# Patient Record
Sex: Female | Born: 1940 | Race: White | Hispanic: No | Marital: Married | Smoking: Never smoker
Health system: Southern US, Community
[De-identification: ages and names within clinical notes are randomized; demographics above are authoritative.]

## PROBLEM LIST (undated history)

## (undated) DIAGNOSIS — T4145XA Adverse effect of unspecified anesthetic, initial encounter: Secondary | ICD-10-CM

## (undated) DIAGNOSIS — Z923 Personal history of irradiation: Secondary | ICD-10-CM

## (undated) DIAGNOSIS — K219 Gastro-esophageal reflux disease without esophagitis: Secondary | ICD-10-CM

## (undated) DIAGNOSIS — M1990 Unspecified osteoarthritis, unspecified site: Secondary | ICD-10-CM

## (undated) DIAGNOSIS — M810 Age-related osteoporosis without current pathological fracture: Secondary | ICD-10-CM

## (undated) DIAGNOSIS — E8940 Asymptomatic postprocedural ovarian failure: Secondary | ICD-10-CM

## (undated) DIAGNOSIS — C50919 Malignant neoplasm of unspecified site of unspecified female breast: Secondary | ICD-10-CM

## (undated) DIAGNOSIS — I509 Heart failure, unspecified: Secondary | ICD-10-CM

## (undated) DIAGNOSIS — I499 Cardiac arrhythmia, unspecified: Secondary | ICD-10-CM

## (undated) DIAGNOSIS — M419 Scoliosis, unspecified: Secondary | ICD-10-CM

## (undated) DIAGNOSIS — C801 Malignant (primary) neoplasm, unspecified: Secondary | ICD-10-CM

## (undated) DIAGNOSIS — Z8719 Personal history of other diseases of the digestive system: Secondary | ICD-10-CM

## (undated) DIAGNOSIS — M8008XA Age-related osteoporosis with current pathological fracture, vertebra(e), initial encounter for fracture: Secondary | ICD-10-CM

## (undated) DIAGNOSIS — Z9889 Other specified postprocedural states: Secondary | ICD-10-CM

## (undated) DIAGNOSIS — I341 Nonrheumatic mitral (valve) prolapse: Secondary | ICD-10-CM

## (undated) DIAGNOSIS — I519 Heart disease, unspecified: Secondary | ICD-10-CM

## (undated) DIAGNOSIS — Z853 Personal history of malignant neoplasm of breast: Secondary | ICD-10-CM

## (undated) DIAGNOSIS — T8859XA Other complications of anesthesia, initial encounter: Secondary | ICD-10-CM

## (undated) DIAGNOSIS — F419 Anxiety disorder, unspecified: Secondary | ICD-10-CM

## (undated) DIAGNOSIS — R0600 Dyspnea, unspecified: Secondary | ICD-10-CM

## (undated) DIAGNOSIS — K635 Polyp of colon: Secondary | ICD-10-CM

## (undated) DIAGNOSIS — R112 Nausea with vomiting, unspecified: Secondary | ICD-10-CM

## (undated) HISTORY — DX: Asymptomatic postprocedural ovarian failure: E89.40

## (undated) HISTORY — DX: Cardiac arrhythmia, unspecified: I49.9

## (undated) HISTORY — PX: NASAL SEPTUM SURGERY: SHX37

## (undated) HISTORY — PX: OTHER SURGICAL HISTORY: SHX169

## (undated) HISTORY — DX: Age-related osteoporosis without current pathological fracture: M81.0

## (undated) HISTORY — DX: Heart disease, unspecified: I51.9

## (undated) HISTORY — PX: TOTAL ABDOMINAL HYSTERECTOMY W/ BILATERAL SALPINGOOPHORECTOMY: SHX83

## (undated) HISTORY — PX: CHOLECYSTECTOMY: SHX55

## (undated) HISTORY — DX: Polyp of colon: K63.5

## (undated) HISTORY — DX: Heart failure, unspecified: I50.9

## (undated) HISTORY — PX: EYE SURGERY: SHX253

## (undated) HISTORY — PX: TONSILLECTOMY: SUR1361

## (undated) HISTORY — PX: ROTATOR CUFF REPAIR: SHX139

## (undated) HISTORY — DX: Personal history of other diseases of the digestive system: Z87.19

## (undated) HISTORY — PX: CATARACT EXTRACTION: SUR2

## (undated) HISTORY — DX: Personal history of malignant neoplasm of breast: Z85.3

---
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
## 1999-12-15 HISTORY — PX: COLONOSCOPY: SHX174

## 2001-04-29 DIAGNOSIS — Z8711 Personal history of peptic ulcer disease: Secondary | ICD-10-CM

## 2001-04-29 HISTORY — DX: Personal history of peptic ulcer disease: Z87.11

## 2003-11-18 ENCOUNTER — Ambulatory Visit: Payer: Self-pay | Admitting: Internal Medicine

## 2004-01-06 ENCOUNTER — Ambulatory Visit: Payer: Self-pay | Admitting: Unknown Physician Specialty

## 2004-01-07 ENCOUNTER — Ambulatory Visit: Payer: Self-pay | Admitting: Unknown Physician Specialty

## 2004-01-10 ENCOUNTER — Ambulatory Visit: Payer: Self-pay | Admitting: Unknown Physician Specialty

## 2004-01-30 DIAGNOSIS — C50919 Malignant neoplasm of unspecified site of unspecified female breast: Secondary | ICD-10-CM

## 2004-01-30 DIAGNOSIS — C801 Malignant (primary) neoplasm, unspecified: Secondary | ICD-10-CM

## 2004-01-30 DIAGNOSIS — Z923 Personal history of irradiation: Secondary | ICD-10-CM

## 2004-01-30 HISTORY — DX: Malignant neoplasm of unspecified site of unspecified female breast: C50.919

## 2004-01-30 HISTORY — PX: BREAST LUMPECTOMY: SHX2

## 2004-01-30 HISTORY — PX: BREAST BIOPSY: SHX20

## 2004-01-30 HISTORY — DX: Personal history of irradiation: Z92.3

## 2004-01-30 HISTORY — DX: Malignant (primary) neoplasm, unspecified: C80.1

## 2004-05-26 ENCOUNTER — Ambulatory Visit: Payer: Self-pay | Admitting: Unknown Physician Specialty

## 2004-06-06 ENCOUNTER — Ambulatory Visit: Payer: Self-pay | Admitting: Unknown Physician Specialty

## 2004-06-15 ENCOUNTER — Ambulatory Visit: Payer: Self-pay | Admitting: Surgery

## 2004-06-15 ENCOUNTER — Other Ambulatory Visit: Payer: Self-pay

## 2004-06-21 ENCOUNTER — Ambulatory Visit: Payer: Self-pay | Admitting: Surgery

## 2004-10-23 ENCOUNTER — Ambulatory Visit: Payer: Self-pay | Admitting: Orthopaedic Surgery

## 2004-11-29 ENCOUNTER — Ambulatory Visit: Payer: Self-pay | Admitting: Unknown Physician Specialty

## 2004-11-29 HISTORY — PX: COLONOSCOPY: SHX174

## 2004-12-05 ENCOUNTER — Ambulatory Visit: Payer: Self-pay | Admitting: Orthopaedic Surgery

## 2004-12-25 ENCOUNTER — Ambulatory Visit: Payer: Self-pay | Admitting: Internal Medicine

## 2005-01-08 ENCOUNTER — Ambulatory Visit: Payer: Self-pay | Admitting: Internal Medicine

## 2005-02-22 ENCOUNTER — Ambulatory Visit: Payer: Self-pay | Admitting: Surgery

## 2005-03-16 ENCOUNTER — Ambulatory Visit: Payer: Self-pay | Admitting: Internal Medicine

## 2005-03-29 ENCOUNTER — Ambulatory Visit: Payer: Self-pay | Admitting: Internal Medicine

## 2005-04-29 ENCOUNTER — Ambulatory Visit: Payer: Self-pay | Admitting: Internal Medicine

## 2005-05-29 ENCOUNTER — Ambulatory Visit: Payer: Self-pay | Admitting: Internal Medicine

## 2005-07-11 ENCOUNTER — Ambulatory Visit: Payer: Self-pay | Admitting: Internal Medicine

## 2005-11-14 ENCOUNTER — Ambulatory Visit: Payer: Self-pay | Admitting: Radiation Oncology

## 2006-01-07 ENCOUNTER — Ambulatory Visit: Payer: Self-pay | Admitting: Internal Medicine

## 2006-01-09 ENCOUNTER — Ambulatory Visit: Payer: Self-pay | Admitting: Surgery

## 2006-01-29 ENCOUNTER — Ambulatory Visit: Payer: Self-pay | Admitting: Internal Medicine

## 2006-06-30 ENCOUNTER — Ambulatory Visit: Payer: Self-pay | Admitting: Internal Medicine

## 2006-07-05 ENCOUNTER — Ambulatory Visit: Payer: Self-pay | Admitting: Internal Medicine

## 2006-07-30 ENCOUNTER — Ambulatory Visit: Payer: Self-pay | Admitting: Internal Medicine

## 2006-11-26 ENCOUNTER — Ambulatory Visit: Payer: Self-pay | Admitting: Unknown Physician Specialty

## 2006-12-24 ENCOUNTER — Ambulatory Visit: Payer: Self-pay | Admitting: Unknown Physician Specialty

## 2006-12-24 HISTORY — PX: ESOPHAGOGASTRODUODENOSCOPY: SHX1529

## 2007-01-27 ENCOUNTER — Ambulatory Visit: Payer: Self-pay | Admitting: Surgery

## 2007-01-30 ENCOUNTER — Ambulatory Visit: Payer: Self-pay | Admitting: Internal Medicine

## 2007-02-14 ENCOUNTER — Ambulatory Visit: Payer: Self-pay | Admitting: Internal Medicine

## 2007-03-02 ENCOUNTER — Ambulatory Visit: Payer: Self-pay | Admitting: Internal Medicine

## 2007-07-30 ENCOUNTER — Ambulatory Visit: Payer: Self-pay | Admitting: Internal Medicine

## 2007-08-15 ENCOUNTER — Ambulatory Visit: Payer: Self-pay | Admitting: Internal Medicine

## 2007-08-30 ENCOUNTER — Ambulatory Visit: Payer: Self-pay | Admitting: Internal Medicine

## 2008-02-03 ENCOUNTER — Ambulatory Visit: Payer: Self-pay | Admitting: Surgery

## 2008-07-29 ENCOUNTER — Ambulatory Visit: Payer: Self-pay | Admitting: Internal Medicine

## 2008-08-13 ENCOUNTER — Ambulatory Visit: Payer: Self-pay | Admitting: Internal Medicine

## 2008-08-29 ENCOUNTER — Ambulatory Visit: Payer: Self-pay | Admitting: Internal Medicine

## 2008-12-21 ENCOUNTER — Ambulatory Visit: Payer: Self-pay | Admitting: Internal Medicine

## 2008-12-29 ENCOUNTER — Ambulatory Visit: Payer: Self-pay | Admitting: Internal Medicine

## 2009-02-07 ENCOUNTER — Ambulatory Visit: Payer: Self-pay | Admitting: Surgery

## 2009-07-29 ENCOUNTER — Ambulatory Visit: Payer: Self-pay | Admitting: Internal Medicine

## 2009-08-12 ENCOUNTER — Ambulatory Visit: Payer: Self-pay | Admitting: Internal Medicine

## 2009-08-29 ENCOUNTER — Ambulatory Visit: Payer: Self-pay | Admitting: Internal Medicine

## 2010-02-08 ENCOUNTER — Ambulatory Visit: Payer: Self-pay | Admitting: Surgery

## 2010-03-14 ENCOUNTER — Ambulatory Visit: Payer: Self-pay | Admitting: Unknown Physician Specialty

## 2010-03-14 HISTORY — PX: COLONOSCOPY: SHX174

## 2010-08-15 ENCOUNTER — Ambulatory Visit: Payer: Self-pay | Admitting: Internal Medicine

## 2010-08-30 ENCOUNTER — Ambulatory Visit: Payer: Self-pay | Admitting: Internal Medicine

## 2011-02-12 ENCOUNTER — Ambulatory Visit: Payer: Self-pay | Admitting: Surgery

## 2012-02-18 ENCOUNTER — Ambulatory Visit: Payer: Self-pay | Admitting: Internal Medicine

## 2012-02-20 ENCOUNTER — Ambulatory Visit: Payer: Self-pay | Admitting: Internal Medicine

## 2012-03-12 ENCOUNTER — Ambulatory Visit: Payer: Self-pay | Admitting: Ophthalmology

## 2012-05-06 ENCOUNTER — Ambulatory Visit: Payer: Self-pay | Admitting: Ophthalmology

## 2013-02-23 ENCOUNTER — Ambulatory Visit: Payer: Self-pay | Admitting: Internal Medicine

## 2013-02-24 ENCOUNTER — Ambulatory Visit: Payer: Self-pay | Admitting: Ophthalmology

## 2013-02-24 LAB — POTASSIUM: Potassium: 3.8 mmol/L (ref 3.5–5.1)

## 2013-03-10 ENCOUNTER — Ambulatory Visit: Payer: Self-pay | Admitting: Ophthalmology

## 2014-03-01 ENCOUNTER — Ambulatory Visit: Payer: Self-pay | Admitting: Unknown Physician Specialty

## 2014-03-11 ENCOUNTER — Ambulatory Visit: Payer: Self-pay | Admitting: Internal Medicine

## 2014-05-21 NOTE — Op Note (Signed)
PATIENT NAME:  Katie Harding, Katie Harding MR#:  [id number] DATE OF BIRTH:  11-07-1940  DATE OF PROCEDURE:  05/06/2012  PREOPERATIVE DIAGNOSIS: Visually significant cataract of the right eye.   POSTOPERATIVE DIAGNOSIS: Visually significant cataract of the right eye.   PROCEDURE PERFORMED: Cataract extraction by phacoemulsification with implant of intraocular lens to the right eye.   SURGEON: Birder Robson, MD   ANESTHESIA: Topical anesthesia.   COMPLICATIONS: None.   PROCEDURE IN DETAIL: The patient was examined, consented for this procedure in the preoperative holding area, and then brought back to the operating room, where the anesthesia team employed topical anesthesia.  The right eye was prepped and draped in the usual sterile ophthalmic fashion.   A side-port blade was used to create a paracentesis, and the anterior chamber was filled with DisCoVisc. A near-clear corneal incision was created, followed by a continuous curvilinear capsulorrhexis using the cystotome and capsulorrhexis forceps. Hydrodissection and hydrodelineation were carried out with BSS on a blunt cannula. The lens was removed in a  4-quadrant chop-and-stop technique. The remaining cortical material was removed with the irrigation-aspiration handpiece. The capsular bag was inflated with the viscoelastic, and the Tecnis ZCB00 21.0-diopter lens, serial #[id number] was placed in the right eye. The remaining viscoelastic was removed from the right eye. Then 0.1 mL of cefuroxime at a concentration if 10 mg/mL was placed in the anterior chamber. The wounds were hydrated and found to be watertight. The right eye was inflated to a physiologic pressure.   The patient was awakened from topical anesthesia without complication and instructed to begin her regimen of drops that afternoon, and follow up with me in one day.    ____________________________ Livingston Diones. Rachit Grim, MD wlp:dm D: 05/06/2012 12:36:00 ET T: 05/06/2012 13:14:55  ET JOB#: [id number]  cc: Lavaris Sexson L. Rekha Hobbins, MD, <Dictator> Livingston Diones Tyshawn Keel MD ELECTRONICALLY SIGNED 05/13/2012 12:51

## 2014-05-22 NOTE — Op Note (Signed)
PATIENT NAME:  Katie Harding, Katie Harding MR#:  [id number] DATE OF BIRTH:  03-30-1940  DATE OF PROCEDURE:  03/10/2013  PREOPERATIVE DIAGNOSIS: Visually significant cataract of the left eye.   POSTOPERATIVE DIAGNOSIS: Visually significant cataract of the left eye.   OPERATIVE PROCEDURE: Cataract extraction by phacoemulsification with implant of intraocular lens to the left eye.   SURGEON: Birder Robson, MD.   ANESTHESIA:  1. Managed anesthesia care.  2. Topical tetracaine drops followed by 2% Xylocaine jelly applied in the preoperative holding area.   COMPLICATIONS: None.   TECHNIQUE:  Stop and chop.  DESCRIPTION OF PROCEDURE: The patient was examined and consented in the preoperative holding area where the aforementioned topical anesthesia was applied to the left eye and then brought back to the Operating Room where the left eye was prepped and draped in the usual sterile ophthalmic fashion and a lid speculum was placed. A paracentesis was created with the side port blade and the anterior chamber was filled with viscoelastic. A near clear corneal incision was performed with the steel keratome. A continuous curvilinear capsulorrhexis was performed with a cystotome followed by the capsulorrhexis forceps. Hydrodissection and hydrodelineation were carried out with BSS on a blunt cannula. The lens was removed in a stop and chop technique and the remaining cortical material was removed with the irrigation-aspiration handpiece. The capsular bag was inflated with viscoelastic and the Tecnis ZCB00 22.0-diopter lens, serial number [id number] was placed in the capsular bag without complication. The remaining viscoelastic was removed from the eye with the irrigation-aspiration handpiece. The wounds were hydrated. The anterior chamber was flushed with Miostat and the eye was inflated to physiologic pressure. 0.1 mL of cefuroxime concentration 10 mg/mL was placed in the anterior chamber. The wounds were found to be  water tight. The eye was dressed with Vigamox. The patient was given protective glasses to wear throughout the day and a shield with which to sleep tonight. The patient was also given drops with which to begin a drop regimen today and will follow-up with me in one day.    ____________________________ Livingston Diones. Zyere Jiminez, MD wlp:dmm D: 03/10/2013 20:52:59 ET T: 03/10/2013 21:27:43 ET JOB#: [id number]  cc: Dagan Heinz L. Jaidon Ellery, MD, <Dictator> Livingston Diones Jaspreet Bodner MD ELECTRONICALLY SIGNED 03/19/2013 11:56

## 2014-07-14 ENCOUNTER — Other Ambulatory Visit: Payer: Self-pay | Admitting: Internal Medicine

## 2014-07-14 DIAGNOSIS — R921 Mammographic calcification found on diagnostic imaging of breast: Secondary | ICD-10-CM

## 2014-09-10 ENCOUNTER — Other Ambulatory Visit: Payer: Self-pay

## 2014-09-13 ENCOUNTER — Ambulatory Visit
Admission: RE | Admit: 2014-09-13 | Discharge: 2014-09-13 | Disposition: A | Discharge status: Home / Self Care | Payer: Medicare Other | Source: Ambulatory Visit | Attending: Internal Medicine | Admitting: Internal Medicine

## 2014-09-13 ENCOUNTER — Ambulatory Visit: Payer: Self-pay

## 2014-09-13 ENCOUNTER — Other Ambulatory Visit: Payer: Self-pay | Admitting: Internal Medicine

## 2014-09-13 DIAGNOSIS — R921 Mammographic calcification found on diagnostic imaging of breast: Secondary | ICD-10-CM | POA: Insufficient documentation

## 2014-09-13 HISTORY — DX: Malignant (primary) neoplasm, unspecified: C80.1

## 2015-02-10 ENCOUNTER — Other Ambulatory Visit: Payer: Self-pay | Admitting: Internal Medicine

## 2015-02-10 DIAGNOSIS — R920 Mammographic microcalcification found on diagnostic imaging of breast: Secondary | ICD-10-CM

## 2015-03-07 ENCOUNTER — Ambulatory Visit: Payer: Medicare Other

## 2015-03-07 ENCOUNTER — Other Ambulatory Visit: Payer: Medicare Other

## 2015-03-09 ENCOUNTER — Ambulatory Visit
Admission: RE | Admit: 2015-03-09 | Discharge: 2015-03-09 | Disposition: A | Discharge status: Home / Self Care | Payer: Medicare Other | Source: Ambulatory Visit | Attending: Internal Medicine | Admitting: Internal Medicine

## 2015-03-09 ENCOUNTER — Other Ambulatory Visit: Payer: Self-pay | Admitting: Internal Medicine

## 2015-03-09 DIAGNOSIS — R920 Mammographic microcalcification found on diagnostic imaging of breast: Secondary | ICD-10-CM

## 2015-03-09 DIAGNOSIS — R921 Mammographic calcification found on diagnostic imaging of breast: Secondary | ICD-10-CM | POA: Insufficient documentation

## 2015-03-09 HISTORY — DX: Malignant neoplasm of unspecified site of unspecified female breast: C50.919

## 2015-04-04 ENCOUNTER — Other Ambulatory Visit: Payer: Self-pay | Admitting: Nurse Practitioner

## 2015-04-04 DIAGNOSIS — K219 Gastro-esophageal reflux disease without esophagitis: Secondary | ICD-10-CM

## 2015-04-04 DIAGNOSIS — E876 Hypokalemia: Secondary | ICD-10-CM | POA: Diagnosis present

## 2015-04-04 DIAGNOSIS — I509 Heart failure, unspecified: Secondary | ICD-10-CM

## 2015-04-04 DIAGNOSIS — R1312 Dysphagia, oropharyngeal phase: Secondary | ICD-10-CM

## 2015-04-04 HISTORY — DX: Gastro-esophageal reflux disease without esophagitis: K21.9

## 2015-04-11 ENCOUNTER — Ambulatory Visit: Payer: Medicare Other

## 2015-04-18 ENCOUNTER — Ambulatory Visit
Admission: RE | Admit: 2015-04-18 | Discharge: 2015-04-18 | Disposition: A | Discharge status: Home / Self Care | Payer: Medicare Other | Source: Ambulatory Visit | Attending: Nurse Practitioner | Admitting: Nurse Practitioner

## 2015-04-18 DIAGNOSIS — R1312 Dysphagia, oropharyngeal phase: Secondary | ICD-10-CM

## 2015-04-18 DIAGNOSIS — K219 Gastro-esophageal reflux disease without esophagitis: Secondary | ICD-10-CM | POA: Diagnosis not present

## 2015-04-18 DIAGNOSIS — R1310 Dysphagia, unspecified: Secondary | ICD-10-CM | POA: Diagnosis not present

## 2015-05-04 ENCOUNTER — Encounter: Payer: Self-pay | Admitting: *Deleted

## 2015-05-05 ENCOUNTER — Encounter
Admission: RE | Disposition: A | Discharge status: Home / Self Care | Payer: Self-pay | Source: Ambulatory Visit | Attending: Unknown Physician Specialty

## 2015-05-05 ENCOUNTER — Ambulatory Visit
Admission: RE | Admit: 2015-05-05 | Discharge: 2015-05-05 | Disposition: A | Discharge status: Home / Self Care | Payer: Medicare Other | Source: Ambulatory Visit | Attending: Unknown Physician Specialty | Admitting: Unknown Physician Specialty

## 2015-05-05 ENCOUNTER — Ambulatory Visit: Payer: Medicare Other | Admitting: Anesthesiology

## 2015-05-05 ENCOUNTER — Encounter: Payer: Self-pay | Admitting: *Deleted

## 2015-05-05 DIAGNOSIS — Z86010 Personal history of colonic polyps: Secondary | ICD-10-CM | POA: Insufficient documentation

## 2015-05-05 DIAGNOSIS — Z9049 Acquired absence of other specified parts of digestive tract: Secondary | ICD-10-CM | POA: Insufficient documentation

## 2015-05-05 DIAGNOSIS — Z853 Personal history of malignant neoplasm of breast: Secondary | ICD-10-CM | POA: Diagnosis not present

## 2015-05-05 DIAGNOSIS — Z79899 Other long term (current) drug therapy: Secondary | ICD-10-CM | POA: Insufficient documentation

## 2015-05-05 DIAGNOSIS — Z823 Family history of stroke: Secondary | ICD-10-CM | POA: Insufficient documentation

## 2015-05-05 DIAGNOSIS — K219 Gastro-esophageal reflux disease without esophagitis: Secondary | ICD-10-CM | POA: Diagnosis present

## 2015-05-05 DIAGNOSIS — K640 First degree hemorrhoids: Secondary | ICD-10-CM | POA: Diagnosis not present

## 2015-05-05 DIAGNOSIS — Z8052 Family history of malignant neoplasm of bladder: Secondary | ICD-10-CM | POA: Insufficient documentation

## 2015-05-05 DIAGNOSIS — Z90710 Acquired absence of both cervix and uterus: Secondary | ICD-10-CM | POA: Insufficient documentation

## 2015-05-05 DIAGNOSIS — Z800 Family history of malignant neoplasm of digestive organs: Secondary | ICD-10-CM | POA: Diagnosis not present

## 2015-05-05 DIAGNOSIS — I341 Nonrheumatic mitral (valve) prolapse: Secondary | ICD-10-CM | POA: Diagnosis not present

## 2015-05-05 DIAGNOSIS — M810 Age-related osteoporosis without current pathological fracture: Secondary | ICD-10-CM | POA: Diagnosis not present

## 2015-05-05 DIAGNOSIS — Z8249 Family history of ischemic heart disease and other diseases of the circulatory system: Secondary | ICD-10-CM | POA: Insufficient documentation

## 2015-05-05 DIAGNOSIS — Z1211 Encounter for screening for malignant neoplasm of colon: Secondary | ICD-10-CM | POA: Diagnosis not present

## 2015-05-05 HISTORY — PX: COLONOSCOPY: SHX174

## 2015-05-05 HISTORY — PX: COLONOSCOPY WITH PROPOFOL: SHX5780

## 2015-05-05 HISTORY — DX: Nonrheumatic mitral (valve) prolapse: I34.1

## 2015-05-05 HISTORY — PX: ESOPHAGOGASTRODUODENOSCOPY (EGD) WITH PROPOFOL: SHX5813

## 2015-05-05 HISTORY — DX: Age-related osteoporosis without current pathological fracture: M81.0

## 2015-05-05 HISTORY — DX: Gastro-esophageal reflux disease without esophagitis: K21.9

## 2015-05-05 SURGERY — ESOPHAGOGASTRODUODENOSCOPY (EGD) WITH PROPOFOL
Anesthesia: General

## 2015-05-05 MED ORDER — PROPOFOL 500 MG/50ML IV EMUL
INTRAVENOUS | Status: DC | PRN
  Administered 2015-05-05: 120 ug/kg/min via INTRAVENOUS

## 2015-05-05 MED ORDER — SODIUM CHLORIDE 0.9 % IV SOLN
INTRAVENOUS | Status: DC
  Administered 2015-05-05: 1000 mL via INTRAVENOUS

## 2015-05-05 MED ORDER — LIDOCAINE HCL (CARDIAC) 20 MG/ML IV SOLN
INTRAVENOUS | Status: DC | PRN
  Administered 2015-05-05: 60 mg via INTRAVENOUS

## 2015-05-05 MED ORDER — SODIUM CHLORIDE 0.9 % IV SOLN: INTRAVENOUS | Status: DC

## 2015-05-05 MED ORDER — PROPOFOL 10 MG/ML IV BOLUS
INTRAVENOUS | Status: DC | PRN
  Administered 2015-05-05: 10 mg via INTRAVENOUS
  Administered 2015-05-05: 30 mg via INTRAVENOUS

## 2015-05-05 NOTE — Anesthesia Preprocedure Evaluation (Signed)
Anesthesia Evaluation  Patient identified by MRN, date of birth, ID band Patient awake    Reviewed: Allergy & Precautions, H&P , NPO status , Patient's Chart, lab work & pertinent test results, reviewed documented beta blocker date and time   Airway Mallampati: I  TM Distance: >3 FB Neck ROM: full    Dental no notable dental hx. (+) Caps, Missing   Pulmonary neg pulmonary ROS,    Pulmonary exam normal breath sounds clear to auscultation       Cardiovascular Exercise Tolerance: Good (-) hypertension(-) angina(-) CAD, (-) Past MI, (-) Cardiac Stents and (-) CABG Normal cardiovascular exam+ dysrhythmias + Valvular Problems/Murmurs MVP  Rhythm:regular Rate:Normal     Neuro/Psych negative neurological ROS  negative psych ROS   GI/Hepatic Neg liver ROS, GERD  ,  Endo/Other  negative endocrine ROS  Renal/GU negative Renal ROS  negative genitourinary   Musculoskeletal   Abdominal   Peds  Hematology negative hematology ROS (+)   Anesthesia Other Findings Past Medical History:   [id number]                                         2006         Breast cancer ([place])                             2006           Comment:right breast with radiation   GERD (gastroesophageal reflux disease)                       MVP (mitral valve prolapse)                                  Osteoporosis                                                 Reproductive/Obstetrics negative OB ROS                             Anesthesia Physical Anesthesia Plan  ASA: II  Anesthesia Plan: General   Post-op Pain Management:    Induction:   Airway Management Planned:   Additional Equipment:   Intra-op Plan:   Post-operative Plan:   Informed Consent: I have reviewed the patients History and Physical, chart, labs and discussed the procedure including the risks, benefits and alternatives for the proposed anesthesia with the patient  or authorized representative who has indicated his/her understanding and acceptance.   Dental Advisory Given  Plan Discussed with: Anesthesiologist, CRNA and Surgeon  Anesthesia Plan Comments:         Anesthesia Quick Evaluation

## 2015-05-05 NOTE — Transfer of Care (Signed)
Immediate Anesthesia Transfer of Care Note  Patient: Katie Harding  Procedure(s) Performed: Procedure(s): ESOPHAGOGASTRODUODENOSCOPY (EGD) WITH PROPOFOL (N/A) COLONOSCOPY WITH PROPOFOL (N/A)  Patient Location: PACU  Anesthesia Type:General  Level of Consciousness: sedated  Airway & Oxygen Therapy: Patient Spontanous Breathing and Patient connected to nasal cannula oxygen  Post-op Assessment: Report given to RN  Post vital signs: Reviewed and stable  Last Vitals:  Filed Vitals:   05/05/15 1358 05/05/15 1528  BP: 152/69 136/73  Pulse: 61 56  Temp: 36.1 C 96.69F  Resp: 20 17    Complications: No apparent anesthesia complications

## 2015-05-05 NOTE — H&P (Signed)
Primary Care Physician:  Rusty Aus, MD Primary Gastroenterologist:  Dr. Vira Agar  Pre-Procedure History & Physical: HPI:  Katie Harding is a 75 y.o. female is here for an colonoscopy.   Past Medical History  Diagnosis Date  . #[id number] 2006  . Breast cancer [facility]) 2006    right breast with radiation  . GERD (gastroesophageal reflux disease)   . MVP (mitral valve prolapse)   . Osteoporosis     Past Surgical History  Procedure Laterality Date  . Breast excisional biopsy Right 2006    breast ca  . Abdominal hysterectomy    . Eye surgery      cataracts removed  . Ovarian cyst removed    . Tonsillectomy    . Colonoscopy    . Cholecystectomy    . Rotator cuff repair    . Nasal septum surgery      Prior to Admission medications   Medication Sig Start Date End Date Taking? Authorizing Provider  calcium citrate-vitamin D (CITRACAL+D) 315-200 MG-UNIT tablet Take 1 tablet by mouth 2 (two) times daily.   Yes Historical Provider, MD  Cholecalciferol (VITAMIN D3) 2000 units TABS Take by mouth.   Yes Historical Provider, MD  docusate sodium (COLACE) 100 MG capsule Take 100 mg by mouth 2 (two) times daily.   Yes Historical Provider, MD  MAGNESIUM OXIDE, ANTACID, PO Take 300 mg by mouth 2 (two) times daily.   Yes Historical Provider, MD  metoCLOPramide (REGLAN) 5 MG tablet Take 5 mg by mouth 4 (four) times daily.   Yes Historical Provider, MD  metoprolol succinate (TOPROL XL) 25 MG 24 hr tablet Take 25 mg by mouth daily.   Yes Historical Provider, MD  Misc Natural Products (GLUCOSAMINE CHOND MSM FORMULA PO) Take by mouth.   Yes Historical Provider, MD  Multiple Vitamin (MULTIVITAMIN) tablet Take 1 tablet by mouth daily.   Yes Historical Provider, MD  omeprazole (PRILOSEC) 20 MG capsule Take 20 mg by mouth daily.   Yes Historical Provider, MD  UBIQUINONE PO Take 200 mg by mouth every morning.   Yes Historical Provider, MD    Allergies as of 04/28/2015  . (Not on File)    Family  History  Problem Relation Age of Onset  . Breast cancer Sister 41  . Multiple myeloma Sister   . Breast cancer Maternal Aunt 60  . Breast cancer Maternal Aunt 60  . Colon cancer Brother     Social History   Social History  . Marital Status: Married    Spouse Name: N/A  . Number of Children: N/A  . Years of Education: N/A   Occupational History  . Not on file.   Social History Main Topics  . Smoking status: Never Smoker   . Smokeless tobacco: Not on file  . Alcohol Use: No  . Drug Use: No  . Sexual Activity: Not on file   Other Topics Concern  . Not on file   Social History Narrative    Review of Systems: See HPI, otherwise negative ROS  Physical Exam: BP 152/69 mmHg  Pulse 61  Temp(Src) 96.9 F (36.1 C) (Tympanic)  Resp 20  Ht 5' 7.5" (1.715 m)  Wt 49.896 kg (110 lb)  BMI 16.96 kg/m2  SpO2 100% General:   Alert,  pleasant and cooperative in NAD Head:  Normocephalic and atraumatic. Neck:  Supple; no masses or thyromegaly. Lungs:  Clear throughout to auscultation.    Heart:  Regular rate and rhythm. Abdomen:  Soft, nontender and nondistended. Normal bowel sounds, without guarding, and without rebound.   Neurologic:  Alert and  oriented x4;  grossly normal neurologically.  Impression/Plan: Katie Harding is here for an colonoscopy to be performed for [facility] colon polyps  Risks, benefits, limitations, and alternatives regarding  colonoscopy have been reviewed with the patient.  Questions have been answered.  All parties agreeable.   Gaylyn Cheers, MD  05/05/2015, 2:26 PM

## 2015-05-05 NOTE — Op Note (Signed)
[facility] Patient Name: Katie Harding Procedure Date: 05/05/2015 2:38 PM MRN: [id number] Account #: [id number] Date of Birth: August 27, 1940 Admit Type: Outpatient Age: 75 Room: [facility] ENDO ROOM 1 Gender: Female Note Status: Finalized Procedure:            Colonoscopy Indications:          High risk colon cancer surveillance: Personal history                        of colonic polyps Providers:            Manya Silvas, MD Referring MD:         Rusty Aus, MD (Referring MD) Medicines:            Propofol per Anesthesia Complications:        No immediate complications. Procedure:            Pre-Anesthesia Assessment:                       - After reviewing the risks and benefits, the patient                        was deemed in satisfactory condition to undergo the                        procedure.                       After obtaining informed consent, the colonoscope was                        passed under direct vision. Throughout the procedure,                        the patient's blood pressure, pulse, and oxygen                        saturations were monitored continuously. The                        Colonoscope was introduced through the anus and                        advanced to the the cecum, identified by appendiceal                        orifice and ileocecal valve. The colonoscopy was                        performed without difficulty. The patient tolerated the                        procedure well. The quality of the bowel preparation                        was good. Findings:      Internal hemorrhoids were found during endoscopy. The hemorrhoids were       small and Grade I (internal hemorrhoids that do not prolapse).      The colon was very difficult and tortuous requiring much manuvering.      The exam was  otherwise without abnormality. Impression:           - Internal hemorrhoids.                       - The examination  was otherwise normal.                       - No specimens collected. Recommendation:       - The findings and recommendations were discussed with                        the patient's family. Repeat not recommended. Manya Silvas, MD 05/05/2015 3:21:04 PM This report has been signed electronically. Number of Addenda: 0 Note Initiated On: 05/05/2015 2:38 PM Scope Withdrawal Time: 0 hours 10 minutes 43 seconds  Total Procedure Duration: 0 hours 33 minutes 15 seconds       [facility]

## 2015-05-10 ENCOUNTER — Encounter: Payer: Self-pay | Admitting: Unknown Physician Specialty

## 2015-05-10 NOTE — Anesthesia Postprocedure Evaluation (Signed)
Anesthesia Post Note  Patient: Katie Harding  Procedure(s) Performed: Procedure(s) (LRB): ESOPHAGOGASTRODUODENOSCOPY (EGD) WITH PROPOFOL (N/A) COLONOSCOPY WITH PROPOFOL (N/A)  Patient location during evaluation: Endoscopy Anesthesia Type: General Level of consciousness: awake and alert Pain management: pain level controlled Vital Signs Assessment: post-procedure vital signs reviewed and stable Respiratory status: spontaneous breathing, nonlabored ventilation, respiratory function stable and patient connected to nasal cannula oxygen Cardiovascular status: blood pressure returned to baseline and stable Postop Assessment: no signs of nausea or vomiting Anesthetic complications: no    Last Vitals:  Filed Vitals:   05/05/15 1546 05/05/15 1556  BP: 150/90 159/96  Pulse: 57 58  Temp:    Resp: 19 10    Last Pain:  Filed Vitals:   05/06/15 0750  PainSc: 0-No pain                 Martha Clan

## 2015-12-28 ENCOUNTER — Other Ambulatory Visit: Payer: Self-pay | Admitting: Internal Medicine

## 2015-12-28 DIAGNOSIS — R921 Mammographic calcification found on diagnostic imaging of breast: Secondary | ICD-10-CM

## 2016-03-09 ENCOUNTER — Other Ambulatory Visit: Payer: Medicare Other

## 2016-03-09 ENCOUNTER — Ambulatory Visit: Payer: Medicare Other

## 2016-03-21 ENCOUNTER — Ambulatory Visit
Admission: RE | Admit: 2016-03-21 | Discharge: 2016-03-21 | Disposition: A | Discharge status: Home / Self Care | Payer: Medicare Other | Source: Ambulatory Visit | Attending: Internal Medicine | Admitting: Internal Medicine

## 2016-03-21 ENCOUNTER — Other Ambulatory Visit: Payer: Self-pay | Admitting: Internal Medicine

## 2016-03-21 DIAGNOSIS — Q056 Thoracic spina bifida without hydrocephalus: Secondary | ICD-10-CM | POA: Diagnosis not present

## 2016-03-21 DIAGNOSIS — R079 Chest pain, unspecified: Secondary | ICD-10-CM | POA: Insufficient documentation

## 2016-03-21 DIAGNOSIS — R0602 Shortness of breath: Secondary | ICD-10-CM | POA: Diagnosis not present

## 2016-03-21 DIAGNOSIS — K7689 Other specified diseases of liver: Secondary | ICD-10-CM | POA: Insufficient documentation

## 2016-03-21 DIAGNOSIS — R918 Other nonspecific abnormal finding of lung field: Secondary | ICD-10-CM | POA: Insufficient documentation

## 2016-03-21 DIAGNOSIS — E0789 Other specified disorders of thyroid: Secondary | ICD-10-CM | POA: Insufficient documentation

## 2016-03-21 DIAGNOSIS — Z853 Personal history of malignant neoplasm of breast: Secondary | ICD-10-CM | POA: Diagnosis not present

## 2016-03-21 MED ORDER — IOPAMIDOL (ISOVUE-370) INJECTION 76%
75.0000 mL | Freq: Once | INTRAVENOUS | Status: AC | PRN
  Administered 2016-03-21: 75 mL via INTRAVENOUS

## 2016-03-26 ENCOUNTER — Ambulatory Visit
Admission: RE | Admit: 2016-03-26 | Discharge: 2016-03-26 | Disposition: A | Discharge status: Home / Self Care | Payer: Medicare Other | Source: Ambulatory Visit | Attending: Internal Medicine | Admitting: Internal Medicine

## 2016-03-26 ENCOUNTER — Other Ambulatory Visit: Payer: Self-pay | Admitting: Internal Medicine

## 2016-03-26 DIAGNOSIS — R921 Mammographic calcification found on diagnostic imaging of breast: Secondary | ICD-10-CM

## 2016-03-26 DIAGNOSIS — Z923 Personal history of irradiation: Secondary | ICD-10-CM | POA: Insufficient documentation

## 2016-03-26 DIAGNOSIS — Z853 Personal history of malignant neoplasm of breast: Secondary | ICD-10-CM | POA: Diagnosis not present

## 2016-08-20 ENCOUNTER — Emergency Department: Payer: Medicare Other

## 2016-08-20 ENCOUNTER — Observation Stay
Admission: EM | Admit: 2016-08-20 | Discharge: 2016-08-22 | Disposition: A | Discharge status: Skilled Nursing Facility | Payer: Medicare Other | Attending: Internal Medicine | Admitting: Internal Medicine

## 2016-08-20 ENCOUNTER — Observation Stay: Payer: Medicare Other

## 2016-08-20 ENCOUNTER — Encounter: Payer: Self-pay | Admitting: Emergency Medicine

## 2016-08-20 DIAGNOSIS — E871 Hypo-osmolality and hyponatremia: Secondary | ICD-10-CM | POA: Insufficient documentation

## 2016-08-20 DIAGNOSIS — I10 Essential (primary) hypertension: Secondary | ICD-10-CM | POA: Insufficient documentation

## 2016-08-20 DIAGNOSIS — W11XXXA Fall on and from ladder, initial encounter: Secondary | ICD-10-CM | POA: Diagnosis not present

## 2016-08-20 DIAGNOSIS — Z7952 Long term (current) use of systemic steroids: Secondary | ICD-10-CM | POA: Diagnosis not present

## 2016-08-20 DIAGNOSIS — F419 Anxiety disorder, unspecified: Secondary | ICD-10-CM | POA: Insufficient documentation

## 2016-08-20 DIAGNOSIS — S3210XA Unspecified fracture of sacrum, initial encounter for closed fracture: Secondary | ICD-10-CM | POA: Insufficient documentation

## 2016-08-20 DIAGNOSIS — R339 Retention of urine, unspecified: Secondary | ICD-10-CM

## 2016-08-20 DIAGNOSIS — S32502A Unspecified fracture of left pubis, initial encounter for closed fracture: Principal | ICD-10-CM | POA: Insufficient documentation

## 2016-08-20 DIAGNOSIS — M549 Dorsalgia, unspecified: Secondary | ICD-10-CM

## 2016-08-20 DIAGNOSIS — Z79899 Other long term (current) drug therapy: Secondary | ICD-10-CM | POA: Diagnosis not present

## 2016-08-20 DIAGNOSIS — K5900 Constipation, unspecified: Secondary | ICD-10-CM | POA: Insufficient documentation

## 2016-08-20 DIAGNOSIS — M810 Age-related osteoporosis without current pathological fracture: Secondary | ICD-10-CM | POA: Insufficient documentation

## 2016-08-20 DIAGNOSIS — Z853 Personal history of malignant neoplasm of breast: Secondary | ICD-10-CM | POA: Diagnosis not present

## 2016-08-20 DIAGNOSIS — S32599A Other specified fracture of unspecified pubis, initial encounter for closed fracture: Secondary | ICD-10-CM

## 2016-08-20 DIAGNOSIS — Z66 Do not resuscitate: Secondary | ICD-10-CM | POA: Diagnosis not present

## 2016-08-20 HISTORY — DX: Anxiety disorder, unspecified: F41.9

## 2016-08-20 LAB — CREATININE, SERUM
Creatinine, Ser: 0.33 mg/dL — ABNORMAL LOW (ref 0.44–1.00)
GFR calc Af Amer: >60 mL/min (ref >60)
GFR calc non Af Amer: >60 mL/min (ref >60)

## 2016-08-20 LAB — CBC
HEMATOCRIT: 37.2 % (ref 35.0–47.0)
HEMOGLOBIN: 13 g/dL (ref 12.0–16.0)
MCH: 31.1 pg (ref 26.0–34.0)
MCHC: 35 g/dL (ref 32.0–36.0)
MCV: 88.8 fL (ref 80.0–100.0)
Platelets: 259 10*3/uL (ref 150–440)
RBC: 4.19 MIL/uL (ref 3.80–5.20)
RDW: 13.1 % (ref 11.5–14.5)
WBC: 9.6 10*3/uL (ref 3.6–11.0)

## 2016-08-20 MED ORDER — METOPROLOL SUCCINATE ER 50 MG PO TB24: 25.0000 mg | ORAL_TABLET | ORAL | Status: DC

## 2016-08-20 MED ORDER — OXYCODONE HCL 5 MG PO TABS
5.0000 mg | ORAL_TABLET | ORAL | Status: DC | PRN
  Administered 2016-08-20 – 2016-08-22 (×7): 5 mg via ORAL
  Filled 2016-08-20 (×8): qty 1

## 2016-08-20 MED ORDER — BISACODYL 5 MG PO TBEC
5.0000 mg | DELAYED_RELEASE_TABLET | Freq: Every day | ORAL | Status: DC | PRN
  Administered 2016-08-21: 5 mg via ORAL
  Filled 2016-08-20 (×2): qty 1

## 2016-08-20 MED ORDER — ADULT MULTIVITAMIN W/MINERALS CH
1.0000 | ORAL_TABLET | Freq: Every day | ORAL | Status: DC
  Administered 2016-08-20 – 2016-08-22 (×3): 1 via ORAL
  Filled 2016-08-20 (×3): qty 1

## 2016-08-20 MED ORDER — FENTANYL CITRATE (PF) 100 MCG/2ML IJ SOLN
25.0000 ug | Freq: Once | INTRAMUSCULAR | Status: AC
  Administered 2016-08-20: 25 ug via INTRAVENOUS
  Filled 2016-08-20: qty 2

## 2016-08-20 MED ORDER — ONDANSETRON HCL 4 MG/2ML IJ SOLN: 4.0000 mg | Freq: Four times a day (QID) | INTRAMUSCULAR | Status: DC | PRN

## 2016-08-20 MED ORDER — CALCIUM CITRATE-VITAMIN D 315-200 MG-UNIT PO TABS: 2.0000 | ORAL_TABLET | Freq: Two times a day (BID) | ORAL | Status: DC

## 2016-08-20 MED ORDER — ALPRAZOLAM 0.25 MG PO TABS
0.1250 mg | ORAL_TABLET | Freq: Four times a day (QID) | ORAL | Status: DC
  Administered 2016-08-20 – 2016-08-22 (×7): 0.125 mg via ORAL
  Filled 2016-08-20 (×6): qty 1

## 2016-08-20 MED ORDER — METAXALONE 800 MG PO TABS
800.0000 mg | ORAL_TABLET | Freq: Three times a day (TID) | ORAL | Status: DC | PRN
  Administered 2016-08-21: 800 mg via ORAL
  Filled 2016-08-20 (×2): qty 1

## 2016-08-20 MED ORDER — METOPROLOL TARTRATE 50 MG PO TABS: 50.0000 mg | ORAL_TABLET | Freq: Every day | ORAL | Status: DC

## 2016-08-20 MED ORDER — ALPRAZOLAM 0.5 MG PO TABS
0.5000 mg | ORAL_TABLET | Freq: Once | ORAL | Status: AC
  Administered 2016-08-20: 0.5 mg via ORAL
  Filled 2016-08-20: qty 1

## 2016-08-20 MED ORDER — PANTOPRAZOLE SODIUM 40 MG PO TBEC
40.0000 mg | DELAYED_RELEASE_TABLET | Freq: Every day | ORAL | Status: DC
  Administered 2016-08-21 – 2016-08-22 (×2): 40 mg via ORAL
  Filled 2016-08-20 (×2): qty 1

## 2016-08-20 MED ORDER — ONDANSETRON HCL 4 MG PO TABS: 4.0000 mg | ORAL_TABLET | Freq: Four times a day (QID) | ORAL | Status: DC | PRN

## 2016-08-20 MED ORDER — MAGNESIUM OXIDE 400 (241.3 MG) MG PO TABS
400.0000 mg | ORAL_TABLET | Freq: Two times a day (BID) | ORAL | Status: DC
  Administered 2016-08-20 – 2016-08-22 (×5): 400 mg via ORAL
  Filled 2016-08-20 (×5): qty 1

## 2016-08-20 MED ORDER — ALPRAZOLAM 0.5 MG PO TABS
0.5000 mg | ORAL_TABLET | Freq: Once | ORAL | Status: DC
  Filled 2016-08-20: qty 1

## 2016-08-20 MED ORDER — DOCUSATE SODIUM 100 MG PO CAPS
100.0000 mg | ORAL_CAPSULE | Freq: Two times a day (BID) | ORAL | Status: DC
  Administered 2016-08-20 – 2016-08-22 (×5): 100 mg via ORAL
  Filled 2016-08-20 (×5): qty 1

## 2016-08-20 MED ORDER — METOPROLOL TARTRATE 25 MG PO TABS: 25.0000 mg | ORAL_TABLET | Freq: Every day | ORAL | Status: DC

## 2016-08-20 MED ORDER — FLEET ENEMA 7-19 GM/118ML RE ENEM: 1.0000 | ENEMA | Freq: Every day | RECTAL | Status: DC | PRN

## 2016-08-20 MED ORDER — METOCLOPRAMIDE HCL 10 MG PO TABS
5.0000 mg | ORAL_TABLET | Freq: Every day | ORAL | Status: DC
  Administered 2016-08-20 – 2016-08-21 (×2): 5 mg via ORAL
  Filled 2016-08-20 (×2): qty 1

## 2016-08-20 MED ORDER — VITAMIN D 1000 UNITS PO TABS
2000.0000 [IU] | ORAL_TABLET | Freq: Every day | ORAL | Status: DC
  Administered 2016-08-20 – 2016-08-22 (×3): 2000 [IU] via ORAL
  Filled 2016-08-20 (×4): qty 2

## 2016-08-20 MED ORDER — CALCIUM CARBONATE-VITAMIN D 500-200 MG-UNIT PO TABS
1.0000 | ORAL_TABLET | Freq: Two times a day (BID) | ORAL | Status: DC
  Administered 2016-08-20 – 2016-08-22 (×4): 1 via ORAL
  Filled 2016-08-20 (×4): qty 1

## 2016-08-20 MED ORDER — DIFLUNISAL 500 MG PO TABS: 500.0000 mg | ORAL_TABLET | Freq: Two times a day (BID) | ORAL | Status: DC

## 2016-08-20 MED ORDER — ACETAMINOPHEN 325 MG PO TABS: 650.0000 mg | ORAL_TABLET | Freq: Four times a day (QID) | ORAL | Status: DC | PRN

## 2016-08-20 MED ORDER — IBUPROFEN 400 MG PO TABS
400.0000 mg | ORAL_TABLET | Freq: Two times a day (BID) | ORAL | Status: DC
  Administered 2016-08-20 – 2016-08-22 (×4): 400 mg via ORAL
  Filled 2016-08-20 (×4): qty 1

## 2016-08-20 MED ORDER — ACETAMINOPHEN 500 MG PO TABS
1000.0000 mg | ORAL_TABLET | Freq: Once | ORAL | Status: AC
  Administered 2016-08-20: 1000 mg via ORAL
  Filled 2016-08-20: qty 2

## 2016-08-20 MED ORDER — ENOXAPARIN SODIUM 40 MG/0.4ML ~~LOC~~ SOLN
40.0000 mg | SUBCUTANEOUS | Status: DC
  Administered 2016-08-20 – 2016-08-21 (×2): 40 mg via SUBCUTANEOUS
  Filled 2016-08-20 (×2): qty 0.4

## 2016-08-20 MED ORDER — MORPHINE SULFATE (PF) 2 MG/ML IV SOLN
2.0000 mg | Freq: Once | INTRAVENOUS | Status: AC
  Administered 2016-08-20: 2 mg via INTRAVENOUS
  Filled 2016-08-20: qty 1

## 2016-08-20 MED ORDER — ACETAMINOPHEN 650 MG RE SUPP: 650.0000 mg | Freq: Four times a day (QID) | RECTAL | Status: DC | PRN

## 2016-08-20 MED ORDER — POLYETHYLENE GLYCOL 3350 17 G PO PACK
17.0000 g | PACK | Freq: Every day | ORAL | Status: DC
  Administered 2016-08-20 – 2016-08-22 (×3): 17 g via ORAL
  Filled 2016-08-20 (×3): qty 1

## 2016-08-20 MED ORDER — MAGNESIUM OXIDE (ANTACID) 500 MG PO CAPS: 300.0000 mg | ORAL_CAPSULE | Freq: Two times a day (BID) | ORAL | Status: DC

## 2016-08-20 MED ORDER — METOPROLOL SUCCINATE ER 50 MG PO TB24
50.0000 mg | ORAL_TABLET | ORAL | Status: DC
  Administered 2016-08-20 – 2016-08-22 (×5): 50 mg via ORAL
  Filled 2016-08-20 (×5): qty 1

## 2016-08-20 NOTE — Consult Note (Signed)
ORTHOPAEDIC CONSULTATION  PATIENT NAME: Katie Harding DOB: 12-17-1940  MRN: [id number]  REQUESTING PHYSICIAN: Loletha Grayer, MD  Chief Complaint: Left hip and leg pain  HPI: Katie Harding is a 76 y.o. female who sustained a fall approximately 5 weeks ago while coming off of a stepladder. She apparently landed on her bottom and had some low back and right hip pain. The pain apparently resolved with the use of anti-inflammatory medications and activity modification. Approximately 1 week ago she noted the onset of left hip pain with some pain extending into the left leg. She was evaluated by a physician's assistant and based on a 5 day course of prednisone. The pain intensified and she was having progressive difficulty with walking or sitting. She presented to [facility] acute care and was told that plain radiographs were negative at that time. She was given medication for muscle spasms and pain. She denies any gross numbness, causalgia, or weakness. She did state that over the last 2 days she has had some difficulty with urinating due to the severity of the pain when she is sitting on a commode seat.  The patient has had some muscle spasms to both legs over the last week. Sitting or standing aggravates the pain. She has been using her recliner at home and limiting her activities so as to control the pain. She denies any groin pain. She localizes the most severe pain along the sacral region, left greater than right.  Past Medical History:  Diagnosis Date  . #[id number] 2006  . Anxiety   . Breast cancer [facility]) 2006   right breast lumpectomy with radiation  . GERD (gastroesophageal reflux disease)   . MVP (mitral valve prolapse)   . Osteoporosis    Past Surgical History:  Procedure Laterality Date  . ABDOMINAL HYSTERECTOMY    . BREAST EXCISIONAL BIOPSY Right 2006   breast ca  . CHOLECYSTECTOMY    . COLONOSCOPY    . COLONOSCOPY WITH PROPOFOL N/A 05/05/2015   Procedure: COLONOSCOPY WITH PROPOFOL;   Surgeon: Manya Silvas, MD;  Location: [facility] ENDOSCOPY;  Service: Endoscopy;  Laterality: N/A;  . ESOPHAGOGASTRODUODENOSCOPY (EGD) WITH PROPOFOL N/A 05/05/2015   Procedure: ESOPHAGOGASTRODUODENOSCOPY (EGD) WITH PROPOFOL;  Surgeon: Manya Silvas, MD;  Location: [facility] ENDOSCOPY;  Service: Endoscopy;  Laterality: N/A;  . EYE SURGERY     cataracts removed  . NASAL SEPTUM SURGERY    . ovarian cyst removed    . ROTATOR CUFF REPAIR    . TONSILLECTOMY     Social History   Social History  . Marital status: Married    Spouse name: N/A  . Number of children: N/A  . Years of education: N/A   Social History Main Topics  . Smoking status: Never Smoker  . Smokeless tobacco: Never Used  . Alcohol use No  . Drug use: No  . Sexual activity: Not Asked   Other Topics Concern  . None   Social History Narrative  . None   Family History  Problem Relation Age of Onset  . Breast cancer Sister 23  . Multiple myeloma Sister   . Breast cancer Maternal Aunt 60  . Breast cancer Maternal Aunt 60  . Colon cancer Brother   . Intracerebral hemorrhage Mother   . CVA Mother   . Hypertension Mother    No Known Allergies Prior to Admission medications   Medication Sig Start Date End Date Taking? Authorizing Provider  ALPRAZolam (XANAX) 0.25 MG tablet Take 0.125 mg by mouth  4 (four) times daily.   Yes [provider]  amoxicillin (AMOXIL) 500 MG capsule Take 500 mg by mouth 4 (four) times daily. 08/17/16 08/25/16 Yes [provider]  calcium citrate-vitamin D (CITRACAL+D) 315-200 MG-UNIT tablet Take 2 tablets by mouth 2 (two) times daily.    Yes [provider]  Cholecalciferol (VITAMIN D3) 2000 units TABS Take 2,000 Units by mouth daily.    Yes [provider]  co-enzyme Q-10 30 MG capsule Take 30 mg by mouth daily.   Yes [provider]  cyclobenzaprine (FLEXERIL) 10 MG tablet Take 10 mg by mouth 3 (three) times daily as needed for muscle spasms.   Yes  [provider]  diflunisal (DOLOBID) 500 MG TABS tablet Take 500 mg by mouth 2 (two) times daily.   Yes [provider]  docusate sodium (COLACE) 100 MG capsule Take 100 mg by mouth 2 (two) times daily.   Yes [provider]  MAGNESIUM OXIDE, ANTACID, PO Take 300 mg by mouth 2 (two) times daily.   Yes [provider]  metoCLOPramide (REGLAN) 5 MG tablet Take 5 mg by mouth at bedtime.    Yes [provider]  metoprolol succinate (TOPROL XL) 50 MG 24 hr tablet Take 50 mg by mouth 3 (three) times daily. Take 58m qam, 50 mg at lunch, 2108mwith dinner, and 25 mg qhs   Yes [provider]  Misc Natural Products ([place]SM FORMULA PO) Take 1 tablet by mouth daily.    Yes [provider]  Multiple Vitamin (MULTIVITAMIN) tablet Take 1 tablet by mouth daily.   Yes [provider]  omeprazole (PRILOSEC) 40 MG capsule Take 40 mg by mouth 2 (two) times daily before a meal.    Yes [provider]  predniSONE (DELTASONE) 5 MG tablet Take 5 mg by mouth daily with breakfast. 08/17/16 08/22/16 Yes [provider]   Ct Lumbar Spine Wo Contrast  Result Date: 08/20/2016 CLINICAL DATA:  Progressive left hip and leg pain beginning 4 days ago. Fall 5 weeks ago with right hip pain which has subsequently resolved. Unable ambulate. EXAM: CT LUMBAR SPINE WITHOUT CONTRAST TECHNIQUE: Multidetector CT imaging of the lumbar spine was performed without intravenous contrast administration. Multiplanar CT image reconstructions were also generated. COMPARISON:  CT of the abdomen pelvis 11/26/2006. FINDINGS: Segmentation: 5 non rib-bearing lumbar type vertebral bodies are present. Alignment: Grade 1 anterolisthesis is present at L4-5 and at L5-S1. Anterolisthesis at L5-S1 measures 7 mm in the midline. Mild rightward curvature is present in the lower lumbar spine. Vertebrae: Schmorl's nodes are present at L3-4. A chronic left-sided pars  defect is noted at L5. Bilateral sacral ala fractures are present with minimal displacement. Paraspinal and other soft tissues: Atherosclerotic calcifications are present within the aorta without aneurysm. Cholecystectomy is noted. The urinary bladder is moderately distended. Diverticular changes are present in the sigmoid colon without focal inflammation to suggest diverticulitis. Disc levels: L1-2: Negative. L2-3:  Negative. L3-4:  Negative. L4-5: Mild disc bulging is present. Moderate facet hypertrophy bilaterally contributes to the slight anterolisthesis. L5-S1: Left-sided pars defect is noted. Mild facet hypertrophy is worse on the right. No focal lytic or blastic lesions are present. IMPRESSION: 1. Bilateral acute/subacute sacral insufficiency fractures. 2. 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 3. Slight anterolisthesis at L4-5 secondary to facet degenerative change. 4. Endplate changes at L3A6-35. Atherosclerosis. 6. Sigmoid diverticulosis without diverticulitis. Electronically Signed   By: ChSan Morelle  M.D.   On: 08/20/2016 11:51   Ct Pelvis Wo Contrast  Result Date: 08/20/2016 CLINICAL DATA:  76 year old female with increasing left hip and leg pain starting 4 days ago. Fell 5 weeks ago with right hip pain which resolved. Initial encounter. EXAM: CT PELVIS WITHOUT CONTRAST TECHNIQUE: Multidetector CT imaging of the pelvis was performed following the standard protocol without intravenous contrast. COMPARISON:  11/26/2006 CT. FINDINGS: Urinary Tract: Noncontrast filled prominent size urinary bladder without obvious abnormality. Inferior aspect of the kidneys with the slight prominence renal pelvis. This may be related to the incomplete emptying of the urinary bladder. Bowel:  Sigmoid diverticulosis. Vascular/Lymphatic: Calcified slightly ectatic iliac arteries. No adenopathy. Reproductive:  Post hysterectomy. Other:  Third spacing of fluid. Musculoskeletal: Fracture of  the left pubic body junction with the inferior and superior left pubic ramus. Some surrounding callus with incomplete healing. Broad fracture of the left and right sacrum with surrounding sclerosis. Mild angulation the anterior margin of the fractured S2 vertebra. Chronic L5 pedicle pars defect with minimal anterior slip L5. Schmorl's node deformity superior endplate L4. IMPRESSION: Fracture of the left pubic body junction with the inferior and superior left pubic ramus. Some surrounding callus with incomplete healing. Broad fracture of the left and right sacrum with surrounding sclerosis. Mild angulation the anterior margin of the fractured S2 vertebra. No hip or acetabular fracture detected. Chronic L5 pedicle pars defect with minimal anterior slip L5. Chronic Schmorl's node deformity superior endplate L4. Third spacing of fluid. Fullness of the urinary bladder may contribute to the minimal fullness of the renal collecting system bilaterally. Electronically Signed   By: Genia Del M.D.   On: 08/20/2016 11:52    Positive ROS: All other systems have been reviewed and were otherwise negative with the exception of those mentioned in the HPI and as above.  Physical Exam: General: Well developed, well nourished female seen in no acute distress. HEENT: Atraumatic and normocephalic. Sclera are clear. Extraocular motion is intact. Oropharynx is clear with moist mucosa. Neck: Supple, nontender, good range of motion. No JVD or carotid bruits. Lungs: Clear to auscultation bilaterally. Cardiovascular: Regular rate and rhythm with normal S1 and S2. No appreciable murmur. No gallops or rubs. Pedal pulses are palpable bilaterally. Homans test is negative bilaterally. No significant pretibial or ankle edema. Abdomen: Soft, nontender, and nondistended. Bowel sounds are present. Skin: No lesions in the area of chief complaint Neurologic: Awake, alert, and oriented. Sensory function is intact to pinprick and light  touch.. Motor strength is felt to be 5 over 5 bilaterally with the exception of the left hip musculature which was difficult to assess secondary to pain and guarding.. No clonus or tremor. Good motor coordination. Deep tendon reflexes are trace and symmetric at the knees and at the ankles. Lymphatic: No axillary or cervical lymphadenopathy  MUSCULOSKELETAL: There is tenderness to palpation along the sacrum and SI joint region bilaterally. No gross rotational deformity or shortening of either lower extremity. Attempts at active and passive range of motion of the left hip elicit pain and guarding.  Assessment: Left superior and inferior pubic rami fractures Bilateral sacral fractures  Plan: The findings were discussed in detail with the patient and her husband. She just returned from a lumbar MRI, and those images are not available for review at this time. We discussed strategies for pain management as well as gradual progression of activities with physical therapy. She may need to consider short-term rehabilitation depending upon her physical therapy evaluation.  James P.  Holley Bouche M.D.

## 2016-08-20 NOTE — ED Triage Notes (Signed)
Pt arrived via EMS from home for reports of increasing left hip and leg pain that began Thursday. Pt reported to EMS had a fall 5 weeks ago with right hip pain that she was not treated for but resolved. Pt seen by orthopedics Wednesday last week and dx with sciatica and given prednisone. Pt seen by acute care Thursday last week because pain not better and xray was negative for fracture, hydrocodone and flexeril given. Pt on Amoxicillin for dental surgery. Today pt reports left hip pain increased and she has been unable to ambulate. VSS. Pt alert and oriented.

## 2016-08-20 NOTE — ED Provider Notes (Signed)
[facility] Emergency Department Provider Note  ____________________________________________  Time seen: Approximately 11:15 AM  I have reviewed the triage vital signs and the nursing notes.   HISTORY  Chief Complaint Hip Pain   HPI Katie Harding is a 76 y.o. female with h/o osteoporosis who presents for evaluation of left hip pain. Patient reports that 5 weeks ago she sustained a fall. Initially she had pain on her right hip but that resolved with NSAIDs. A week ago she started having pain in her left hip. She went to see her primary care doctor who thought she had sciatica and put her on prednisone. 2 days ago the pain got progressively worse and patient was having difficulty walking. She went to urgent care and her x-ray was done which was negative for fracture. She was put on Flexeril and hydrocodone. She was given a walker. Patient reports that the pain has been getting progressively worse and now she has been having difficulty walking even with a walker. She reports that the pain is mild at rest however becomes severe with minimal movement or sitting down. The pain is located in her left hip radiating down to her left leg, dull, and constant. No back pain now however she does endorse low back for one week, 5 weeks ago after the fall. She reports that the pain is so severe when sitting down that she is having trouble urinating, however has urinated twice today, no bowel incontinence or retention, no saddle anesthesia, no weakness or numbness of her extremities. No fever, no urinary symptoms, no nausea or vomiting, no unintentional weight loss.   Past Medical History:  Diagnosis Date  . #[id number] 2006  . Anxiety   . Breast cancer [facility]) 2006   right breast lumpectomy with radiation  . GERD (gastroesophageal reflux disease)   . MVP (mitral valve prolapse)   . Osteoporosis     Patient Active Problem List   Diagnosis Date Noted  . Closed fracture of multiple  pubic rami ([place]) 08/20/2016    Past Surgical History:  Procedure Laterality Date  . ABDOMINAL HYSTERECTOMY    . BREAST EXCISIONAL BIOPSY Right 2006   breast ca  . CHOLECYSTECTOMY    . COLONOSCOPY    . COLONOSCOPY WITH PROPOFOL N/A 05/05/2015   Procedure: COLONOSCOPY WITH PROPOFOL;  Surgeon: Manya Silvas, MD;  Location: [facility] ENDOSCOPY;  Service: Endoscopy;  Laterality: N/A;  . ESOPHAGOGASTRODUODENOSCOPY (EGD) WITH PROPOFOL N/A 05/05/2015   Procedure: ESOPHAGOGASTRODUODENOSCOPY (EGD) WITH PROPOFOL;  Surgeon: Manya Silvas, MD;  Location: [facility] ENDOSCOPY;  Service: Endoscopy;  Laterality: N/A;  . EYE SURGERY     cataracts removed  . NASAL SEPTUM SURGERY    . ovarian cyst removed    . ROTATOR CUFF REPAIR    . TONSILLECTOMY      Prior to Admission medications   Medication Sig Start Date End Date Taking? Authorizing Provider  ALPRAZolam (XANAX) 0.25 MG tablet Take 0.125 mg by mouth 4 (four) times daily.   Yes [provider]  amoxicillin (AMOXIL) 500 MG capsule Take 500 mg by mouth 4 (four) times daily. 08/17/16 08/25/16 Yes [provider]  calcium citrate-vitamin D (CITRACAL+D) 315-200 MG-UNIT tablet Take 2 tablets by mouth 2 (two) times daily.    Yes [provider]  Cholecalciferol (VITAMIN D3) 2000 units TABS Take 2,000 Units by mouth daily.    Yes [provider]  co-enzyme Q-10 30 MG capsule Take 30 mg by mouth daily.   Yes  [provider]  cyclobenzaprine (FLEXERIL) 10 MG tablet Take 10 mg by mouth 3 (three) times daily as needed for muscle spasms.   Yes [provider]  diflunisal (DOLOBID) 500 MG TABS tablet Take 500 mg by mouth 2 (two) times daily.   Yes [provider]  docusate sodium (COLACE) 100 MG capsule Take 100 mg by mouth 2 (two) times daily.   Yes [provider]  MAGNESIUM OXIDE, ANTACID, PO Take 300 mg by mouth 2 (two) times daily.   Yes [provider]  metoCLOPramide (REGLAN) 5 MG  tablet Take 5 mg by mouth at bedtime.    Yes [provider]  metoprolol succinate (TOPROL XL) 50 MG 24 hr tablet Take 25-50 mg by mouth See admin instructions. Take 91m qam, 50 mg at lunch, 255mwith dinner, and 25 mg qhs   Yes [provider]  Misc Natural Products ([place]SM FORMULA PO) Take 1 tablet by mouth daily.    Yes [provider]  Multiple Vitamin (MULTIVITAMIN) tablet Take 1 tablet by mouth daily.   Yes [provider]  omeprazole (PRILOSEC) 40 MG capsule Take 40 mg by mouth 2 (two) times daily before a meal.    Yes [provider]  predniSONE (DELTASONE) 5 MG tablet Take 5 mg by mouth daily with breakfast. 08/17/16 08/22/16 Yes [provider]    Allergies Patient has no known allergies.  Family History  Problem Relation Age of Onset  . Breast cancer Sister [id number]. Multiple myeloma Sister   . Breast cancer Maternal Aunt 60  . Breast cancer Maternal Aunt 60  . Colon cancer Brother   . Intracerebral hemorrhage Mother   . CVA Mother   . Hypertension Mother     Social History Social History  Substance Use Topics  . Smoking status: Never Smoker  . Smokeless tobacco: Never Used  . Alcohol use No    Review of Systems  Constitutional: Negative for fever. Eyes: Negative for visual changes. ENT: Negative for sore throat. Neck: No neck pain  Cardiovascular: Negative for chest pain. Respiratory: Negative for shortness of breath. Gastrointestinal: Negative for abdominal pain, vomiting or diarrhea. Genitourinary: Negative for dysuria. Musculoskeletal: Negative for back pain. + Left hip and leg pain Skin: Negative for rash. Neurological: Negative for headaches, weakness or numbness. Psych: No SI or HI  ____________________________________________   PHYSICAL EXAM:  VITAL SIGNS: ED Triage Vitals  Enc Vitals Group     BP 08/20/16 1100 129/69     Pulse Rate 08/20/16 1100 92     Resp 08/20/16 1100 16      Temp 08/20/16 1100 98.4 F (36.9 C)     Temp Source 08/20/16 1100 Oral     SpO2 08/20/16 1100 94 %     Weight 08/20/16 1056 105 lb (47.6 kg)     Height 08/20/16 1056 5' 7"  (1.702 m)     Head Circumference --      Peak Flow --      Pain Score 08/20/16 1055 10     Pain Loc --      Pain Edu? --      Excl. in [place]--     Constitutional: Alert and oriented. Well appearing and in no apparent distress. HEENT:      Head: Normocephalic and atraumatic.         Eyes: Conjunctivae are normal. Sclera is non-icteric.       Mouth/Throat: Mucous membranes are moist.  Neck: Supple with no signs of meningismus. Cardiovascular: Regular rate and rhythm. No murmurs, gallops, or rubs. 2+ symmetrical distal pulses are present in all extremities. No JVD. Respiratory: Normal respiratory effort. Lungs are clear to auscultation bilaterally. No wheezes, crackles, or rhonchi.  Gastrointestinal: Soft, non tender, and non distended with positive bowel sounds. No rebound or guarding. Musculoskeletal: ttp over the lateral aspect of the left proximal femur with reproducible pain on the lumbar region and left hip with internal rotation of the left hip, no obvious deformities. No midline c/t/l spine ttp. Neurologic: Normal speech and language. Face is symmetric. Moving all extremities. Strength is limited on the LLE due to pain, 2+ DTRs bilaterally on LE. No gross focal neurologic deficits are appreciated. Skin: Skin is warm, dry and intact. No rash noted. Psychiatric: Mood and affect are normal. Speech and behavior are normal.  ____________________________________________   LABS (all labs ordered are listed, but only abnormal results are displayed)  Labs Reviewed  CBC  CREATININE, SERUM   ____________________________________________  EKG  ED ECG REPORT I, Rudene Re, the attending physician, personally viewed and interpreted this ECG.  Normal sinus rhythm, rate of 90, normal intervals, right axis  deviation, T-wave inversions in 1, aVL, V2, no ST elevations or depressions. Unchanged from prior from 2015 ____________________________________________  RADIOLOGY  CT pelvis: Fracture of the left pubic body junction with the inferior and superior left pubic ramus. Some surrounding callus with incomplete healing.  Broad fracture of the left and right sacrum with surrounding sclerosis. Mild angulation the anterior margin of the fractured S2 vertebra.  No hip or acetabular fracture detected.  Chronic L5 pedicle pars defect with minimal anterior slip L5.  Chronic Schmorl's node deformity superior endplate L4.  Third spacing of fluid.  Fullness of the urinary bladder may contribute to the minimal fullness of the renal collecting system bilaterally.  ____________________________________________   PROCEDURES  Procedure(s) performed: None Procedures Critical Care performed:  None ____________________________________________   INITIAL IMPRESSION / ASSESSMENT AND PLAN / ED COURSE  77 y.o. female with h/o osteoporosis who presents for evaluation of left hip pain s/p fall 5 weeks ago. Patient with negative XR done 2 days ago, will perform CT scan to rule out occult fracture.   Clinical Course as of Aug 21 1410  Mon Aug 20, 2016  1240 Patient with pubic ramus fracture seen on CT scan. She has received 2 rounds of narcotic pain medication and continues to be unable to sit up or bear weight. We'll admit for analgesia and physical therapy.  [CV]    Clinical Course User Index [CV] Rudene Re, MD    Pertinent labs & imaging results that were available during my care of the patient were reviewed by me and considered in my medical decision making (see chart for details).    ____________________________________________   FINAL CLINICAL IMPRESSION(S) / ED DIAGNOSES  Final diagnoses:  Closed fracture of pubic ramus, unspecified laterality, initial encounter ([place])       NEW MEDICATIONS STARTED DURING THIS VISIT:  New Prescriptions   No medications on file     Note:  This document was prepared using Dragon voice recognition software and may include unintentional dictation errors.    Alfred Levins, [place], MD 08/20/16 [phone]

## 2016-08-20 NOTE — ED Notes (Signed)
The EKG was completed and signed by Dr. Alfred Levins. The EKG was also exported into the system.

## 2016-08-20 NOTE — ED Notes (Signed)
Informed RN bed ready  1500

## 2016-08-20 NOTE — ED Notes (Signed)
Patient transported to CT 

## 2016-08-20 NOTE — Progress Notes (Signed)
Patient ID: Katie Harding, female   DOB: July 16, 1940, 76 y.o.   MRN: [id number] ACP Note  Patient, husband and other family at the bedside  CODE STATUS discussed.  Patient took a long time thinking about her CODE STATUS. She and husband decided to be a DO NOT RESUSCITATE. I explained what happens with the CPR process. Patient did not want to be hooked up to any machines to survive.  Other diagnosis. Sacral insufficiency fracture, pubic rami fracture, arrhythmia, GERD, history of breast cancer  Time spent on ACP discussion 17 minutes Dr. Loletha Grayer

## 2016-08-20 NOTE — H&P (Signed)
[place] [facility] [place] NAME: Katie Harding    MR#:  [id number]  DATE OF BIRTH:  August 19, 1940  DATE OF ADMISSION:  08/20/2016  PRIMARY CARE PHYSICIAN: Rusty Aus, MD   REQUESTING/REFERRING PHYSICIAN: Gonzella Lex  CHIEF COMPLAINT:   Chief Complaint  Patient presents with  . Hip Pain    HISTORY OF PRESENT ILLNESS:  Katie Harding  is a 76 y.o. female. She presents 5 weeks after a fall off a step off ladder where she fell on her buttock and right side and right shoulder. She's been very sore. It's worse when she sits. She seemed to get a little bit better but on Thursday then had pain in the left hip groin area. She saw 24 assistant and was given prednisone. On Saturday she went to the acute care center was given Flexeril and hydrocodone. She is taking the hydrocodone daily at bedtime. The pain is been worse yesterday and today. Severe in intensity 10 out of 10 at times. She's also had constipation for 3 days. She's had some trouble urinating for the past 2 days but was able to urinate. She had a Foley catheter placed in the ER.  PAST MEDICAL HISTORY:   Past Medical History:  Diagnosis Date  . #[id number] 2006  . Anxiety   . Breast cancer [facility]) 2006   right breast lumpectomy with radiation  . GERD (gastroesophageal reflux disease)   . MVP (mitral valve prolapse)   . Osteoporosis     PAST SURGICAL HISTORY:   Past Surgical History:  Procedure Laterality Date  . ABDOMINAL HYSTERECTOMY    . BREAST EXCISIONAL BIOPSY Right 2006   breast ca  . CHOLECYSTECTOMY    . COLONOSCOPY    . COLONOSCOPY WITH PROPOFOL N/A 05/05/2015   Procedure: COLONOSCOPY WITH PROPOFOL;  Surgeon: Manya Silvas, MD;  Location: [facility] ENDOSCOPY;  Service: Endoscopy;  Laterality: N/A;  . ESOPHAGOGASTRODUODENOSCOPY (EGD) WITH PROPOFOL N/A 05/05/2015   Procedure: ESOPHAGOGASTRODUODENOSCOPY (EGD) WITH PROPOFOL;  Surgeon: Manya Silvas, MD;  Location:  [facility] ENDOSCOPY;  Service: Endoscopy;  Laterality: N/A;  . EYE SURGERY     cataracts removed  . NASAL SEPTUM SURGERY    . ovarian cyst removed    . ROTATOR CUFF REPAIR    . TONSILLECTOMY      SOCIAL HISTORY:   Social History  Substance Use Topics  . Smoking status: Never Smoker  . Smokeless tobacco: Never Used  . Alcohol use No    FAMILY HISTORY:   Family History  Problem Relation Age of Onset  . Breast cancer Sister 32  . Multiple myeloma Sister   . Breast cancer Maternal Aunt 60  . Breast cancer Maternal Aunt 60  . Colon cancer Brother   . Intracerebral hemorrhage Mother   . CVA Mother   . Hypertension Mother     DRUG ALLERGIES:  No Known Allergies  REVIEW OF SYSTEMS:  CONSTITUTIONAL: No fever, fatigue or weakness. Positive for night sweats. EYES: No blurred or double vision. Wears glasses. EARS, NOSE, AND THROAT: No tinnitus or ear pain. No sore throat. Positive for runny nose RESPIRATORY: No cough, shortness of breath, wheezing or hemoptysis.  CARDIOVASCULAR: No chest pain, orthopnea, edema.  GASTROINTESTINAL: No nausea, vomiting, diarrhea or abdominal pain. No blood in bowel movements. Constipation for 3 days GENITOURINARY: No dysuria, hematuria. Positive urinary retention and trouble urinating ENDOCRINE: No polyuria, nocturia,  HEMATOLOGY: No anemia, easy bruising or bleeding SKIN: No rash or lesion.  MUSCULOSKELETAL: Positive for pain lower back. Positive for pain left hip and groin. Positive for arthritis in the hands NEUROLOGIC: No tingling, numbness, weakness.  PSYCHIATRY: History of anxiety.   MEDICATIONS AT HOME:   Prior to Admission medications   Medication Sig Start Date End Date Taking? Authorizing Provider  ALPRAZolam (XANAX) 0.25 MG tablet Take 0.125 mg by mouth 4 (four) times daily.   Yes [provider]  amoxicillin (AMOXIL) 500 MG capsule Take 500 mg by mouth 4 (four) times daily. 08/17/16 08/25/16 Yes [provider]  calcium  citrate-vitamin D (CITRACAL+D) 315-200 MG-UNIT tablet Take 2 tablets by mouth 2 (two) times daily.    Yes [provider]  Cholecalciferol (VITAMIN D3) 2000 units TABS Take 2,000 Units by mouth daily.    Yes [provider]  co-enzyme Q-10 30 MG capsule Take 30 mg by mouth daily.   Yes [provider]  cyclobenzaprine (FLEXERIL) 10 MG tablet Take 10 mg by mouth 3 (three) times daily as needed for muscle spasms.   Yes [provider]  diflunisal (DOLOBID) 500 MG TABS tablet Take 500 mg by mouth 2 (two) times daily.   Yes [provider]  docusate sodium (COLACE) 100 MG capsule Take 100 mg by mouth 2 (two) times daily.   Yes [provider]  MAGNESIUM OXIDE, ANTACID, PO Take 300 mg by mouth 2 (two) times daily.   Yes [provider]  metoCLOPramide (REGLAN) 5 MG tablet Take 5 mg by mouth at bedtime.    Yes [provider]  metoprolol succinate (TOPROL XL) 50 MG 24 hr tablet Take 25-50 mg by mouth See admin instructions. Take 24m qam, 50 mg at lunch, 227mwith dinner, and 25 mg qhs   Yes [provider]  Misc Natural Products (GLWaterproofSM FORMULA PO) Take 1 tablet by mouth daily.    Yes [provider]  Multiple Vitamin (MULTIVITAMIN) tablet Take 1 tablet by mouth daily.   Yes [provider]  omeprazole (PRILOSEC) 40 MG capsule Take 40 mg by mouth 2 (two) times daily before a meal.    Yes [provider]  predniSONE (DELTASONE) 5 MG tablet Take 5 mg by mouth daily with breakfast. 08/17/16 08/22/16 Yes [provider]      VITAL SIGNS:  Blood pressure 135/71, pulse 79, temperature 98.4 F (36.9 C), temperature source Oral, resp. rate 18, height 5' 7"  (1.702 m), weight 47.6 kg (105 lb), SpO2 100 %.  PHYSICAL EXAMINATION:  GENERAL:  [age].o.-year-old patient lying in the bed with no acute distress.  EYES: Pupils equal, round, reactive to light and accommodation. No scleral  icterus. Extraocular muscles intact.  HEENT: Head atraumatic, normocephalic. Oropharynx and nasopharynx clear.  NECK:  Supple, no jugular venous distention. No thyroid enlargement, no tenderness.  LUNGS: Normal breath sounds bilaterally, no wheezing, rales,rhonchi or crepitation. No use of accessory muscles of respiration.  CARDIOVASCULAR: S1, S2 normal. No murmurs, rubs, or gallops.  ABDOMEN: Soft, nontender, nondistended. Bowel sounds present. No organomegaly or mass.  EXTREMITIES: No pedal edema, cyanosis, or clubbing.  NEUROLOGIC: Cranial nerves II through XII are intact. Muscle strength 5/5 In ankles bilaterally. Patient difficulty with straight leg raise on the left side and barely able to get the heel off the bed. Pain limits straight leg raise with the right side but she is able to do it. Sensation intact to light touch. Gait not checked.  PSYCHIATRIC: The patient is alert and oriented x 3.  SKIN:  No rash, lesion, or ulcer.   LABORATORY PANEL:  Ordered.  RADIOLOGY:  Ct Lumbar Spine Wo Contrast  Result Date: 08/20/2016 CLINICAL DATA:  Progressive left hip and leg pain beginning 4 days ago. Fall 5 weeks ago with right hip pain which has subsequently resolved. Unable ambulate. EXAM: CT LUMBAR SPINE WITHOUT CONTRAST TECHNIQUE: Multidetector CT imaging of the lumbar spine was performed without intravenous contrast administration. Multiplanar CT image reconstructions were also generated. COMPARISON:  CT of the abdomen pelvis 11/26/2006. FINDINGS: Segmentation: 5 non rib-bearing lumbar type vertebral bodies are present. Alignment: Grade 1 anterolisthesis is present at L4-5 and at L5-S1. Anterolisthesis at L5-S1 measures 7 mm in the midline. Mild rightward curvature is present in the lower lumbar spine. Vertebrae: Schmorl's nodes are present at L3-4. A chronic left-sided pars defect is noted at L5. Bilateral sacral ala fractures are present with minimal displacement. Paraspinal and other soft  tissues: Atherosclerotic calcifications are present within the aorta without aneurysm. Cholecystectomy is noted. The urinary bladder is moderately distended. Diverticular changes are present in the sigmoid colon without focal inflammation to suggest diverticulitis. Disc levels: L1-2: Negative. L2-3:  Negative. L3-4:  Negative. L4-5: Mild disc bulging is present. Moderate facet hypertrophy bilaterally contributes to the slight anterolisthesis. L5-S1: Left-sided pars defect is noted. Mild facet hypertrophy is worse on the right. No focal lytic or blastic lesions are present. IMPRESSION: 1. Bilateral acute/subacute sacral insufficiency fractures. 2. 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 3. Slight anterolisthesis at L4-5 secondary to facet degenerative change. 4. Endplate changes at O1-6. 5. Atherosclerosis. 6. Sigmoid diverticulosis without diverticulitis. Electronically Signed   By: San Morelle M.D.   On: 08/20/2016 11:51   Ct Pelvis Wo Contrast  Result Date: 08/20/2016 CLINICAL DATA:  76 year old female with increasing left hip and leg pain starting 4 days ago. Fell 5 weeks ago with right hip pain which resolved. Initial encounter. EXAM: CT PELVIS WITHOUT CONTRAST TECHNIQUE: Multidetector CT imaging of the pelvis was performed following the standard protocol without intravenous contrast. COMPARISON:  11/26/2006 CT. FINDINGS: Urinary Tract: Noncontrast filled prominent size urinary bladder without obvious abnormality. Inferior aspect of the kidneys with the slight prominence renal pelvis. This may be related to the incomplete emptying of the urinary bladder. Bowel:  Sigmoid diverticulosis. Vascular/Lymphatic: Calcified slightly ectatic iliac arteries. No adenopathy. Reproductive:  Post hysterectomy. Other:  Third spacing of fluid. Musculoskeletal: Fracture of the left pubic body junction with the inferior and superior left pubic ramus. Some surrounding callus with incomplete  healing. Broad fracture of the left and right sacrum with surrounding sclerosis. Mild angulation the anterior margin of the fractured S2 vertebra. Chronic L5 pedicle pars defect with minimal anterior slip L5. Schmorl's node deformity superior endplate L4. IMPRESSION: Fracture of the left pubic body junction with the inferior and superior left pubic ramus. Some surrounding callus with incomplete healing. Broad fracture of the left and right sacrum with surrounding sclerosis. Mild angulation the anterior margin of the fractured S2 vertebra. No hip or acetabular fracture detected. Chronic L5 pedicle pars defect with minimal anterior slip L5. Chronic Schmorl's node deformity superior endplate L4. Third spacing of fluid. Fullness of the urinary bladder may contribute to the minimal fullness of the renal collecting system bilaterally. Electronically Signed   By: Genia Del M.D.   On: 08/20/2016 11:52    EKG:   Normal sinus rhythm 90 bpm no acute ST-T wave changes  IMPRESSION AND PLAN:   1. Left-sided pubic rami fracture and  sacral insufficiency fracture. Pain control with oral oxycodone. We'll get orthopedic surgery consultation. We'll get physical therapy consultation. May end up needing rehabilitation we'll get social worker consultation. We'll get an MRI of the lumbar sacral spine and sacrum to rule out any nerve-type issues since the patient having constipation and urinary retention. 2. Urinary retention. MRI for further evaluation. Hopefully can take out the Foley catheter tomorrow. Treat constipation. 3. History of arrhythmia on Toprol 4. GERD on PPI and Reglan 5. History of breast cancer  All the records are reviewed and case discussed with ED provider. Management plans discussed with the patient, family and they are in agreement.  CODE STATUS: DO NOT RESUSCITATE  TOTAL TIME TAKING CARE OF THIS PATIENT: 55 minutes. Including ACP time.   Loletha Grayer M.D on 08/20/2016 at 1:31  PM  Between 7am to 6pm - Pager - [phone]  After 6pm call admission pager [phone]  [facility] Office  [phone]  CC: Primary care physician; Rusty Aus, MD

## 2016-08-21 ENCOUNTER — Encounter
Admission: RE | Admit: 2016-08-21 | Discharge: 2016-08-21 | Disposition: A | Discharge status: Home / Self Care | Payer: Medicare Other | Source: Ambulatory Visit | Attending: Internal Medicine | Admitting: Internal Medicine

## 2016-08-21 LAB — BASIC METABOLIC PANEL
Anion gap: 8 (ref 5–15)
BUN: 15 mg/dL (ref 6–20)
CHLORIDE: 90 mmol/L — AB (ref 101–111)
CO2: 29 mmol/L (ref 22–32)
CREATININE: 0.44 mg/dL (ref 0.44–1.00)
Calcium: 8.5 mg/dL — ABNORMAL LOW (ref 8.9–10.3)
GFR calc Af Amer: >60 mL/min (ref >60)
GFR calc non Af Amer: >60 mL/min (ref >60)
GLUCOSE: 91 mg/dL (ref 65–99)
POTASSIUM: 3.1 mmol/L — AB (ref 3.5–5.1)
Sodium: 127 mmol/L — ABNORMAL LOW (ref 135–145)

## 2016-08-21 LAB — CBC
HEMATOCRIT: 37 % (ref 35.0–47.0)
Hemoglobin: 12.8 g/dL (ref 12.0–16.0)
MCH: 30.9 pg (ref 26.0–34.0)
MCHC: 34.5 g/dL (ref 32.0–36.0)
MCV: 89.6 fL (ref 80.0–100.0)
Platelets: 277 10*3/uL (ref 150–440)
RBC: 4.13 MIL/uL (ref 3.80–5.20)
RDW: 13.3 % (ref 11.5–14.5)
WBC: 7.4 10*3/uL (ref 3.6–11.0)

## 2016-08-21 MED ORDER — AMOXICILLIN 500 MG PO CAPS
500.0000 mg | ORAL_CAPSULE | Freq: Three times a day (TID) | ORAL | Status: DC
  Administered 2016-08-21 – 2016-08-22 (×3): 500 mg via ORAL
  Filled 2016-08-21 (×6): qty 1

## 2016-08-21 MED ORDER — POTASSIUM CHLORIDE IN NACL 20-0.9 MEQ/L-% IV SOLN
INTRAVENOUS | Status: DC
  Administered 2016-08-21 – 2016-08-22 (×2): via INTRAVENOUS
  Filled 2016-08-21 (×3): qty 1000

## 2016-08-21 MED ORDER — ENSURE ENLIVE PO LIQD
237.0000 mL | Freq: Two times a day (BID) | ORAL | Status: DC
  Administered 2016-08-21: 237 mL via ORAL

## 2016-08-21 NOTE — Clinical Social Work Note (Signed)
Clinical Social Work Assessment  Patient Details  Name: LASHAN MACIAS MRN: [id number] Date of Birth: 1940/07/08  Date of referral:  08/21/16               Reason for consult:  Facility Placement                Permission sought to share information with:  Chartered certified accountant granted to share information::  Yes, Verbal Permission Granted  Name::      [place]::   [place]   Relationship::     Contact Information:     Housing/Transportation Living arrangements for the past 2 months:  [place] of Information:  Patient, Spouse Patient Interpreter Needed:  None Criminal Activity/Legal Involvement Pertinent to Current Situation/Hospitalization:  No - Comment as needed Significant Relationships:  Other Family Members, Spouse Lives with:  Spouse Do you feel safe going back to the place where you live?  Yes Need for family participation in patient care:  Yes (Comment)  Care giving concerns:  Patient lives in [place] with her husband Mahli Glahn.    Social Worker assessment / plan:  Holiday representative (CSW) received SNF consult. PT is recommending SNF. Per Ortho MD patient has pubic rami fractures. CSW met with patient and her husband and 2 nieces were at bedside. Patient was alert and oriented X4 and was laying in the bed. CSW introduced self and explained role of CSW department. Patient reported that she lives in [place] with her husband. Per patient her husband is her HPOA and her niece Suanne Marker is the second person listed on her HPOA paper work. CSW explained SNF process. Patient reported that she has never been to SNF herself but has had several family members in them. Patient is agreeable to SNF search in [facility] and prefers [facility] or [facility]. FL2 complete and faxed out. CSW will continue to follow and assist as needed.   Employment status:  Retired, Disabled (Comment on whether or  not currently receiving Disability) Insurance information:  Managed Medicare PT Recommendations:  [place] / Referral to community resources:  [place]  Patient/Family's Response to care:  Patient and her husband are agreeable to [facility] in [place].   Patient/Family's Understanding of and Emotional Response to Diagnosis, Current Treatment, and Prognosis:  Patient and her family were very pleasant and thanked CSW for assistance.   Emotional Assessment Appearance:  Appears stated age Attitude/Demeanor/Rapport:    Affect (typically observed):  Accepting, Adaptable, Pleasant Orientation:  Oriented to Self, Oriented to Place, Oriented to  Time, Oriented to Situation Alcohol / Substance use:  Not Applicable Psych involvement (Current and /or in the community):  No (Comment)  Discharge Needs  Concerns to be addressed:  Discharge Planning Concerns Readmission within the last 30 days:  No Current discharge risk:  Dependent with Mobility Barriers to Discharge:  Continued Medical Work up   [employer], Veronia Beets, LCSW 08/21/2016, 2:07 PM

## 2016-08-21 NOTE — Evaluation (Signed)
Physical Therapy Evaluation Patient Details Name: Katie Harding MRN: [id number] DOB: 10/30/1940 Today's Date: 08/21/2016   History of Present Illness  Pt is a 76 y.o. F who presented to ED with L hip and LE pain. Imaging shows L inferior and superior ramus fracture, acute B sacral insufficiency fracture (R>L), and associated acute non-displaced fracture of the L transverse process of L5. Pt admitted 08/20/2016 with prior mentioned fractures. PMH: anxiety, breast cancer, GERD, arrhythmia, MVP, osteoporosis, rotator cuff repair.    Clinical Impression  Prior to admission pt reports independent with ADL's and functional mobility. Pt reports family is available at discharge but states "if I go down, it all falls apart" (in terms of caring for her husband and husband's sister). Currently pt requires +1 mod assist for bed mobility and +2 min assist for sit to stand with RW (unable to fully achieve standing due to significant increase in B hip pain [mostly L] 9/10). Pt's cognition WFL for tasks assessed during today's session. Pt reports 4/10 B hip pain beginning of session (premedicated); 6/10 B hip pain sitting on EOB; 9/10 B hip pain during sit to stand attempt; and 5/10 B hip pain end of session. Pt will continue to benefit from skilled PT to address strength, pain, balance, and functional mobility deficits (see below). Recommended discharge to SNF.    Follow Up Recommendations SNF    Equipment Recommendations  Rolling walker with 5" wheels    Recommendations for Other Services       Precautions / Restrictions Precautions Precautions: Fall Restrictions Weight Bearing Restrictions: Yes RLE Weight Bearing: Weight bearing as tolerated LLE Weight Bearing: Weight bearing as tolerated      Mobility  Bed Mobility Overal bed mobility: Needs Assistance Bed Mobility: Supine to Sit;Sit to Supine     Supine to sit: Mod assist;HOB elevated Sit to supine: Mod assist;HOB elevated   General bed  mobility comments: pt able to perform supine to/from sit with mod assist for L LE (likely due to pain) and HOB elevated with increased time/effort noted  Transfers Overall transfer level: Needs assistance Equipment used: Rolling walker (2 wheeled) Transfers: Sit to/from Stand Sit to Stand: Min assist;+2 physical assistance         General transfer comment: pt attempts sit to stand with +2 min assist and RW; pt unable to fully achieve standing due to significant increase in pain (9/10); vc's for proper B LE placement given initially   Ambulation/Gait             General Gait Details: deferred due to pt unable to achieve full upright due to pain  Stairs            Wheelchair Mobility    Modified Rankin (Stroke Patients Only)       Balance Overall balance assessment: Needs assistance Sitting-balance support: Bilateral upper extremity supported;Feet supported Sitting balance-Leahy Scale: Fair Sitting balance - Comments: pt able to maintain static sitting balance with no overt loss of balance noted; pt also able to perform B knee flexion/extension and B hip flexion in sitting with B UE support with no overt loss of balance noted       Standing balance comment: unable to assess due to pt not able to achieve full standing due to significant increase in pain                             Pertinent Vitals/Pain Pain Assessment: 0-10 Pain Score:  5  (increases to 9/10 with sit to stand attempt) Pain Location: B hip (mostly L) Pain Descriptors / Indicators: Aching;Grimacing;Guarding;Spasm Pain Intervention(s): Limited activity within patient's tolerance;Monitored during session;Premedicated before session;Repositioned;Patient requesting pain meds-RN notified    Home Living Family/patient expects to be discharged to:: Private residence Living Arrangements: Spouse/significant other Available Help at Discharge: Family Type of Home: House Home Access: Stairs to  enter   Technical brewer of Steps: 1 Home Layout: One level Home Equipment: Environmental consultant - 2 wheels;Grab bars - tub/shower      Prior Function Level of Independence: Independent         Comments: pt reports independence with ADL's     Hand Dominance        Extremity/Trunk Assessment   Upper Extremity Assessment Upper Extremity Assessment: Generalized weakness (B UE grossly 4/5 for shoulder flexion, elbow flexion/extension and shoulder abduction)    Lower Extremity Assessment Lower Extremity Assessment: Generalized weakness (B LE at least 3/5 grossly for ankle dorsiflexion, knee flexion/extension, and hip flexion; resistance not applied due to fracture and pain)       Communication   Communication: No difficulties  Cognition Arousal/Alertness: Awake/alert Behavior During Therapy: WFL for tasks assessed/performed Overall Cognitive Status: Within Functional Limits for tasks assessed                                        General Comments      Exercises     Assessment/Plan    PT Assessment Patient needs continued PT services  PT Problem List Decreased strength;Decreased activity tolerance;Decreased balance;Decreased mobility;Decreased knowledge of use of DME;Decreased knowledge of precautions;Pain       PT Treatment Interventions DME instruction;Gait training;Stair training;Functional mobility training;Therapeutic activities;Therapeutic exercise;Balance training;Patient/family education    PT Goals (Current goals can be found in the Care Plan section)  Acute Rehab PT Goals Patient Stated Goal: to reduce pain and return home PT Goal Formulation: With patient Time For Goal Achievement: 09/04/16 Potential to Achieve Goals: Good    Frequency 7X/week   Barriers to discharge        Co-evaluation               AM-PAC PT "6 Clicks" Daily Activity  Outcome Measure Difficulty turning over in bed (including adjusting bedclothes, sheets and  blankets)?: A Lot Difficulty moving from lying on back to sitting on the side of the bed? : Total Difficulty sitting down on and standing up from a chair with arms (e.g., wheelchair, bedside commode, etc,.)?: Total Help needed moving to and from a bed to chair (including a wheelchair)?: Total Help needed walking in hospital room?: Total Help needed climbing 3-5 steps with a railing? : Total 6 Click Score: 7    End of Session Equipment Utilized During Treatment: Gait belt Activity Tolerance: Patient limited by pain Patient left: in bed;with call bell/phone within reach;with bed alarm set;with family/visitor present (per pt request B LE elevated via pillow for comfort) Nurse Communication: Mobility status (via whiteboard) PT Visit Diagnosis: Other abnormalities of gait and mobility (R26.89);Muscle weakness (generalized) (M62.81);History of falling (Z91.81);Pain Pain - Right/Left: Left Pain - part of body: Hip    Time: 1443-1540 PT Time Calculation (min) (ACUTE ONLY): 29 min   Charges:         PT G CodesLaqueta Carina, SPT 14-Sep-2016,11:29 AM [phone]

## 2016-08-21 NOTE — Progress Notes (Signed)
Pt requesting amoxicillin 500 mg po she was taking at home for oral surgery. Order received from Dr Vianne Bulls

## 2016-08-21 NOTE — NC FL2 (Signed)
[place] LEVEL OF CARE SCREENING TOOL     IDENTIFICATION  Patient Name: Katie Harding Birthdate: 1940-06-06 Sex: female Admission Date (Current Location): 08/20/2016  [place] and [place] Number:  Engineering geologist and Address:  [facility], [address] [place], [place] [address]      Provider Number: [id number]  Attending Physician Name and Address:  Epifanio Lesches, MD  Relative Name and Phone Number:       Current Level of Care: Hospital Recommended Level of Care: [place] Prior Approval Number:    Date Approved/Denied:   PASRR Number:  [id number])  Discharge Plan: SNF    Current Diagnoses: Patient Active Problem List   Diagnosis Date Noted  . Closed fracture of multiple pubic rami ([place]) 08/20/2016    Orientation RESPIRATION BLADDER Height & Weight     Self, Time, Situation, Place  Normal Continent Weight: 108 lb 14.4 oz (49.4 kg) Height:  5\' 7"  (170.2 cm)  BEHAVIORAL SYMPTOMS/MOOD NEUROLOGICAL BOWEL NUTRITION STATUS   (none)  (none) Continent Diet (Diet: Soft )  AMBULATORY STATUS COMMUNICATION OF NEEDS Skin   Extensive Assist Verbally Normal                       Personal Care Assistance Level of Assistance  Bathing, Feeding, Dressing Bathing Assistance: Limited assistance Feeding assistance: Independent Dressing Assistance: Limited assistance     Functional Limitations Info  Sight, Hearing, Speech Sight Info: Adequate Hearing Info: Adequate Speech Info: Adequate    SPECIAL CARE FACTORS FREQUENCY  PT (By licensed PT), OT (By licensed OT)     PT Frequency:  (5) OT Frequency:  (5)            Contractures      Additional Factors Info  Code Status, Allergies Code Status Info:  (DNR ) Allergies Info:  (No Known Allergies. )           Current Medications (08/21/2016):  This is the current hospital active medication list Current Facility-Administered Medications   Medication Dose Route Frequency Provider Last Rate Last Dose  . 0.9 % NaCl with KCl 20 mEq/ L  infusion   Intravenous Continuous Epifanio Lesches, MD      . acetaminophen (TYLENOL) tablet 650 mg  650 mg Oral Q6H PRN Wieting, Richard, MD       Or  . acetaminophen (TYLENOL) suppository 650 mg  650 mg Rectal Q6H PRN Wieting, Richard, MD      . ALPRAZolam Duanne Moron) tablet 0.125 mg  0.125 mg Oral QID Loletha Grayer, MD   0.125 mg at 08/21/16 0856  . bisacodyl (DULCOLAX) EC tablet 5 mg  5 mg Oral Daily PRN Loletha Grayer, MD      . calcium-vitamin D (OSCAL WITH D) 500-200 MG-UNIT per tablet 1 tablet  1 tablet Oral BID Lenis Noon, [facility]   1 tablet at 08/21/16 0109  . cholecalciferol (VITAMIN D) tablet 2,000 Units  2,000 Units Oral Daily Loletha Grayer, MD   2,000 Units at 08/21/16 [phone]  . docusate sodium (COLACE) capsule 100 mg  100 mg Oral BID Loletha Grayer, MD   100 mg at 08/21/16 0856  . enoxaparin (LOVENOX) injection 40 mg  40 mg Subcutaneous Q24H Loletha Grayer, MD   40 mg at 08/20/16 2104  . ibuprofen (ADVIL,MOTRIN) tablet 400 mg  400 mg Oral BID Loletha Grayer, MD   400 mg at 08/21/16 0857  . magnesium oxide (MAG-OX) tablet 400 mg  400 mg Oral BID Lenis Noon, RPH   400 mg at 08/21/16 3435  . metaxalone (SKELAXIN) tablet 800 mg  800 mg Oral Q8H PRN Dereck Leep, MD   800 mg at 08/21/16 0858  . metoCLOPramide (REGLAN) tablet 5 mg  5 mg Oral QHS Loletha Grayer, MD   5 mg at 08/20/16 2104  . metoprolol succinate (TOPROL-XL) 24 hr tablet 50 mg  50 mg Oral 3 times per day Lenis Noon, RPH   50 mg at 08/21/16 1244  . multivitamin with minerals tablet 1 tablet  1 tablet Oral Daily Loletha Grayer, MD   1 tablet at 08/21/16 0857  . ondansetron (ZOFRAN) tablet 4 mg  4 mg Oral Q6H PRN Loletha Grayer, MD       Or  . ondansetron [facility]) injection 4 mg  4 mg Intravenous Q6H PRN Wieting, Richard, MD      . oxyCODONE (Oxy IR/ROXICODONE) immediate release tablet 5 mg  5 mg Oral  Q4H PRN Loletha Grayer, MD   5 mg at 08/21/16 1047  . pantoprazole (PROTONIX) EC tablet 40 mg  40 mg Oral Daily Loletha Grayer, MD   40 mg at 08/21/16 0857  . polyethylene glycol (MIRALAX / GLYCOLAX) packet 17 g  17 g Oral Daily Loletha Grayer, MD   17 g at 08/21/16 6861  . sodium phosphate (FLEET) 7-19 GM/118ML enema 1 enema  1 enema Rectal Daily PRN Loletha Grayer, MD         Discharge Medications: Please see discharge summary for a list of discharge medications.  Relevant Imaging Results:  Relevant Lab Results:   Additional Information  (SSN: [id number])  Revanth Neidig, Veronia Beets, LCSW

## 2016-08-21 NOTE — Care Management Obs Status (Signed)
[place] NOTIFICATION   Patient Details  Name: Katie Harding MRN: [id number] Date of Birth: 11/11/1940   Medicare Observation Status Notification Given:  Yes    Jolly Mango, RN 08/21/2016, 8:26 AM

## 2016-08-21 NOTE — Progress Notes (Signed)
Clinical Social Worker (CSW) received SNF consult. PT is pending. CSW will continue to follow and assist as needed.   Makailah Slavick, LCSW [phone]  

## 2016-08-21 NOTE — Progress Notes (Signed)
Clinical Education officer, museum (CSW) presented bed offers to patient and her husband. They chose [facility]. Plan is for patient to D/C to [facility] tomorrow pending medical clearance. Kim admissions coordinator at [facility] is aware of above. CSW will continue to follow and assist as needed.  [employer], LCSW [phone]

## 2016-08-21 NOTE — Clinical Social Work Placement (Signed)
   CLINICAL SOCIAL WORK PLACEMENT  NOTE  Date:  08/21/2016  Patient Details  Name: Katie Harding MRN: [id number] Date of Birth: 1940/05/14  Clinical Social Work is seeking post-discharge placement for this patient at the [place] level of care (*CSW will initial, date and re-position this form in  chart as items are completed):  Yes   Patient/family provided with [place] Work Department's list of facilities offering this level of care within the geographic area requested by the patient (or if unable, by the patient's family).  Yes   Patient/family informed of their freedom to choose among providers that offer the needed level of care, that participate in Medicare, Medicaid or managed care program needed by the patient, have an available bed and are willing to accept the patient.  Yes   Patient/family informed of [place]'s ownership interest in [facility] and [facility], as well as of the fact that they are under no obligation to receive care at these facilities.  PASRR submitted to [facility] on 08/21/16     PASRR number received on 08/21/16     Existing PASRR number confirmed on       FL2 transmitted to all facilities in geographic area requested by pt/family on 08/21/16     FL2 transmitted to all facilities within larger geographic area on       Patient informed that his/her managed care company has contracts with or will negotiate with certain facilities, including the following:            Patient/family informed of bed offers received.  Patient chooses bed at       Physician recommends and patient chooses bed at      Patient to be transferred to   on  .  Patient to be transferred to facility by       Patient family notified on   of transfer.  Name of family member notified:        PHYSICIAN       Additional Comment:    _______________________________________________ Lashawna Poche, Veronia Beets, LCSW 08/21/2016, 2:07 PM

## 2016-08-21 NOTE — Progress Notes (Signed)
Initial Nutrition Assessment  DOCUMENTATION CODES:   Severe malnutrition in context of chronic illness, Underweight  INTERVENTION:  1. Continue MVI w/ Minerals 2. Ensure Enlive po BID, each supplement provides 350 kcal and 20 grams of protein 3. Magic cup TID with meals, each supplement provides 290 kcal and 9 grams of protein  NUTRITION DIAGNOSIS:   Malnutrition related to chronic illness ([place]) as evidenced by severe depletion of body fat, severe depletion of muscle mass.  GOAL:   Patient will meet greater than or equal to 90% of their needs  MONITOR:   PO intake, I & O's, Labs, Supplement acceptance, Weight trends  REASON FOR ASSESSMENT:   Low [place]    ASSESSMENT:   76 yo female with h/o anxiety, breast cancer, GERD, osteoporosis presents 5 weeks after fall off step ladder with soreness around buttocks now has left sided pubic rami fracture and bilateral sacral fractures.   Spoke with patient at bedside. PO intake has been limited recently, patient had dental surgery on Thursday. Has been eating soft foods since then, mashed potatoes with vegetables for lunch. Normally eats eggs and mashed potatoes from [employer] from dinner. Doesn't eat breakfast. Has not eaten much during her stay so far. Patient complains of radiating pain from Left side, which was exacerbated by PT. Denies weight loss, reports UBW of 105# Discussed with patient the importance of protein and calorie intake s/p surgery, encouraged her to consume ensure during this time especially s/p dental surgery as she is not able to consume much meat. She was amenable to consuming ensure during hospital stay. UOP 1336mL last 24 hours Nutrition-Focused physical exam completed. Findings are severe fat depletion, severe muscle depletion, and no edema.  Medications reviewed and include:  Ca-Vitamin D, Vitamin D 2000IU, Colace, Mag-Ox, Reglan, Miralax/Glycolax Labs reviewed:  Na 127, K 3.1  Diet Order:  DIET SOFT Room  service appropriate? Yes; Fluid consistency: Thin  Skin:  Wound (see comment) (Bilateral ecchymosis (groin and knee))  Last BM:  08/17/2016  Height:   Ht Readings from Last 1 Encounters:  08/20/16 5\' 7"  (1.702 m)    Weight:   Wt Readings from Last 1 Encounters:  08/21/16 108 lb 14.4 oz (49.4 kg)    Ideal Body Weight:  61.36 kg  BMI:  Body mass index is 17.06 kg/m.  Estimated Nutritional Needs:   Kcal:  1220-1320 calories (MSJ x1.2-1.3)  Protein:  74-85 grams (1.5-1.75 g/kg)  Fluid:  >/= 1.5L  EDUCATION NEEDS:   Education needs addressed  Satira Anis. Brooks Kinnan, MS, RD LDN Inpatient Clinical Dietitian Pager [phone]

## 2016-08-21 NOTE — Progress Notes (Signed)
[facility] NAME: Katie Harding    MR#:  [id number]  DATE OF BIRTH:  Jun 08, 1940  SUBJECTIVE: admitted  for hip pain, found to have left pubic ramus, sacral fractures. Still having lots of pain in with  Little movement,   CHIEF COMPLAINT:   Chief Complaint  Patient presents with  . Hip Pain    REVIEW OF SYSTEMS:   ROS CONSTITUTIONAL: No fever, fatigue or weakness.  EYES: No blurred or double vision.  EARS, NOSE, AND THROAT: No tinnitus or ear pain.  RESPIRATORY: No cough, shortness of breath, wheezing or hemoptysis.  CARDIOVASCULAR: No chest pain, orthopnea, edema.  GASTROINTESTINAL: No nausea, vomiting, diarrhea or abdominal pain.  GENITOURINARY: No dysuria, hematuria.  ENDOCRINE: No polyuria, nocturia,  HEMATOLOGY: No anemia, easy bruising or bleeding SKIN: No rash or lesion. MUSCULOSKELETAL: No joint pain or arthritis.   NEUROLOGIC: No tingling, numbness, weakness.  PSYCHIATRY: No anxiety or depression.   DRUG ALLERGIES:  No Known Allergies  VITALS:  Blood pressure 135/79, pulse 70, temperature 97.6 F (36.4 C), temperature source Oral, resp. rate 18, height 5\' 7"  (1.702 m), weight 49.4 kg (108 lb 14.4 oz), SpO2 99 %.  PHYSICAL EXAMINATION:  GENERAL:  76 y.o.-year-old patient lying in the bed with no acute distress.  EYES: Pupils equal, round, reactive to light and accommodation. No scleral icterus. Extraocular muscles intact.  HEENT: Head atraumatic, normocephalic. Oropharynx and nasopharynx clear.  NECK:  Supple, no jugular venous distention. No thyroid enlargement, no tenderness.  LUNGS: Normal breath sounds bilaterally, no wheezing, rales,rhonchi or crepitation. No use of accessory muscles of respiration.  CARDIOVASCULAR: S1, S2 normal. No murmurs, rubs, or gallops.  ABDOMEN: Soft, nontender, nondistended. Bowel sounds present. No organomegaly or mass.  EXTREMITIES: Noted to have tenderness to palpation  along the sacrum, sacroiliac joints bilaterally. No gross deformity of both legs. Range of motion is limited in the left hip. Due to pain. NEUROLOGIC: Cranial nerves II through XII are intact. Upper extremities 5/5, lower extremities, left side having left-sided pain so she does have guarding. Strength on left leg limited because of pain  PSYCHIATRIC: The patient is alert and oriented x 3.  SKIN: No obvious rash, lesion, or ulcer.    LABORATORY PANEL:   CBC  Recent Labs Lab 08/21/16 0508  WBC 7.4  HGB 12.8  HCT 37.0  PLT 277   ------------------------------------------------------------------------------------------------------------------  Chemistries   Recent Labs Lab 08/21/16 0508  NA 127*  K 3.1*  CL 90*  CO2 29  GLUCOSE 91  BUN 15  CREATININE 0.44  CALCIUM 8.5*   ------------------------------------------------------------------------------------------------------------------  Cardiac Enzymes No results for input(s): TROPONINI in the last 168 hours. ------------------------------------------------------------------------------------------------------------------  RADIOLOGY:  Ct Lumbar Spine Wo Contrast  Result Date: 08/20/2016 CLINICAL DATA:  Progressive left hip and leg pain beginning 4 days ago. Fall 5 weeks ago with right hip pain which has subsequently resolved. Unable ambulate. EXAM: CT LUMBAR SPINE WITHOUT CONTRAST TECHNIQUE: Multidetector CT imaging of the lumbar spine was performed without intravenous contrast administration. Multiplanar CT image reconstructions were also generated. COMPARISON:  CT of the abdomen pelvis 11/26/2006. FINDINGS: Segmentation: 5 non rib-bearing lumbar type vertebral bodies are present. Alignment: Grade 1 anterolisthesis is present at L4-5 and at L5-S1. Anterolisthesis at L5-S1 measures 7 mm in the midline. Mild rightward curvature is present in the lower lumbar spine. Vertebrae: Schmorl's nodes are present at L3-4. A chronic  left-sided pars defect is noted at L5. Bilateral sacral  ala fractures are present with minimal displacement. Paraspinal and other soft tissues: Atherosclerotic calcifications are present within the aorta without aneurysm. Cholecystectomy is noted. The urinary bladder is moderately distended. Diverticular changes are present in the sigmoid colon without focal inflammation to suggest diverticulitis. Disc levels: L1-2: Negative. L2-3:  Negative. L3-4:  Negative. L4-5: Mild disc bulging is present. Moderate facet hypertrophy bilaterally contributes to the slight anterolisthesis. L5-S1: Left-sided pars defect is noted. Mild facet hypertrophy is worse on the right. No focal lytic or blastic lesions are present. IMPRESSION: 1. Bilateral acute/subacute sacral insufficiency fractures. 2. 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 3. Slight anterolisthesis at L4-5 secondary to facet degenerative change. 4. Endplate changes at Q4-6. 5. Atherosclerosis. 6. Sigmoid diverticulosis without diverticulitis. Electronically Signed   By: San Morelle M.D.   On: 08/20/2016 11:51   Ct Pelvis Wo Contrast  Result Date: 08/20/2016 CLINICAL DATA:  76 year old female with increasing left hip and leg pain starting 4 days ago. Fell 5 weeks ago with right hip pain which resolved. Initial encounter. EXAM: CT PELVIS WITHOUT CONTRAST TECHNIQUE: Multidetector CT imaging of the pelvis was performed following the standard protocol without intravenous contrast. COMPARISON:  11/26/2006 CT. FINDINGS: Urinary Tract: Noncontrast filled prominent size urinary bladder without obvious abnormality. Inferior aspect of the kidneys with the slight prominence renal pelvis. This may be related to the incomplete emptying of the urinary bladder. Bowel:  Sigmoid diverticulosis. Vascular/Lymphatic: Calcified slightly ectatic iliac arteries. No adenopathy. Reproductive:  Post hysterectomy. Other:  Third spacing of fluid.  Musculoskeletal: Fracture of the left pubic body junction with the inferior and superior left pubic ramus. Some surrounding callus with incomplete healing. Broad fracture of the left and right sacrum with surrounding sclerosis. Mild angulation the anterior margin of the fractured S2 vertebra. Chronic L5 pedicle pars defect with minimal anterior slip L5. Schmorl's node deformity superior endplate L4. IMPRESSION: Fracture of the left pubic body junction with the inferior and superior left pubic ramus. Some surrounding callus with incomplete healing. Broad fracture of the left and right sacrum with surrounding sclerosis. Mild angulation the anterior margin of the fractured S2 vertebra. No hip or acetabular fracture detected. Chronic L5 pedicle pars defect with minimal anterior slip L5. Chronic Schmorl's node deformity superior endplate L4. Third spacing of fluid. Fullness of the urinary bladder may contribute to the minimal fullness of the renal collecting system bilaterally. Electronically Signed   By: Genia Del M.D.   On: 08/20/2016 11:52   Mr Lumbar Spine Wo Contrast  Result Date: 08/20/2016 CLINICAL DATA:  Initial evaluation for acute back pain, recent fall. EXAM: MRI LUMBAR SPINE AND SACRUM WITHOUT CONTRAST TECHNIQUE: Multiplanar, multisequence MR imaging of the lumbar spine and sacrum was performed. No intravenous contrast was administered. COMPARISON:  Prior CT from earlier the same day. FINDINGS: Segmentation: Normal segmentation. Lowest well-formed disc labeled the L5-S1 level. Alignment: Chronic left-sided pars defect at L5 with associated 4 mm spondylolisthesis. Trace 2 mm anterolisthesis of L4 on L5 as well. Vertebral bodies otherwise normally aligned with preservation of the normal lumbar lordosis. Vertebrae: Vertebral body heights are maintained. No evidence for acute vertebral body fracture within the lumbar spine. Nondisplaced fracture of the left transverse process of L5 better seen on prior  CT. Sacral fractures better evaluated on dedicated sacrum portion of this exam. Focal angulation of the anterior margin of S2 again noted. Mild reactive endplate changes present about the L3-4 interspace. Signal intensity within the vertebral body bone marrow otherwise  normal. No discrete or worrisome osseous lesions. Conus medullaris: Extends to the L1 level and appears normal. Paraspinal and other soft tissues: Paraspinous soft tissues within normal limits. Visualized visceral structures within normal limits. Disc levels: L1-2: Minimal annular disc bulge. Otherwise unremarkable without stenosis. L2-3:  Mild circumferential disc bulge.  No stenosis. L3-4: Mild disc bulging. Mild facet and ligamentum flavum hypertrophy. No significant canal or foraminal stenosis. L4-5: 2 mm anterolisthesis of L4 on L5. Minimal circumferential disc bulge. Moderate facet arthrosis. No canal or foraminal stenosis. L5-S1: 4 mm chronic spondylolisthesis of L5 on S1. Right greater than left facet arthrosis. No canal or foraminal stenosis. MRI SACRUM FINDINGS: Acute fractures involving the bilateral sacral ala, more extensive on the right as compared to the left. These are characterized on prior CT as well. No definite intra-articular extension into the bilateral SI joints. Abnormal marrow edema involves nearly the entirety of the sacrum with inferior extension towards the coccyx. No displaced fragments into the sacral neural foramina with no appreciable nerve root impingement. Associated soft tissue edema within mean presacral space involving the bilateral piriformis musculature. Visualized bony pelvis intact. Incidental note made of a few scatter Tarlov cysts. Partially visualized visceral structures within normal limits. IMPRESSION: 1. Acute bilateral sacral fractures, right greater than left. No fragments into the sacral neural foramina or nerve root impingement identified. 2. Associated acute nondisplaced fracture of the left  transverse process of L5, better seen on prior pelvic CT. 3. No other acute traumatic injury within the lumbar spine. 4. 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 5. Mild multilevel degenerative spondylolysis as above without significant stenosis or neural impingement. Electronically Signed   By: Jeannine Boga M.D.   On: 08/20/2016 19:45   Mr Sacrum Si Joints Wo Contrast  Result Date: 08/20/2016 CLINICAL DATA:  Initial evaluation for acute back pain, recent fall. EXAM: MRI LUMBAR SPINE AND SACRUM WITHOUT CONTRAST TECHNIQUE: Multiplanar, multisequence MR imaging of the lumbar spine and sacrum was performed. No intravenous contrast was administered. COMPARISON:  Prior CT from earlier the same day. FINDINGS: Segmentation: Normal segmentation. Lowest well-formed disc labeled the L5-S1 level. Alignment: Chronic left-sided pars defect at L5 with associated 4 mm spondylolisthesis. Trace 2 mm anterolisthesis of L4 on L5 as well. Vertebral bodies otherwise normally aligned with preservation of the normal lumbar lordosis. Vertebrae: Vertebral body heights are maintained. No evidence for acute vertebral body fracture within the lumbar spine. Nondisplaced fracture of the left transverse process of L5 better seen on prior CT. Sacral fractures better evaluated on dedicated sacrum portion of this exam. Focal angulation of the anterior margin of S2 again noted. Mild reactive endplate changes present about the L3-4 interspace. Signal intensity within the vertebral body bone marrow otherwise normal. No discrete or worrisome osseous lesions. Conus medullaris: Extends to the L1 level and appears normal. Paraspinal and other soft tissues: Paraspinous soft tissues within normal limits. Visualized visceral structures within normal limits. Disc levels: L1-2: Minimal annular disc bulge. Otherwise unremarkable without stenosis. L2-3:  Mild circumferential disc bulge.  No stenosis. L3-4: Mild disc bulging. Mild facet and  ligamentum flavum hypertrophy. No significant canal or foraminal stenosis. L4-5: 2 mm anterolisthesis of L4 on L5. Minimal circumferential disc bulge. Moderate facet arthrosis. No canal or foraminal stenosis. L5-S1: 4 mm chronic spondylolisthesis of L5 on S1. Right greater than left facet arthrosis. No canal or foraminal stenosis. MRI SACRUM FINDINGS: Acute fractures involving the bilateral sacral ala, more extensive on the right as compared to the  left. These are characterized on prior CT as well. No definite intra-articular extension into the bilateral SI joints. Abnormal marrow edema involves nearly the entirety of the sacrum with inferior extension towards the coccyx. No displaced fragments into the sacral neural foramina with no appreciable nerve root impingement. Associated soft tissue edema within mean presacral space involving the bilateral piriformis musculature. Visualized bony pelvis intact. Incidental note made of a few scatter Tarlov cysts. Partially visualized visceral structures within normal limits. IMPRESSION: 1. Acute bilateral sacral fractures, right greater than left. No fragments into the sacral neural foramina or nerve root impingement identified. 2. Associated acute nondisplaced fracture of the left transverse process of L5, better seen on prior pelvic CT. 3. No other acute traumatic injury within the lumbar spine. 4. 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 5. Mild multilevel degenerative spondylolysis as above without significant stenosis or neural impingement. Electronically Signed   By: Jeannine Boga M.D.   On: 08/20/2016 19:45    EKG:   Orders placed or performed during the hospital encounter of 08/20/16  . EKG 12-Lead  . EKG 12-Lead    ASSESSMENT AND PLAN:   #1 left superior, inferior pubic ramus fractures, bilateral sacral fractures: Severe pain even with little movement. Patient seen by physical therapy, recommended skilled nursing facility. Continue pain control  today, possible discharge tomorrow. Continue oxycodone 5 MG every 4 hours as needed, continue muscle relaxant. #2 essential hypertension: Controlled. Continue beta blockers  2.hyponatremia:, Hypokalemia, gentle hydration, potassium supplementation. 3 GI, DVT prophylaxis. Discussed with patient's husband also. Likely discharge tomorrow. All the records are reviewed and case discussed with Care Management/Social Workerr. Management plans discussed with the patient, family and they are in agreement.  CODE STATUS: DO NOT RESUSCITATE  TOTAL TIME TAKING CARE OF THIS PATIENT: 35 minutes.   POSSIBLE D/C IN 1-2DAYS, DEPENDING ON CLINICAL CONDITION.   Epifanio Lesches M.D on 08/21/2016 at 1:31 PM  Between 7am to 6pm - Pager - [phone]  After 6pm go to [email or web address] - password [email or web address] [facility]  Office  [phone]  CC: Primary care physician; Rusty Aus, MD   Note: This dictation was prepared with Dragon dictation along with smaller phrase technology. Any transcriptional errors that result from this process are unintentional.

## 2016-08-22 LAB — BASIC METABOLIC PANEL
ANION GAP: 7 (ref 5–15)
BUN: 19 mg/dL (ref 6–20)
CHLORIDE: 93 mmol/L — AB (ref 101–111)
CO2: 29 mmol/L (ref 22–32)
Calcium: 8.2 mg/dL — ABNORMAL LOW (ref 8.9–10.3)
Creatinine, Ser: 0.52 mg/dL (ref 0.44–1.00)
GFR calc non Af Amer: >60 mL/min (ref >60)
GLUCOSE: 97 mg/dL (ref 65–99)
Potassium: 3.9 mmol/L (ref 3.5–5.1)
Sodium: 129 mmol/L — ABNORMAL LOW (ref 135–145)

## 2016-08-22 MED ORDER — METAXALONE 800 MG PO TABS: 800.0000 mg | ORAL_TABLET | Freq: Three times a day (TID) | ORAL | 0 refills | Status: DC | PRN

## 2016-08-22 MED ORDER — ALPRAZOLAM 0.25 MG PO TABS: 0.1250 mg | ORAL_TABLET | Freq: Four times a day (QID) | ORAL | 0 refills | Status: DC

## 2016-08-22 MED ORDER — OXYCODONE HCL 5 MG PO TABS: 5.0000 mg | ORAL_TABLET | Freq: Four times a day (QID) | ORAL | 0 refills | Status: DC | PRN

## 2016-08-22 MED ORDER — POTASSIUM CHLORIDE CRYS ER 20 MEQ PO TBCR
40.0000 meq | EXTENDED_RELEASE_TABLET | Freq: Once | ORAL | Status: AC
  Administered 2016-08-22: 40 meq via ORAL
  Filled 2016-08-22: qty 2

## 2016-08-22 NOTE — Progress Notes (Signed)
[facility] here for pick up.   Refugio Mcconico, RN  

## 2016-08-22 NOTE — Discharge Summary (Signed)
[facility] NAME: Katie Harding    MR#:  [id number]  DATE OF BIRTH:  1940-02-17  DATE OF ADMISSION:  08/20/2016 ADMITTING PHYSICIAN: Loletha Grayer, MD  DATE OF DISCHARGE: 08/22/2016  PRIMARY CARE PHYSICIAN: Rusty Aus, MD   ADMISSION DIAGNOSIS:  Urinary retention [R33.9] Back pain [M54.9] Closed fracture of pubic ramus, unspecified laterality, initial encounter ([facility]) [S32.599A]  DISCHARGE DIAGNOSIS:  Active Problems:   Closed fracture of multiple pubic rami (HCC) Urinary retention Chronic mild hyponatremia HTN  SECONDARY DIAGNOSIS:   Past Medical History:  Diagnosis Date  . #[id number] 2006  . Anxiety   . Breast cancer [facility]) 2006   right breast lumpectomy with radiation  . GERD (gastroesophageal reflux disease)   . MVP (mitral valve prolapse)   . Osteoporosis      ADMITTING HISTORY  HISTORY OF PRESENT ILLNESS:  Katie Harding  is a 76 y.o. female. She presents 5 weeks after a fall off a step off ladder where she fell on her buttock and right side and right shoulder. She's been very sore. It's worse when she sits. She seemed to get a little bit better but on Thursday then had pain in the left hip groin area. She saw 43 assistant and was given prednisone. On Saturday she went to the acute care center was given Flexeril and hydrocodone. She is taking the hydrocodone daily at bedtime. The pain is been worse yesterday and today. Severe in intensity 10 out of 10 at times. She's also had constipation for 3 days. She's had some trouble urinating for the past 2 days but was able to urinate. She had a Foley catheter placed in the ER.   HOSPITAL COURSE:   # left superior, inferior pubic ramus fractures, bilateral sacral fractures Severe pain even with little movement. Patient seen by physical therapy, recommended skilled nursing facility. Continue pain meds and muscle relaxants.  # urinary retention Likely from pain  and narcotics Foley placed. Continue and voiding trial in 1 week.  #2 essential hypertension: Controlled. Continue beta blockers  # chronic mild hyponatremia Stable Repeat Na in 1 week  # constipation due to immobility and pain meds Small BM today AM Will give enema as patient has not had a BM in 5 days Colace scheduled after discharge  Stable for discharge to SNF for PT  CONSULTS OBTAINED:  Treatment Team:  Dereck Leep, MD  DRUG ALLERGIES:  No Known Allergies  DISCHARGE MEDICATIONS:   Current Discharge Medication List    START taking these medications   Details  oxyCODONE (OXY IR/ROXICODONE) 5 MG immediate release tablet Take 1 tablet (5 mg total) by mouth every 6 (six) hours as needed for moderate pain or severe pain. Qty: 20 tablet, Refills: 0      CONTINUE these medications which have CHANGED   Details  ALPRAZolam (XANAX) 0.25 MG tablet Take 0.5 tablets (0.125 mg total) by mouth 4 (four) times daily. Qty: 20 tablet, Refills: 0      CONTINUE these medications which have NOT CHANGED   Details  amoxicillin (AMOXIL) 500 MG capsule Take 500 mg by mouth 4 (four) times daily.    calcium citrate-vitamin D (CITRACAL+D) 315-200 MG-UNIT tablet Take 2 tablets by mouth 2 (two) times daily.     Cholecalciferol (VITAMIN D3) 2000 units TABS Take 2,000 Units by mouth daily.     co-enzyme Q-10 30 MG capsule Take 30 mg by mouth daily.    cyclobenzaprine (FLEXERIL)  10 MG tablet Take 10 mg by mouth 3 (three) times daily as needed for muscle spasms.    diflunisal (DOLOBID) 500 MG TABS tablet Take 500 mg by mouth 2 (two) times daily.    docusate sodium (COLACE) 100 MG capsule Take 100 mg by mouth 2 (two) times daily.    MAGNESIUM OXIDE, ANTACID, PO Take 300 mg by mouth 2 (two) times daily.    metoCLOPramide (REGLAN) 5 MG tablet Take 5 mg by mouth at bedtime.     metoprolol succinate (TOPROL XL) 50 MG 24 hr tablet Take 50 mg by mouth 3 (three) times daily. Take 25mg  qam,  50 mg at lunch, 25mg  with dinner, and 25 mg qhs    Misc Natural Products (GLUCOSAMINE CHOND MSM FORMULA PO) Take 1 tablet by mouth daily.     Multiple Vitamin (MULTIVITAMIN) tablet Take 1 tablet by mouth daily.    omeprazole (PRILOSEC) 40 MG capsule Take 40 mg by mouth 2 (two) times daily before a meal.     predniSONE (DELTASONE) 5 MG tablet Take 5 mg by mouth daily with breakfast.        Today   VITAL SIGNS:  Blood pressure (!) 143/68, pulse 72, temperature 98 F (36.7 C), temperature source Oral, resp. rate 16, height 5\' 7"  (1.702 m), weight 49.4 kg (108 lb 14.4 oz), SpO2 98 %.  I/O:   Intake/Output Summary (Last 24 hours) at 08/22/16 0832 Last data filed at 08/22/16 0654  Gross per 24 hour  Intake           1047.5 ml  Output             1870 ml  Net           -822.5 ml    PHYSICAL EXAMINATION:  Physical Exam  GENERAL:  76 y.o.-year-old patient lying in the bed with no acute distress.  LUNGS: Normal breath sounds bilaterally, no wheezing, rales,rhonchi or crepitation. No use of accessory muscles of respiration.  CARDIOVASCULAR: S1, S2 normal. No murmurs, rubs, or gallops.  ABDOMEN: Soft, non-tender, non-distended. Bowel sounds present. No organomegaly or mass.  NEUROLOGIC: Moves all 4 extremities. PSYCHIATRIC: The patient is alert and oriented x 3.  SKIN: No obvious rash, lesion, or ulcer.   DATA REVIEW:   CBC  Recent Labs Lab 08/21/16 0508  WBC 7.4  HGB 12.8  HCT 37.0  PLT 277    Chemistries   Recent Labs Lab 08/22/16 0736  NA 129*  K 3.9  CL 93*  CO2 29  GLUCOSE 97  BUN 19  CREATININE 0.52  CALCIUM 8.2*    Cardiac Enzymes No results for input(s): TROPONINI in the last 168 hours.  Microbiology Results  No results found for this or any previous visit.  RADIOLOGY:  Ct Lumbar Spine Wo Contrast  Result Date: 08/20/2016 CLINICAL DATA:  Progressive left hip and leg pain beginning 4 days ago. Fall 5 weeks ago with right hip pain which has  subsequently resolved. Unable ambulate. EXAM: CT LUMBAR SPINE WITHOUT CONTRAST TECHNIQUE: Multidetector CT imaging of the lumbar spine was performed without intravenous contrast administration. Multiplanar CT image reconstructions were also generated. COMPARISON:  CT of the abdomen pelvis 11/26/2006. FINDINGS: Segmentation: 5 non rib-bearing lumbar type vertebral bodies are present. Alignment: Grade 1 anterolisthesis is present at L4-5 and at L5-S1. Anterolisthesis at L5-S1 measures 7 mm in the midline. Mild rightward curvature is present in the lower lumbar spine. Vertebrae: Schmorl's nodes are present at L3-4. A chronic left-sided  pars defect is noted at L5. Bilateral sacral ala fractures are present with minimal displacement. Paraspinal and other soft tissues: Atherosclerotic calcifications are present within the aorta without aneurysm. Cholecystectomy is noted. The urinary bladder is moderately distended. Diverticular changes are present in the sigmoid colon without focal inflammation to suggest diverticulitis. Disc levels: L1-2: Negative. L2-3:  Negative. L3-4:  Negative. L4-5: Mild disc bulging is present. Moderate facet hypertrophy bilaterally contributes to the slight anterolisthesis. L5-S1: Left-sided pars defect is noted. Mild facet hypertrophy is worse on the right. No focal lytic or blastic lesions are present. IMPRESSION: 1. Bilateral acute/subacute sacral insufficiency fractures. 2. 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 3. Slight anterolisthesis at L4-5 secondary to facet degenerative change. 4. Endplate changes at N2-7. 5. Atherosclerosis. 6. Sigmoid diverticulosis without diverticulitis. Electronically Signed   By: San Morelle M.D.   On: 08/20/2016 11:51   Ct Pelvis Wo Contrast  Result Date: 08/20/2016 CLINICAL DATA:  76 year old female with increasing left hip and leg pain starting 4 days ago. Fell 5 weeks ago with right hip pain which resolved. Initial  encounter. EXAM: CT PELVIS WITHOUT CONTRAST TECHNIQUE: Multidetector CT imaging of the pelvis was performed following the standard protocol without intravenous contrast. COMPARISON:  11/26/2006 CT. FINDINGS: Urinary Tract: Noncontrast filled prominent size urinary bladder without obvious abnormality. Inferior aspect of the kidneys with the slight prominence renal pelvis. This may be related to the incomplete emptying of the urinary bladder. Bowel:  Sigmoid diverticulosis. Vascular/Lymphatic: Calcified slightly ectatic iliac arteries. No adenopathy. Reproductive:  Post hysterectomy. Other:  Third spacing of fluid. Musculoskeletal: Fracture of the left pubic body junction with the inferior and superior left pubic ramus. Some surrounding callus with incomplete healing. Broad fracture of the left and right sacrum with surrounding sclerosis. Mild angulation the anterior margin of the fractured S2 vertebra. Chronic L5 pedicle pars defect with minimal anterior slip L5. Schmorl's node deformity superior endplate L4. IMPRESSION: Fracture of the left pubic body junction with the inferior and superior left pubic ramus. Some surrounding callus with incomplete healing. Broad fracture of the left and right sacrum with surrounding sclerosis. Mild angulation the anterior margin of the fractured S2 vertebra. No hip or acetabular fracture detected. Chronic L5 pedicle pars defect with minimal anterior slip L5. Chronic Schmorl's node deformity superior endplate L4. Third spacing of fluid. Fullness of the urinary bladder may contribute to the minimal fullness of the renal collecting system bilaterally. Electronically Signed   By: Genia Del M.D.   On: 08/20/2016 11:52   Mr Lumbar Spine Wo Contrast  Result Date: 08/20/2016 CLINICAL DATA:  Initial evaluation for acute back pain, recent fall. EXAM: MRI LUMBAR SPINE AND SACRUM WITHOUT CONTRAST TECHNIQUE: Multiplanar, multisequence MR imaging of the lumbar spine and sacrum was  performed. No intravenous contrast was administered. COMPARISON:  Prior CT from earlier the same day. FINDINGS: Segmentation: Normal segmentation. Lowest well-formed disc labeled the L5-S1 level. Alignment: Chronic left-sided pars defect at L5 with associated 4 mm spondylolisthesis. Trace 2 mm anterolisthesis of L4 on L5 as well. Vertebral bodies otherwise normally aligned with preservation of the normal lumbar lordosis. Vertebrae: Vertebral body heights are maintained. No evidence for acute vertebral body fracture within the lumbar spine. Nondisplaced fracture of the left transverse process of L5 better seen on prior CT. Sacral fractures better evaluated on dedicated sacrum portion of this exam. Focal angulation of the anterior margin of S2 again noted. Mild reactive endplate changes present about the L3-4 interspace. Signal  intensity within the vertebral body bone marrow otherwise normal. No discrete or worrisome osseous lesions. Conus medullaris: Extends to the L1 level and appears normal. Paraspinal and other soft tissues: Paraspinous soft tissues within normal limits. Visualized visceral structures within normal limits. Disc levels: L1-2: Minimal annular disc bulge. Otherwise unremarkable without stenosis. L2-3:  Mild circumferential disc bulge.  No stenosis. L3-4: Mild disc bulging. Mild facet and ligamentum flavum hypertrophy. No significant canal or foraminal stenosis. L4-5: 2 mm anterolisthesis of L4 on L5. Minimal circumferential disc bulge. Moderate facet arthrosis. No canal or foraminal stenosis. L5-S1: 4 mm chronic spondylolisthesis of L5 on S1. Right greater than left facet arthrosis. No canal or foraminal stenosis. MRI SACRUM FINDINGS: Acute fractures involving the bilateral sacral ala, more extensive on the right as compared to the left. These are characterized on prior CT as well. No definite intra-articular extension into the bilateral SI joints. Abnormal marrow edema involves nearly the entirety  of the sacrum with inferior extension towards the coccyx. No displaced fragments into the sacral neural foramina with no appreciable nerve root impingement. Associated soft tissue edema within mean presacral space involving the bilateral piriformis musculature. Visualized bony pelvis intact. Incidental note made of a few scatter Tarlov cysts. Partially visualized visceral structures within normal limits. IMPRESSION: 1. Acute bilateral sacral fractures, right greater than left. No fragments into the sacral neural foramina or nerve root impingement identified. 2. Associated acute nondisplaced fracture of the left transverse process of L5, better seen on prior pelvic CT. 3. No other acute traumatic injury within the lumbar spine. 4. 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 5. Mild multilevel degenerative spondylolysis as above without significant stenosis or neural impingement. Electronically Signed   By: Jeannine Boga M.D.   On: 08/20/2016 19:45   Mr Sacrum Si Joints Wo Contrast  Result Date: 08/20/2016 CLINICAL DATA:  Initial evaluation for acute back pain, recent fall. EXAM: MRI LUMBAR SPINE AND SACRUM WITHOUT CONTRAST TECHNIQUE: Multiplanar, multisequence MR imaging of the lumbar spine and sacrum was performed. No intravenous contrast was administered. COMPARISON:  Prior CT from earlier the same day. FINDINGS: Segmentation: Normal segmentation. Lowest well-formed disc labeled the L5-S1 level. Alignment: Chronic left-sided pars defect at L5 with associated 4 mm spondylolisthesis. Trace 2 mm anterolisthesis of L4 on L5 as well. Vertebral bodies otherwise normally aligned with preservation of the normal lumbar lordosis. Vertebrae: Vertebral body heights are maintained. No evidence for acute vertebral body fracture within the lumbar spine. Nondisplaced fracture of the left transverse process of L5 better seen on prior CT. Sacral fractures better evaluated on dedicated sacrum portion of this exam. Focal  angulation of the anterior margin of S2 again noted. Mild reactive endplate changes present about the L3-4 interspace. Signal intensity within the vertebral body bone marrow otherwise normal. No discrete or worrisome osseous lesions. Conus medullaris: Extends to the L1 level and appears normal. Paraspinal and other soft tissues: Paraspinous soft tissues within normal limits. Visualized visceral structures within normal limits. Disc levels: L1-2: Minimal annular disc bulge. Otherwise unremarkable without stenosis. L2-3:  Mild circumferential disc bulge.  No stenosis. L3-4: Mild disc bulging. Mild facet and ligamentum flavum hypertrophy. No significant canal or foraminal stenosis. L4-5: 2 mm anterolisthesis of L4 on L5. Minimal circumferential disc bulge. Moderate facet arthrosis. No canal or foraminal stenosis. L5-S1: 4 mm chronic spondylolisthesis of L5 on S1. Right greater than left facet arthrosis. No canal or foraminal stenosis. MRI SACRUM FINDINGS: Acute fractures involving the bilateral sacral ala, more  extensive on the right as compared to the left. These are characterized on prior CT as well. No definite intra-articular extension into the bilateral SI joints. Abnormal marrow edema involves nearly the entirety of the sacrum with inferior extension towards the coccyx. No displaced fragments into the sacral neural foramina with no appreciable nerve root impingement. Associated soft tissue edema within mean presacral space involving the bilateral piriformis musculature. Visualized bony pelvis intact. Incidental note made of a few scatter Tarlov cysts. Partially visualized visceral structures within normal limits. IMPRESSION: 1. Acute bilateral sacral fractures, right greater than left. No fragments into the sacral neural foramina or nerve root impingement identified. 2. Associated acute nondisplaced fracture of the left transverse process of L5, better seen on prior pelvic CT. 3. No other acute traumatic injury  within the lumbar spine. 4. 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 5. Mild multilevel degenerative spondylolysis as above without significant stenosis or neural impingement. Electronically Signed   By: Jeannine Boga M.D.   On: 08/20/2016 19:45    Follow up with PCP in 1 week.  Management plans discussed with the patient, family and they are in agreement.  CODE STATUS:     Code Status Orders        Start     Ordered   08/20/16 1326  Do not attempt resuscitation (DNR)  Continuous    Question Answer Comment  In the event of cardiac or respiratory ARREST Do not call a "code blue"   In the event of cardiac or respiratory ARREST Do not perform Intubation, CPR, defibrillation or ACLS   In the event of cardiac or respiratory ARREST Use medication by any route, position, wound care, and other measures to relive pain and suffering. May use oxygen, suction and manual treatment of airway obstruction as needed for comfort.   Comments nurse may pronounce      08/20/16 1326    Code Status History    Date Active Date Inactive Code Status Order ID Comments User Context   This patient has a current code status but no historical code status.    Advance Directive Documentation     Most Recent Value  Type of Advance Directive  Healthcare Power of Attorney  Pre-existing out of facility DNR order (yellow form or pink MOST form)  -  "MOST" Form in Place?  -      TOTAL TIME TAKING CARE OF THIS PATIENT ON DAY OF DISCHARGE: more than 30 minutes.   Hillary Bow R M.D on 08/22/2016 at 8:32 AM  Between 7am to 6pm - Pager - [phone]  After 6pm go to [email or web address] - password [email or web address] [facility]  Office  [phone]  CC: Primary care physician; Rusty Aus, MD  Note: This dictation was prepared with Dragon dictation along with smaller phrase technology. Any transcriptional errors that result from this process are unintentional.

## 2016-08-22 NOTE — Progress Notes (Signed)
Patient alert and oriented. Complaining of pain with movement, relieved by pain medication. Patient also complaining of constipation, received a enema this morning and had a large bowel movement. Patient also drinking miralax and apple juice at this time. RN called and gave report to Educational psychologist at [employer]. All questions answered. AVS in packet. EMS called for transport.   Deri Fuelling, RN

## 2016-08-22 NOTE — Progress Notes (Signed)
Patient is medically stable for D/C to [facility] today. Per [facility] admissions coordinator at [facility] patient can come today to room 208-B. RN will call report at [phone] and arrange EMS for transport. Clinical Education officer, museum (CSW) sent D/C orders to [employer] [facility] via [employer]. Patient is aware of above. Patient's husband Kelin Nixon is at bedside and aware of above. Please reconsult if future social work needs arise. CSW signing off.   [employer], LCSW [phone]

## 2016-08-22 NOTE — Discharge Instructions (Signed)
Regular diet  Activity with assistance.

## 2016-08-22 NOTE — Clinical Social Work Placement (Signed)
   CLINICAL SOCIAL WORK PLACEMENT  NOTE  Date:  08/22/2016  Patient Details  Name: Katie Harding MRN: [id number] Date of Birth: [birth year]  Clinical Social Work is seeking post-discharge placement for this patient at the [place] level of care (*CSW will initial, date and re-position this form in  chart as items are completed):  Yes   Patient/family provided with [facility] Work Department's list of facilities offering this level of care within the geographic area requested by the patient (or if unable, by the patient's family).  Yes   Patient/family informed of their freedom to choose among providers that offer the needed level of care, that participate in Medicare, Medicaid or managed care program needed by the patient, have an available bed and are willing to accept the patient.  Yes   Patient/family informed of [facility]'s ownership interest in [facility] and [facility], as well as of the fact that they are under no obligation to receive care at these facilities.  PASRR submitted to EDS on 08/21/16     PASRR number received on 08/21/16     Existing PASRR number confirmed on       FL2 transmitted to all facilities in geographic area requested by pt/family on 08/21/16     FL2 transmitted to all facilities within larger geographic area on       Patient informed that his/her managed care company has contracts with or will negotiate with certain facilities, including the following:        Yes   Patient/family informed of bed offers received.  Patient chooses bed at  [facility] )     Physician recommends and patient chooses bed at      Patient to be transferred to  [facility] ) on 08/22/16.  Patient to be transferred to facility by  [facility] )     Patient family notified on 08/22/16 of transfer.  Name of family member notified:   (Patient's husband Jeannine Pennisi is at bedside and aware of D/C today. )      PHYSICIAN       Additional Comment:    _______________________________________________ Tani Virgo, Veronia Beets, LCSW 08/22/2016, 10:54 AM

## 2016-08-23 ENCOUNTER — Other Ambulatory Visit: Payer: Self-pay

## 2016-08-23 MED ORDER — OXYCODONE HCL 5 MG PO TABS: 5.0000 mg | ORAL_TABLET | Freq: Four times a day (QID) | ORAL | 0 refills | Status: DC | PRN

## 2016-08-23 NOTE — Telephone Encounter (Signed)
Rx sent to [facility] phone : [phone] , fax : [phone]  

## 2016-08-24 ENCOUNTER — Other Ambulatory Visit: Payer: Self-pay

## 2016-08-24 ENCOUNTER — Other Ambulatory Visit
Admission: RE | Admit: 2016-08-24 | Discharge: 2016-08-24 | Disposition: A | Discharge status: Home / Self Care | Payer: Medicare Other | Source: Skilled Nursing Facility | Attending: Gerontology | Admitting: Gerontology

## 2016-08-24 DIAGNOSIS — E871 Hypo-osmolality and hyponatremia: Secondary | ICD-10-CM | POA: Diagnosis present

## 2016-08-24 LAB — COMPREHENSIVE METABOLIC PANEL
ALBUMIN: 2.8 g/dL — AB (ref 3.5–5.0)
ALK PHOS: 153 U/L — AB (ref 38–126)
ALT: 36 U/L (ref 14–54)
AST: 39 U/L (ref 15–41)
Anion gap: 8 (ref 5–15)
BUN: 13 mg/dL (ref 6–20)
CALCIUM: 8.5 mg/dL — AB (ref 8.9–10.3)
CHLORIDE: 92 mmol/L — AB (ref 101–111)
CO2: 28 mmol/L (ref 22–32)
CREATININE: 0.46 mg/dL (ref 0.44–1.00)
GFR calc non Af Amer: >60 mL/min (ref >60)
GLUCOSE: 88 mg/dL (ref 65–99)
Potassium: 3.9 mmol/L (ref 3.5–5.1)
SODIUM: 128 mmol/L — AB (ref 135–145)
Total Bilirubin: 0.8 mg/dL (ref 0.3–1.2)
Total Protein: 5.5 g/dL — ABNORMAL LOW (ref 6.5–8.1)

## 2016-08-24 MED ORDER — ALPRAZOLAM 0.25 MG PO TABS: 0.1250 mg | ORAL_TABLET | Freq: Four times a day (QID) | ORAL | 1 refills | Status: AC

## 2016-08-24 NOTE — Telephone Encounter (Signed)
Rx sent to [facility] phone : [phone] , fax : [phone]  

## 2016-08-28 ENCOUNTER — Other Ambulatory Visit
Admission: RE | Admit: 2016-08-28 | Discharge: 2016-08-28 | Disposition: A | Discharge status: Home / Self Care | Payer: Medicare Other | Source: Ambulatory Visit | Attending: Gerontology | Admitting: Gerontology

## 2016-08-28 DIAGNOSIS — E871 Hypo-osmolality and hyponatremia: Secondary | ICD-10-CM | POA: Insufficient documentation

## 2016-08-28 LAB — COMPREHENSIVE METABOLIC PANEL
ALT: 26 U/L (ref 14–54)
ANION GAP: 7 (ref 5–15)
AST: 31 U/L (ref 15–41)
Albumin: 3 g/dL — ABNORMAL LOW (ref 3.5–5.0)
Alkaline Phosphatase: 188 U/L — ABNORMAL HIGH (ref 38–126)
BILIRUBIN TOTAL: 0.5 mg/dL (ref 0.3–1.2)
BUN: 13 mg/dL (ref 6–20)
CHLORIDE: 94 mmol/L — AB (ref 101–111)
CO2: 30 mmol/L (ref 22–32)
Calcium: 8.7 mg/dL — ABNORMAL LOW (ref 8.9–10.3)
Creatinine, Ser: 0.37 mg/dL — ABNORMAL LOW (ref 0.44–1.00)
Glucose, Bld: 89 mg/dL (ref 65–99)
POTASSIUM: 3.6 mmol/L (ref 3.5–5.1)
Sodium: 131 mmol/L — ABNORMAL LOW (ref 135–145)
TOTAL PROTEIN: 5.4 g/dL — AB (ref 6.5–8.1)

## 2016-08-29 ENCOUNTER — Other Ambulatory Visit: Payer: Self-pay

## 2016-08-29 ENCOUNTER — Encounter
Admission: RE | Admit: 2016-08-29 | Discharge: 2016-08-29 | Disposition: A | Discharge status: Home / Self Care | Payer: Medicare Other | Source: Ambulatory Visit | Attending: Internal Medicine | Admitting: Internal Medicine

## 2016-08-29 MED ORDER — OXYCODONE HCL 5 MG PO TABS: 5.0000 mg | ORAL_TABLET | ORAL | 0 refills | Status: DC | PRN

## 2016-08-29 NOTE — Telephone Encounter (Signed)
Rx sent to [facility] phone : [phone] , fax : [phone]  

## 2016-08-31 ENCOUNTER — Other Ambulatory Visit
Admission: RE | Admit: 2016-08-31 | Discharge: 2016-08-31 | Disposition: A | Discharge status: Home / Self Care | Payer: Medicare Other | Source: Skilled Nursing Facility | Attending: Internal Medicine | Admitting: Internal Medicine

## 2016-08-31 ENCOUNTER — Encounter: Payer: Self-pay | Admitting: Gerontology

## 2016-08-31 ENCOUNTER — Non-Acute Institutional Stay (SKILLED_NURSING_FACILITY): Payer: Medicare Other | Admitting: Gerontology

## 2016-08-31 DIAGNOSIS — N390 Urinary tract infection, site not specified: Secondary | ICD-10-CM

## 2016-08-31 DIAGNOSIS — R339 Retention of urine, unspecified: Secondary | ICD-10-CM | POA: Diagnosis present

## 2016-08-31 DIAGNOSIS — S32509D Unspecified fracture of unspecified pubis, subsequent encounter for fracture with routine healing: Secondary | ICD-10-CM

## 2016-08-31 DIAGNOSIS — S32599D Other specified fracture of unspecified pubis, subsequent encounter for fracture with routine healing: Secondary | ICD-10-CM

## 2016-08-31 DIAGNOSIS — G629 Polyneuropathy, unspecified: Secondary | ICD-10-CM | POA: Diagnosis not present

## 2016-08-31 LAB — URINALYSIS, COMPLETE (UACMP) WITH MICROSCOPIC
Bilirubin Urine: NEGATIVE
GLUCOSE, UA: NEGATIVE mg/dL
Ketones, ur: NEGATIVE mg/dL
NITRITE: POSITIVE — AB
PROTEIN: 100 mg/dL — AB
SPECIFIC GRAVITY, URINE: 1.011 (ref 1.005–1.030)
Squamous Epithelial / LPF: NONE SEEN
pH: 7 (ref 5.0–8.0)

## 2016-08-31 NOTE — Progress Notes (Signed)
Location:   [facility] Room Number: 208B Place of Service:  SNF [phone]) Provider:  Toni Arthurs, NP-C  Rusty Aus, MD  Patient Care Team: Rusty Aus, MD as PCP - General (Internal Medicine)  Extended Emergency Contact Information Primary Emergency Contact: [name] Address: [address]          [place], [place] [address] [name] of [place] Phone: [phone] Relation: Spouse Secondary Emergency Contact: [name] [place] of [place] Phone: [phone] Relation: Niece  Code Status:  DNR Goals of care: Advanced Directive information Advanced Directives 08/31/2016  Does Patient Have a Medical Advance Directive? Yes  Type of Advance Directive Out of facility DNR (pink MOST or yellow form)  Does patient want to make changes to medical advance directive? No - Patient declined  Copy of [place] in Chart? -     Chief Complaint  Patient presents with  . Medical Management of Chronic Issues    Routine Visit    HPI:  Pt is a 76 y.o. female seen today for follow up. Pt was admitted to the facility for rehab following hospitalization for pubic rami fracture. She has been participating in PT and OT. She reports her pain is fairly well controlled except for numbness and tingling in B- feet. The sx extend from toes to just proximal to the ankles. Decreased response to sharp stimuli on plantar aspect of foot. Able to distinguish sharp/dull sensations correctly approx 50% of the time on plantar and dorsal surfaces of both feet. B- pedal pulses strong and equal. Cap refills WNL. Skin warm. Will obtain xrays to r/o nerve impingement in the lumbar area. Pt also c/o urinary retention/ bladder fullness. Nursing did I&O cath and got 50 mL amber, thick, cloudy urine. Will send for evaluation. Otherwise, pt reports she is feeling well. Appetite is fair. VSS. No other complaints.    Past Medical History:  Diagnosis Date   . #[id number] 2006  . Anxiety   . Breast cancer [facility]) 2006   right breast lumpectomy with radiation  . Colon polyp    adenoma  . GERD (gastroesophageal reflux disease) 04/04/2015  . Heart disease   . History of gastric ulcer 04/2001  . History of right breast cancer    post surgery/ XRT  . MVP (mitral valve prolapse)    dental prophylaxis  . Osteoporosis   . Osteoporosis, post-menopausal   . Surgical menopause    Past Surgical History:  Procedure Laterality Date  . BREAST EXCISIONAL BIOPSY Right 2006   breast ca  . CATARACT EXTRACTION    . CHOLECYSTECTOMY    . 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 COLONOSCOPY  11/29/2004   PH Adenomatous Polyps, [facility] (Brother)  . COLONOSCOPY  03/14/2010   PH Adenomatous Polyps, [facility] (Brother); CBF 03/2015, Ltr mailed 01/19/2015 (dw)  . COLONOSCOPY  05/05/2015   PH Adenomatous Polyps, [facility] (Brother); No repeat due to age per RTE (dw)  . COLONOSCOPY WITH PROPOFOL N/A 05/05/2015   Procedure: COLONOSCOPY WITH PROPOFOL;  Surgeon: Manya Silvas, MD;  Location: [facility] ENDOSCOPY;  Service: Endoscopy;  Laterality: N/A;  . ESOPHAGOGASTRODUODENOSCOPY  12/24/2006   01/06/2004, 08/27/2001, 06/18/2001  . ESOPHAGOGASTRODUODENOSCOPY (EGD) WITH PROPOFOL N/A 05/05/2015   Procedure: ESOPHAGOGASTRODUODENOSCOPY (EGD) WITH PROPOFOL;  Surgeon: Manya Silvas, MD;  Location: [facility] ENDOSCOPY;  Service: Endoscopy;  Laterality: N/A;  . EYE SURGERY     cataracts removed  . MASTECTOMY PARTIAL /  LUMPECTOMY    . NASAL SEPTUM SURGERY    . NASAL SEPTUM SURGERY    . ovarian cyst removed    . ROTATOR CUFF REPAIR Right   . TONSILLECTOMY    . TONSILLECTOMY    . TOTAL ABDOMINAL HYSTERECTOMY W/ BILATERAL SALPINGOOPHORECTOMY  1980s   for endometriosis in 1980s    No Known Allergies  Allergies as of 08/31/2016   No Known Allergies     Medication List       Accurate as of 08/31/16  9:40 AM. Always use your most recent med list.            acetaminophen 325 MG tablet Commonly known as:  TYLENOL Take 650 mg by mouth every 4 (four) hours as needed. For pain / increased temp Max dose for 24 hrs is 3000 mg from all sources of Apap/tyenol   ALPRAZolam 0.25 MG tablet Commonly known as:  XANAX Take 0.5 tablets (0.125 mg total) by mouth 4 (four) times daily.   calcium citrate-vitamin D 315-200 MG-UNIT tablet Commonly known as:  CITRACAL+D Take 2 tablets by mouth 2 (two) times daily.   Cholecalciferol 1000 units capsule Take 2,000 Units by mouth daily. 2 caps   co-enzyme Q-10 30 MG capsule Take 30 mg by mouth daily.   cyclobenzaprine 10 MG tablet Commonly known as:  FLEXERIL Take 10 mg by mouth 3 (three) times daily as needed for muscle spasms.   docusate sodium 100 MG capsule Commonly known as:  COLACE Take 100 mg by mouth 2 (two) times daily.   ENSURE ENLIVE PO Take 1 Bottle by mouth 2 (two) times daily between meals.   feeding supplement (PRO-STAT SUGAR FREE 64) Liqd Take 30 mLs by mouth 2 (two) times daily between meals.   Magnesium Oxide 200 MG Tabs Take 200 mg by mouth 2 (two) times daily.   metoCLOPramide 5 MG tablet Commonly known as:  REGLAN Take 5 mg by mouth at bedtime.   multivitamin tablet Take 1 tablet by mouth daily.   omeprazole 40 MG capsule Commonly known as:  PRILOSEC Take 40 mg by mouth 2 (two) times daily.   oxyCODONE 5 MG immediate release tablet Commonly known as:  Oxy IR/ROXICODONE Take 1 tablet (5 mg total) by mouth every 3 (three) hours as needed for moderate pain or severe pain.   polyethylene glycol packet Commonly known as:  MIRALAX / GLYCOLAX Take 17 g by mouth daily as needed.   senna 8.6 MG tablet Commonly known as:  SENOKOT Take 2 tablets by mouth 2 (two) times daily.   sodium bicarbonate 650 MG tablet Take 975 mg by mouth 2 (two) times daily. 1 and 1/2 tablet   TOPROL XL 50 MG 24 hr tablet Generic drug:  metoprolol succinate Take 50 mg by mouth See admin  instructions. Take 25mg  qam, 50 mg at lunch, 25mg  with dinner, and 25 mg qhs       Review of Systems  Constitutional: Negative for activity change, appetite change, chills, diaphoresis and fever.  HENT: Negative for congestion, sneezing, sore throat, trouble swallowing and voice change.   Respiratory: Negative for apnea, cough, choking, chest tightness, shortness of breath and wheezing.   Cardiovascular: Negative for chest pain, palpitations and leg swelling.  Gastrointestinal: Negative for abdominal distention, abdominal pain, constipation, diarrhea and nausea.  Genitourinary: Positive for difficulty urinating and dysuria. Negative for frequency and urgency.  Musculoskeletal: Positive for arthralgias (typical arthritis), back pain and myalgias. Negative for gait problem.  Skin: Negative  for color change, pallor, rash and wound.  Neurological: Positive for numbness. Negative for dizziness, tremors, syncope, speech difficulty, weakness and headaches.  Psychiatric/Behavioral: Negative for agitation and behavioral problems.  All other systems reviewed and are negative.   Immunization History  Administered Date(s) Administered  . Influenza-Unspecified 10/24/2011  . Pneumococcal Polysaccharide-23 08/20/2002, 12/12/2015  . Zoster 06/04/2005   Pertinent  Health Maintenance Due  Topic Date Due  . DEXA SCAN  02/18/2004  . PNA vac Low Risk Adult (1 of 2 - PCV13) 02/18/2004  . INFLUENZA VACCINE  08/29/2016   No flowsheet data found. Functional Status Survey:    Vitals:   08/31/16 0853  BP: 128/76  Pulse: 66  Resp: 18  Temp: 97.9 F (36.6 C)  SpO2: 98%  Weight: 105 lb 11.2 oz (47.9 kg)  Height: 5\' 7"  (1.702 m)   Body mass index is 16.55 kg/m. Physical Exam  Constitutional: She is oriented to person, place, and time. Vital signs are normal. She appears well-developed and well-nourished. She is active and cooperative. She does not appear ill. No distress.  HENT:  Head:  Normocephalic and atraumatic.  Mouth/Throat: Uvula is midline, oropharynx is clear and moist and mucous membranes are normal. Mucous membranes are not pale, not dry and not cyanotic.  Eyes: Pupils are equal, round, and reactive to light. Conjunctivae, EOM and lids are normal.  Neck: Trachea normal, normal range of motion and full passive range of motion without pain. Neck supple. No JVD present. No tracheal deviation, no edema and no erythema present. No thyromegaly present.  Cardiovascular: Normal rate, regular rhythm, normal heart sounds, intact distal pulses and normal pulses.  Exam reveals no gallop, no distant heart sounds and no friction rub.   No murmur heard. Pulmonary/Chest: Effort normal and breath sounds normal. No accessory muscle usage. No respiratory distress. She has no wheezes. She has no rales. She exhibits no tenderness.  Abdominal: Normal appearance and bowel sounds are normal. She exhibits no distension and no ascites. There is no tenderness.  Musculoskeletal: Normal range of motion. She exhibits no edema or tenderness.  Expected osteoarthritis, stiffness- multiple pubic rami fracture; B-feet numbness and tingling; calves soft, supple; negative Homan's sign  Neurological: She is alert and oriented to person, place, and time. She has normal strength. A sensory deficit is present.  Skin: Skin is warm, dry and intact. No rash noted. She is not diaphoretic. No cyanosis or erythema. No pallor. Nails show no clubbing.  Psychiatric: She has a normal mood and affect. Her speech is normal and behavior is normal. Judgment and thought content normal. Cognition and memory are normal.  Nursing note and vitals reviewed.   Labs reviewed:  Recent Labs  08/22/16 0736 08/24/16 0600 08/28/16 0625  NA 129* 128* 131*  K 3.9 3.9 3.6  CL 93* 92* 94*  CO2 29 28 30   GLUCOSE 97 88 89  BUN 19 13 13   CREATININE 0.52 0.46 0.37*  CALCIUM 8.2* 8.5* 8.7*    Recent Labs  08/24/16 0600  08/28/16 0625  AST 39 31  ALT 36 26  ALKPHOS 153* 188*  BILITOT 0.8 0.5  PROT 5.5* 5.4*  ALBUMIN 2.8* 3.0*    Recent Labs  08/20/16 1531 08/21/16 0508  WBC 9.6 7.4  HGB 13.0 12.8  HCT 37.2 37.0  MCV 88.8 89.6  PLT 259 277   No results found for: TSH No results found for: HGBA1C No results found for: CHOL, HDL, LDLCALC, LDLDIRECT, TRIG, CHOLHDL  Significant Diagnostic  Results in last 30 days:  Ct Lumbar Spine Wo Contrast  Result Date: 08/20/2016 CLINICAL DATA:  Progressive left hip and leg pain beginning 4 days ago. Fall 5 weeks ago with right hip pain which has subsequently resolved. Unable ambulate. EXAM: CT LUMBAR SPINE WITHOUT CONTRAST TECHNIQUE: Multidetector CT imaging of the lumbar spine was performed without intravenous contrast administration. Multiplanar CT image reconstructions were also generated. COMPARISON:  CT of the abdomen pelvis 11/26/2006. FINDINGS: Segmentation: 5 non rib-bearing lumbar type vertebral bodies are present. Alignment: Grade 1 anterolisthesis is present at L4-5 and at L5-S1. Anterolisthesis at L5-S1 measures 7 mm in the midline. Mild rightward curvature is present in the lower lumbar spine. Vertebrae: Schmorl's nodes are present at L3-4. A chronic left-sided pars defect is noted at L5. Bilateral sacral ala fractures are present with minimal displacement. Paraspinal and other soft tissues: Atherosclerotic calcifications are present within the aorta without aneurysm. Cholecystectomy is noted. The urinary bladder is moderately distended. Diverticular changes are present in the sigmoid colon without focal inflammation to suggest diverticulitis. Disc levels: L1-2: Negative. L2-3:  Negative. L3-4:  Negative. L4-5: Mild disc bulging is present. Moderate facet hypertrophy bilaterally contributes to the slight anterolisthesis. L5-S1: Left-sided pars defect is noted. Mild facet hypertrophy is worse on the right. No focal lytic or blastic lesions are present.  IMPRESSION: 1. Bilateral acute/subacute sacral insufficiency fractures. 2. 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 3. Slight anterolisthesis at L4-5 secondary to facet degenerative change. 4. Endplate changes at O2-9. 5. Atherosclerosis. 6. Sigmoid diverticulosis without diverticulitis. Electronically Signed   By: San Morelle M.D.   On: 08/20/2016 11:51   Ct Pelvis Wo Contrast  Result Date: 08/20/2016 CLINICAL DATA:  76 year old female with increasing left hip and leg pain starting 4 days ago. Fell 5 weeks ago with right hip pain which resolved. Initial encounter. EXAM: CT PELVIS WITHOUT CONTRAST TECHNIQUE: Multidetector CT imaging of the pelvis was performed following the standard protocol without intravenous contrast. COMPARISON:  11/26/2006 CT. FINDINGS: Urinary Tract: Noncontrast filled prominent size urinary bladder without obvious abnormality. Inferior aspect of the kidneys with the slight prominence renal pelvis. This may be related to the incomplete emptying of the urinary bladder. Bowel:  Sigmoid diverticulosis. Vascular/Lymphatic: Calcified slightly ectatic iliac arteries. No adenopathy. Reproductive:  Post hysterectomy. Other:  Third spacing of fluid. Musculoskeletal: Fracture of the left pubic body junction with the inferior and superior left pubic ramus. Some surrounding callus with incomplete healing. Broad fracture of the left and right sacrum with surrounding sclerosis. Mild angulation the anterior margin of the fractured S2 vertebra. Chronic L5 pedicle pars defect with minimal anterior slip L5. Schmorl's node deformity superior endplate L4. IMPRESSION: Fracture of the left pubic body junction with the inferior and superior left pubic ramus. Some surrounding callus with incomplete healing. Broad fracture of the left and right sacrum with surrounding sclerosis. Mild angulation the anterior margin of the fractured S2 vertebra. No hip or acetabular fracture  detected. Chronic L5 pedicle pars defect with minimal anterior slip L5. Chronic Schmorl's node deformity superior endplate L4. Third spacing of fluid. Fullness of the urinary bladder may contribute to the minimal fullness of the renal collecting system bilaterally. Electronically Signed   By: Genia Del M.D.   On: 08/20/2016 11:52   Mr Lumbar Spine Wo Contrast  Result Date: 08/20/2016 CLINICAL DATA:  Initial evaluation for acute back pain, recent fall. EXAM: MRI LUMBAR SPINE AND SACRUM WITHOUT CONTRAST TECHNIQUE: Multiplanar, multisequence MR imaging of  the lumbar spine and sacrum was performed. No intravenous contrast was administered. COMPARISON:  Prior CT from earlier the same day. FINDINGS: Segmentation: Normal segmentation. Lowest well-formed disc labeled the L5-S1 level. Alignment: Chronic left-sided pars defect at L5 with associated 4 mm spondylolisthesis. Trace 2 mm anterolisthesis of L4 on L5 as well. Vertebral bodies otherwise normally aligned with preservation of the normal lumbar lordosis. Vertebrae: Vertebral body heights are maintained. No evidence for acute vertebral body fracture within the lumbar spine. Nondisplaced fracture of the left transverse process of L5 better seen on prior CT. Sacral fractures better evaluated on dedicated sacrum portion of this exam. Focal angulation of the anterior margin of S2 again noted. Mild reactive endplate changes present about the L3-4 interspace. Signal intensity within the vertebral body bone marrow otherwise normal. No discrete or worrisome osseous lesions. Conus medullaris: Extends to the L1 level and appears normal. Paraspinal and other soft tissues: Paraspinous soft tissues within normal limits. Visualized visceral structures within normal limits. Disc levels: L1-2: Minimal annular disc bulge. Otherwise unremarkable without stenosis. L2-3:  Mild circumferential disc bulge.  No stenosis. L3-4: Mild disc bulging. Mild facet and ligamentum flavum  hypertrophy. No significant canal or foraminal stenosis. L4-5: 2 mm anterolisthesis of L4 on L5. Minimal circumferential disc bulge. Moderate facet arthrosis. No canal or foraminal stenosis. L5-S1: 4 mm chronic spondylolisthesis of L5 on S1. Right greater than left facet arthrosis. No canal or foraminal stenosis. MRI SACRUM FINDINGS: Acute fractures involving the bilateral sacral ala, more extensive on the right as compared to the left. These are characterized on prior CT as well. No definite intra-articular extension into the bilateral SI joints. Abnormal marrow edema involves nearly the entirety of the sacrum with inferior extension towards the coccyx. No displaced fragments into the sacral neural foramina with no appreciable nerve root impingement. Associated soft tissue edema within mean presacral space involving the bilateral piriformis musculature. Visualized bony pelvis intact. Incidental note made of a few scatter Tarlov cysts. Partially visualized visceral structures within normal limits. IMPRESSION: 1. Acute bilateral sacral fractures, right greater than left. No fragments into the sacral neural foramina or nerve root impingement identified. 2. Associated acute nondisplaced fracture of the left transverse process of L5, better seen on prior pelvic CT. 3. No other acute traumatic injury within the lumbar spine. 4. 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 5. Mild multilevel degenerative spondylolysis as above without significant stenosis or neural impingement. Electronically Signed   By: Jeannine Boga M.D.   On: 08/20/2016 19:45   Mr Sacrum Si Joints Wo Contrast  Result Date: 08/20/2016 CLINICAL DATA:  Initial evaluation for acute back pain, recent fall. EXAM: MRI LUMBAR SPINE AND SACRUM WITHOUT CONTRAST TECHNIQUE: Multiplanar, multisequence MR imaging of the lumbar spine and sacrum was performed. No intravenous contrast was administered. COMPARISON:  Prior CT from earlier the same day.  FINDINGS: Segmentation: Normal segmentation. Lowest well-formed disc labeled the L5-S1 level. Alignment: Chronic left-sided pars defect at L5 with associated 4 mm spondylolisthesis. Trace 2 mm anterolisthesis of L4 on L5 as well. Vertebral bodies otherwise normally aligned with preservation of the normal lumbar lordosis. Vertebrae: Vertebral body heights are maintained. No evidence for acute vertebral body fracture within the lumbar spine. Nondisplaced fracture of the left transverse process of L5 better seen on prior CT. Sacral fractures better evaluated on dedicated sacrum portion of this exam. Focal angulation of the anterior margin of S2 again noted. Mild reactive endplate changes present about the L3-4 interspace. Signal intensity  within the vertebral body bone marrow otherwise normal. No discrete or worrisome osseous lesions. Conus medullaris: Extends to the L1 level and appears normal. Paraspinal and other soft tissues: Paraspinous soft tissues within normal limits. Visualized visceral structures within normal limits. Disc levels: L1-2: Minimal annular disc bulge. Otherwise unremarkable without stenosis. L2-3:  Mild circumferential disc bulge.  No stenosis. L3-4: Mild disc bulging. Mild facet and ligamentum flavum hypertrophy. No significant canal or foraminal stenosis. L4-5: 2 mm anterolisthesis of L4 on L5. Minimal circumferential disc bulge. Moderate facet arthrosis. No canal or foraminal stenosis. L5-S1: 4 mm chronic spondylolisthesis of L5 on S1. Right greater than left facet arthrosis. No canal or foraminal stenosis. MRI SACRUM FINDINGS: Acute fractures involving the bilateral sacral ala, more extensive on the right as compared to the left. These are characterized on prior CT as well. No definite intra-articular extension into the bilateral SI joints. Abnormal marrow edema involves nearly the entirety of the sacrum with inferior extension towards the coccyx. No displaced fragments into the sacral neural  foramina with no appreciable nerve root impingement. Associated soft tissue edema within mean presacral space involving the bilateral piriformis musculature. Visualized bony pelvis intact. Incidental note made of a few scatter Tarlov cysts. Partially visualized visceral structures within normal limits. IMPRESSION: 1. Acute bilateral sacral fractures, right greater than left. No fragments into the sacral neural foramina or nerve root impingement identified. 2. Associated acute nondisplaced fracture of the left transverse process of L5, better seen on prior pelvic CT. 3. No other acute traumatic injury within the lumbar spine. 4. 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 5. Mild multilevel degenerative spondylolysis as above without significant stenosis or neural impingement. Electronically Signed   By: Jeannine Boga M.D.   On: 08/20/2016 19:45    Assessment/Plan 1. Closed fracture of multiple pubic rami with routine healing, subsequent encounter, unspecified laterality  Continue working with PT/OT  Continue exercises as taught by PT/OT  Ice pack prn for pain, edema  Continue Tylenol 650 mg po Q 4 hours prn  Continue Flexeril 10 mg po TID PRN for spasms  Continue Tramadol 50 mg po Q 4 hours prn  Continue Oxycodone 5 mg po Q 3 hours prn  2. Neuropathy  Gabapentin 100 mg po TID  3. Urinary Retention  In & Out catheters Q 6 hours prn for urinary retention  Bladder scan prn  4. Urinary tract infection without hematuria, site unspecified  Cipro 250 mg po Q 12 hours x 5 days  Family/ staff Communication:   Total Time:  Documentation:  Face to Face:  Family/Phone:   Labs/tests ordered:  UA, C&S, Lumbar/sacrum/pelvis xrays  Medication list reviewed and assessed for continued appropriateness. Monthly medication orders reviewed and signed.  Vikki Ports, NP-C Geriatrics [facility] [phone], [place] [address] Cell  Phone (Mon-Fri 8am-5pm):  [phone] On Call:  [phone] & follow prompts after 5pm & weekends Office Phone:  [phone] Office Fax:  [phone]

## 2016-09-02 LAB — URINE CULTURE

## 2016-09-04 ENCOUNTER — Other Ambulatory Visit
Admission: RE | Admit: 2016-09-04 | Discharge: 2016-09-04 | Disposition: A | Discharge status: Home / Self Care | Payer: Medicare Other | Source: Ambulatory Visit | Attending: Gerontology | Admitting: Gerontology

## 2016-09-04 DIAGNOSIS — E871 Hypo-osmolality and hyponatremia: Secondary | ICD-10-CM | POA: Insufficient documentation

## 2016-09-04 LAB — CBC
HCT: 35.9 % (ref 35.0–47.0)
Hemoglobin: 12.7 g/dL (ref 12.0–16.0)
MCH: 31.7 pg (ref 26.0–34.0)
MCHC: 35.4 g/dL (ref 32.0–36.0)
MCV: 89.7 fL (ref 80.0–100.0)
Platelets: 422 10*3/uL (ref 150–440)
RBC: 4 MIL/uL (ref 3.80–5.20)
RDW: 14 % (ref 11.5–14.5)
WBC: 5.5 10*3/uL (ref 3.6–11.0)

## 2016-09-04 LAB — COMPREHENSIVE METABOLIC PANEL
ALBUMIN: 3 g/dL — AB (ref 3.5–5.0)
ALK PHOS: 180 U/L — AB (ref 38–126)
ALT: 16 U/L (ref 14–54)
AST: 20 U/L (ref 15–41)
Anion gap: 6 (ref 5–15)
BUN: 9 mg/dL (ref 6–20)
CALCIUM: 8.8 mg/dL — AB (ref 8.9–10.3)
CO2: 28 mmol/L (ref 22–32)
CREATININE: 0.34 mg/dL — AB (ref 0.44–1.00)
Chloride: 96 mmol/L — ABNORMAL LOW (ref 101–111)
GFR calc Af Amer: >60 mL/min (ref >60)
GFR calc non Af Amer: >60 mL/min (ref >60)
GLUCOSE: 82 mg/dL (ref 65–99)
Potassium: 4.3 mmol/L (ref 3.5–5.1)
SODIUM: 130 mmol/L — AB (ref 135–145)
Total Bilirubin: 0.5 mg/dL (ref 0.3–1.2)
Total Protein: 5.5 g/dL — ABNORMAL LOW (ref 6.5–8.1)

## 2016-09-04 LAB — VITAMIN B12: Vitamin B-12: 660 pg/mL (ref 180–914)

## 2016-09-04 LAB — TSH: TSH: 2.485 u[IU]/mL (ref 0.350–4.500)

## 2016-09-04 LAB — MAGNESIUM: Magnesium: 1.7 mg/dL (ref 1.7–2.4)

## 2016-09-05 LAB — VITAMIN D 25 HYDROXY (VIT D DEFICIENCY, FRACTURES): VIT D 25 HYDROXY: 56.6 ng/mL (ref 30.0–100.0)

## 2016-09-07 ENCOUNTER — Non-Acute Institutional Stay (SKILLED_NURSING_FACILITY): Payer: Medicare Other | Admitting: Gerontology

## 2016-09-07 ENCOUNTER — Encounter: Payer: Self-pay | Admitting: Gerontology

## 2016-09-07 DIAGNOSIS — R339 Retention of urine, unspecified: Secondary | ICD-10-CM

## 2016-09-07 DIAGNOSIS — S32599D Other specified fracture of unspecified pubis, subsequent encounter for fracture with routine healing: Secondary | ICD-10-CM

## 2016-09-07 DIAGNOSIS — N390 Urinary tract infection, site not specified: Secondary | ICD-10-CM | POA: Diagnosis not present

## 2016-09-07 DIAGNOSIS — G629 Polyneuropathy, unspecified: Secondary | ICD-10-CM

## 2016-09-07 DIAGNOSIS — S32509D Unspecified fracture of unspecified pubis, subsequent encounter for fracture with routine healing: Secondary | ICD-10-CM | POA: Diagnosis not present

## 2016-09-07 NOTE — Progress Notes (Signed)
Location:   [facility] Room Number: 210A Place of Service:  SNF [phone]) Provider:  Toni Arthurs, NP-C  Rusty Aus, MD  Patient Care Team: Rusty Aus, MD as PCP - General (Internal Medicine)  Extended Emergency Contact Information Primary Emergency Contact: [name] Address: [address]          [place], [place] [address] [name] of [place] Phone: [phone] Relation: Spouse Secondary Emergency Contact: [name] [place] of [place] Phone: [phone] Mobile Phone: [phone] Relation: Niece  Code Status:  DNR Goals of care: Advanced Directive information Advanced Directives 09/07/2016  Does Patient Have a Medical Advance Directive? Yes  Type of Advance Directive Out of facility DNR (pink MOST or yellow form)  Does patient want to make changes to medical advance directive? No - Patient declined  Copy of Mercedes in Chart? -     Chief Complaint  Patient presents with  . Medical Management of Chronic Issues    Routine Visit    HPI:  Pt is a 76 y.o. female seen today for follow up. Pt was admitted to the facility for rehab following hospitalization for pubic rami fracture. She has been participating in PT and OT. She reports her pain is fairly well controlled except for numbness and tingling in B- feet. The sx extend from toes to just proximal to the ankles. Decreased response to sharp stimuli on plantar aspect of foot. Able to distinguish sharp/dull sensations correctly approx 50% of the time on plantar and dorsal surfaces of both feet. B- pedal pulses strong and equal. Cap refills WNL. Skin warm. However, symptoms have improved since initiation of Gabapentin last week. Xrays of lumbar, sacral spine an  pelvis were all negative.  Pt also c/o urinary retention/ bladder fullness. Nursing did I&O cath and ultimately had to re-insert the foley. Urine sample sent for UA, C&S- found UTI ans was  started on PO antibiotics. Otherwise, pt reports she is feeling well. Appetite is fair. VSS. No other complaints. .     Past Medical History:  Diagnosis Date  . #[id number] 2006  . Anxiety   . Breast cancer [facility]) 2006   right breast lumpectomy with radiation  . Colon polyp    adenoma  . GERD (gastroesophageal reflux disease) 04/04/2015  . Heart disease   . History of gastric ulcer 04/2001  . History of right breast cancer    post surgery/ XRT  . MVP (mitral valve prolapse)    dental prophylaxis  . Osteoporosis   . Osteoporosis, post-menopausal   . Surgical menopause    Past Surgical History:  Procedure Laterality Date  . BREAST EXCISIONAL BIOPSY Right 2006   breast ca  . CATARACT EXTRACTION    . CHOLECYSTECTOMY    . 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 COLONOSCOPY  11/29/2004   PH Adenomatous Polyps, [facility] (Brother)  . COLONOSCOPY  03/14/2010   PH Adenomatous Polyps, [facility] (Brother); CBF 03/2015, Ltr mailed 01/19/2015 (dw)  . COLONOSCOPY  05/05/2015   PH Adenomatous Polyps, [facility] (Brother); No repeat due to age per RTE (dw)  . COLONOSCOPY WITH PROPOFOL N/A 05/05/2015   Procedure: COLONOSCOPY WITH PROPOFOL;  Surgeon: Manya Silvas, MD;  Location: [facility] ENDOSCOPY;  Service: Endoscopy;  Laterality: N/A;  . ESOPHAGOGASTRODUODENOSCOPY  12/24/2006   01/06/2004, 08/27/2001, 06/18/2001  . ESOPHAGOGASTRODUODENOSCOPY (EGD) WITH PROPOFOL N/A 05/05/2015   Procedure: ESOPHAGOGASTRODUODENOSCOPY (EGD) WITH PROPOFOL;  Surgeon: Manya Silvas,  MD;  Location: [facility] ENDOSCOPY;  Service: Endoscopy;  Laterality: N/A;  . EYE SURGERY     cataracts removed  . MASTECTOMY PARTIAL / LUMPECTOMY    . NASAL SEPTUM SURGERY    . NASAL SEPTUM SURGERY    . ovarian cyst removed    . ROTATOR CUFF REPAIR Right   . TONSILLECTOMY    . TONSILLECTOMY    . TOTAL ABDOMINAL HYSTERECTOMY W/ BILATERAL SALPINGOOPHORECTOMY  1980s   for endometriosis in 1980s    No Known Allergies  Allergies  as of 09/07/2016   No Known Allergies     Medication List       Accurate as of 09/07/16  2:19 PM. Always use your most recent med list.          acetaminophen 325 MG tablet Commonly known as:  TYLENOL Take 650 mg by mouth every 4 (four) hours as needed. For pain / increased temp Max dose for 24 hrs is 3000 mg from all sources of Apap/tyenol   ALPRAZolam 0.25 MG tablet Commonly known as:  XANAX Take 0.5 tablets (0.125 mg total) by mouth 4 (four) times daily.   calcium citrate-vitamin D 315-200 MG-UNIT tablet Commonly known as:  CITRACAL+D Take 2 tablets by mouth 2 (two) times daily.   Cholecalciferol 1000 units capsule Take 2,000 Units by mouth daily. 2 caps   co-enzyme Q-10 30 MG capsule Take 30 mg by mouth daily.   cyclobenzaprine 10 MG tablet Commonly known as:  FLEXERIL Take 10 mg by mouth 3 (three) times daily as needed for muscle spasms.   docusate sodium 100 MG capsule Commonly known as:  COLACE Take 100 mg by mouth 2 (two) times daily.   ENSURE ENLIVE PO Take 1 Bottle by mouth 2 (two) times daily between meals.   feeding supplement (PRO-STAT SUGAR FREE 64) Liqd Take 30 mLs by mouth 2 (two) times daily between meals.   gabapentin 100 MG capsule Commonly known as:  NEURONTIN Take 100 mg by mouth 3 (three) times daily.   Magnesium Oxide 200 MG Tabs Take 200 mg by mouth 2 (two) times daily.   metoCLOPramide 5 MG tablet Commonly known as:  REGLAN Take 5 mg by mouth at bedtime.   multivitamin tablet Take 1 tablet by mouth daily.   omeprazole 40 MG capsule Commonly known as:  PRILOSEC Take 40 mg by mouth 2 (two) times daily.   oxyCODONE 5 MG immediate release tablet Commonly known as:  Oxy IR/ROXICODONE Take 1 tablet (5 mg total) by mouth every 3 (three) hours as needed for moderate pain or severe pain.   polyethylene glycol packet Commonly known as:  MIRALAX / GLYCOLAX Take 17 g by mouth daily as needed.   senna 8.6 MG tablet Commonly known as:   SENOKOT Take 2 tablets by mouth 2 (two) times daily.   sodium bicarbonate 650 MG tablet Take 975 mg by mouth 2 (two) times daily. 1 and 1/2 tablet   TOPROL XL 50 MG 24 hr tablet Generic drug:  metoprolol succinate Take 50 mg by mouth See admin instructions. Take 72m qam, 50 mg at lunch, 257mwith dinner, and 25 mg qhs       Review of Systems  Constitutional: Negative for activity change, appetite change, chills, diaphoresis and fever.  HENT: Negative for congestion, sneezing, sore throat, trouble swallowing and voice change.   Respiratory: Negative for apnea, cough, choking, chest tightness, shortness of breath and wheezing.   Cardiovascular: Negative for chest pain, palpitations and  leg swelling.  Gastrointestinal: Negative for abdominal distention, abdominal pain, constipation, diarrhea and nausea.  Genitourinary: Positive for difficulty urinating and dysuria. Negative for frequency and urgency.  Musculoskeletal: Positive for arthralgias (typical arthritis), back pain and myalgias. Negative for gait problem.  Skin: Negative for color change, pallor, rash and wound.  Neurological: Positive for numbness. Negative for dizziness, tremors, syncope, speech difficulty, weakness and headaches.  Psychiatric/Behavioral: Negative for agitation and behavioral problems.  All other systems reviewed and are negative.   Immunization History  Administered Date(s) Administered  . Influenza-Unspecified 10/24/2011  . Pneumococcal Polysaccharide-23 08/20/2002, 12/12/2015  . Zoster 06/04/2005   Pertinent  Health Maintenance Due  Topic Date Due  . DEXA SCAN  02/18/2004  . INFLUENZA VACCINE  08/29/2016  . PNA vac Low Risk Adult (2 of 2 - PCV13) 12/11/2016   No flowsheet data found. Functional Status Survey:    Vitals:   09/07/16 1404  BP: 121/63  Pulse: 89  Resp: 20  Temp: 98.1 F (36.7 C)  SpO2: 97%  Weight: 105 lb 1.6 oz (47.7 kg)  Height: 5' 7"  (1.702 m)   Body mass index is 16.46  kg/m. Physical Exam  Constitutional: She is oriented to person, place, and time. Vital signs are normal. She appears well-developed and well-nourished. She is active and cooperative. She does not appear ill. No distress.  HENT:  Head: Normocephalic and atraumatic.  Mouth/Throat: Uvula is midline, oropharynx is clear and moist and mucous membranes are normal. Mucous membranes are not pale, not dry and not cyanotic.  Eyes: Pupils are equal, round, and reactive to light. Conjunctivae, EOM and lids are normal.  Neck: Trachea normal, normal range of motion and full passive range of motion without pain. Neck supple. No JVD present. No tracheal deviation, no edema and no erythema present. No thyromegaly present.  Cardiovascular: Normal rate, regular rhythm, normal heart sounds, intact distal pulses and normal pulses.  Exam reveals no gallop, no distant heart sounds and no friction rub.   No murmur heard. Pulmonary/Chest: Effort normal and breath sounds normal. No accessory muscle usage. No respiratory distress. She has no wheezes. She has no rales. She exhibits no tenderness.  Abdominal: Normal appearance and bowel sounds are normal. She exhibits no distension and no ascites. There is no tenderness.  Musculoskeletal: Normal range of motion. She exhibits no edema or tenderness.  Expected osteoarthritis, stiffness- multiple pubic rami fracture; B-feet numbness and tingling; calves soft, supple; negative Homan's sign  Neurological: She is alert and oriented to person, place, and time. She has normal strength. A sensory deficit is present.  Skin: Skin is warm, dry and intact. No rash noted. She is not diaphoretic. No cyanosis or erythema. No pallor. Nails show no clubbing.  Psychiatric: She has a normal mood and affect. Her speech is normal and behavior is normal. Judgment and thought content normal. Cognition and memory are normal.  Nursing note and vitals reviewed.   Labs reviewed:  Recent Labs   08/24/16 0600 08/28/16 0625 09/04/16 0454  NA 128* 131* 130*  K 3.9 3.6 4.3  CL 92* 94* 96*  CO2 28 30 28   GLUCOSE 88 89 82  BUN 13 13 9   CREATININE 0.46 0.37* 0.34*  CALCIUM 8.5* 8.7* 8.8*  MG  --   --  1.7    Recent Labs  08/24/16 0600 08/28/16 0625 09/04/16 0454  AST 39 31 20  ALT 36 26 16  ALKPHOS 153* 188* 180*  BILITOT 0.8 0.5 0.5  PROT 5.5*  5.4* 5.5*  ALBUMIN 2.8* 3.0* 3.0*    Recent Labs  08/20/16 1531 08/21/16 0508 09/04/16 0454  WBC 9.6 7.4 5.5  HGB 13.0 12.8 12.7  HCT 37.2 37.0 35.9  MCV 88.8 89.6 89.7  PLT 259 277 422   Lab Results  Component Value Date   TSH 2.485 09/04/2016   No results found for: HGBA1C No results found for: CHOL, HDL, LDLCALC, LDLDIRECT, TRIG, CHOLHDL  Significant Diagnostic Results in last 30 days:  Ct Lumbar Spine Wo Contrast  Result Date: 08/20/2016 CLINICAL DATA:  Progressive left hip and leg pain beginning 4 days ago. Fall 5 weeks ago with right hip pain which has subsequently resolved. Unable ambulate. EXAM: CT LUMBAR SPINE WITHOUT CONTRAST TECHNIQUE: Multidetector CT imaging of the lumbar spine was performed without intravenous contrast administration. Multiplanar CT image reconstructions were also generated. COMPARISON:  CT of the abdomen pelvis 11/26/2006. FINDINGS: Segmentation: 5 non rib-bearing lumbar type vertebral bodies are present. Alignment: Grade 1 anterolisthesis is present at L4-5 and at L5-S1. Anterolisthesis at L5-S1 measures 7 mm in the midline. Mild rightward curvature is present in the lower lumbar spine. Vertebrae: Schmorl's nodes are present at L3-4. A chronic left-sided pars defect is noted at L5. Bilateral sacral ala fractures are present with minimal displacement. Paraspinal and other soft tissues: Atherosclerotic calcifications are present within the aorta without aneurysm. Cholecystectomy is noted. The urinary bladder is moderately distended. Diverticular changes are present in the sigmoid colon without  focal inflammation to suggest diverticulitis. Disc levels: L1-2: Negative. L2-3:  Negative. L3-4:  Negative. L4-5: Mild disc bulging is present. Moderate facet hypertrophy bilaterally contributes to the slight anterolisthesis. L5-S1: Left-sided pars defect is noted. Mild facet hypertrophy is worse on the right. No focal lytic or blastic lesions are present. IMPRESSION: 1. Bilateral acute/subacute sacral insufficiency fractures. 2. 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 3. Slight anterolisthesis at L4-5 secondary to facet degenerative change. 4. Endplate changes at H6-0. 5. Atherosclerosis. 6. Sigmoid diverticulosis without diverticulitis. Electronically Signed   By: San Morelle M.D.   On: 08/20/2016 11:51   Ct Pelvis Wo Contrast  Result Date: 08/20/2016 CLINICAL DATA:  76 year old female with increasing left hip and leg pain starting 4 days ago. Fell 5 weeks ago with right hip pain which resolved. Initial encounter. EXAM: CT PELVIS WITHOUT CONTRAST TECHNIQUE: Multidetector CT imaging of the pelvis was performed following the standard protocol without intravenous contrast. COMPARISON:  11/26/2006 CT. FINDINGS: Urinary Tract: Noncontrast filled prominent size urinary bladder without obvious abnormality. Inferior aspect of the kidneys with the slight prominence renal pelvis. This may be related to the incomplete emptying of the urinary bladder. Bowel:  Sigmoid diverticulosis. Vascular/Lymphatic: Calcified slightly ectatic iliac arteries. No adenopathy. Reproductive:  Post hysterectomy. Other:  Third spacing of fluid. Musculoskeletal: Fracture of the left pubic body junction with the inferior and superior left pubic ramus. Some surrounding callus with incomplete healing. Broad fracture of the left and right sacrum with surrounding sclerosis. Mild angulation the anterior margin of the fractured S2 vertebra. Chronic L5 pedicle pars defect with minimal anterior slip L5. Schmorl's  node deformity superior endplate L4. IMPRESSION: Fracture of the left pubic body junction with the inferior and superior left pubic ramus. Some surrounding callus with incomplete healing. Broad fracture of the left and right sacrum with surrounding sclerosis. Mild angulation the anterior margin of the fractured S2 vertebra. No hip or acetabular fracture detected. Chronic L5 pedicle pars defect with minimal anterior slip L5. Chronic  Schmorl's node deformity superior endplate L4. Third spacing of fluid. Fullness of the urinary bladder may contribute to the minimal fullness of the renal collecting system bilaterally. Electronically Signed   By: Genia Del M.D.   On: 08/20/2016 11:52   Mr Lumbar Spine Wo Contrast  Result Date: 08/20/2016 CLINICAL DATA:  Initial evaluation for acute back pain, recent fall. EXAM: MRI LUMBAR SPINE AND SACRUM WITHOUT CONTRAST TECHNIQUE: Multiplanar, multisequence MR imaging of the lumbar spine and sacrum was performed. No intravenous contrast was administered. COMPARISON:  Prior CT from earlier the same day. FINDINGS: Segmentation: Normal segmentation. Lowest well-formed disc labeled the L5-S1 level. Alignment: Chronic left-sided pars defect at L5 with associated 4 mm spondylolisthesis. Trace 2 mm anterolisthesis of L4 on L5 as well. Vertebral bodies otherwise normally aligned with preservation of the normal lumbar lordosis. Vertebrae: Vertebral body heights are maintained. No evidence for acute vertebral body fracture within the lumbar spine. Nondisplaced fracture of the left transverse process of L5 better seen on prior CT. Sacral fractures better evaluated on dedicated sacrum portion of this exam. Focal angulation of the anterior margin of S2 again noted. Mild reactive endplate changes present about the L3-4 interspace. Signal intensity within the vertebral body bone marrow otherwise normal. No discrete or worrisome osseous lesions. Conus medullaris: Extends to the L1 level and  appears normal. Paraspinal and other soft tissues: Paraspinous soft tissues within normal limits. Visualized visceral structures within normal limits. Disc levels: L1-2: Minimal annular disc bulge. Otherwise unremarkable without stenosis. L2-3:  Mild circumferential disc bulge.  No stenosis. L3-4: Mild disc bulging. Mild facet and ligamentum flavum hypertrophy. No significant canal or foraminal stenosis. L4-5: 2 mm anterolisthesis of L4 on L5. Minimal circumferential disc bulge. Moderate facet arthrosis. No canal or foraminal stenosis. L5-S1: 4 mm chronic spondylolisthesis of L5 on S1. Right greater than left facet arthrosis. No canal or foraminal stenosis. MRI SACRUM FINDINGS: Acute fractures involving the bilateral sacral ala, more extensive on the right as compared to the left. These are characterized on prior CT as well. No definite intra-articular extension into the bilateral SI joints. Abnormal marrow edema involves nearly the entirety of the sacrum with inferior extension towards the coccyx. No displaced fragments into the sacral neural foramina with no appreciable nerve root impingement. Associated soft tissue edema within mean presacral space involving the bilateral piriformis musculature. Visualized bony pelvis intact. Incidental note made of a few scatter Tarlov cysts. Partially visualized visceral structures within normal limits. IMPRESSION: 1. Acute bilateral sacral fractures, right greater than left. No fragments into the sacral neural foramina or nerve root impingement identified. 2. Associated acute nondisplaced fracture of the left transverse process of L5, better seen on prior pelvic CT. 3. No other acute traumatic injury within the lumbar spine. 4. 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 5. Mild multilevel degenerative spondylolysis as above without significant stenosis or neural impingement. Electronically Signed   By: Jeannine Boga M.D.   On: 08/20/2016 19:45   Mr Sacrum Si Joints  Wo Contrast  Result Date: 08/20/2016 CLINICAL DATA:  Initial evaluation for acute back pain, recent fall. EXAM: MRI LUMBAR SPINE AND SACRUM WITHOUT CONTRAST TECHNIQUE: Multiplanar, multisequence MR imaging of the lumbar spine and sacrum was performed. No intravenous contrast was administered. COMPARISON:  Prior CT from earlier the same day. FINDINGS: Segmentation: Normal segmentation. Lowest well-formed disc labeled the L5-S1 level. Alignment: Chronic left-sided pars defect at L5 with associated 4 mm spondylolisthesis. Trace 2 mm anterolisthesis of L4 on L5  as well. Vertebral bodies otherwise normally aligned with preservation of the normal lumbar lordosis. Vertebrae: Vertebral body heights are maintained. No evidence for acute vertebral body fracture within the lumbar spine. Nondisplaced fracture of the left transverse process of L5 better seen on prior CT. Sacral fractures better evaluated on dedicated sacrum portion of this exam. Focal angulation of the anterior margin of S2 again noted. Mild reactive endplate changes present about the L3-4 interspace. Signal intensity within the vertebral body bone marrow otherwise normal. No discrete or worrisome osseous lesions. Conus medullaris: Extends to the L1 level and appears normal. Paraspinal and other soft tissues: Paraspinous soft tissues within normal limits. Visualized visceral structures within normal limits. Disc levels: L1-2: Minimal annular disc bulge. Otherwise unremarkable without stenosis. L2-3:  Mild circumferential disc bulge.  No stenosis. L3-4: Mild disc bulging. Mild facet and ligamentum flavum hypertrophy. No significant canal or foraminal stenosis. L4-5: 2 mm anterolisthesis of L4 on L5. Minimal circumferential disc bulge. Moderate facet arthrosis. No canal or foraminal stenosis. L5-S1: 4 mm chronic spondylolisthesis of L5 on S1. Right greater than left facet arthrosis. No canal or foraminal stenosis. MRI SACRUM FINDINGS: Acute fractures involving  the bilateral sacral ala, more extensive on the right as compared to the left. These are characterized on prior CT as well. No definite intra-articular extension into the bilateral SI joints. Abnormal marrow edema involves nearly the entirety of the sacrum with inferior extension towards the coccyx. No displaced fragments into the sacral neural foramina with no appreciable nerve root impingement. Associated soft tissue edema within mean presacral space involving the bilateral piriformis musculature. Visualized bony pelvis intact. Incidental note made of a few scatter Tarlov cysts. Partially visualized visceral structures within normal limits. IMPRESSION: 1. Acute bilateral sacral fractures, right greater than left. No fragments into the sacral neural foramina or nerve root impingement identified. 2. Associated acute nondisplaced fracture of the left transverse process of L5, better seen on prior pelvic CT. 3. No other acute traumatic injury within the lumbar spine. 4. 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 5. Mild multilevel degenerative spondylolysis as above without significant stenosis or neural impingement. Electronically Signed   By: Jeannine Boga M.D.   On: 08/20/2016 19:45    Assessment/Plan 1. Closed fracture of multiple pubic rami with routine healing, subsequent encounter, unspecified laterality  Continue working with PT/OT  Continue exercises as taught by PT/OT  Ice pack prn for pain, edema  Continue Tylenol 650 mg po Q 4 hours prn  Continue Flexeril 10 mg po TID PRN for spasms  Continue Tramadol 50 mg po Q 4 hours prn  Continue Oxycodone 5 mg po Q 3 hours prn  2. Neuropathy  Increase Gabapentin 200 mg po TID  3. Urinary Retention  Begin bladder training in the AM  DC foley in 2 days  In & Out catheters Q 6 hours prn for urinary retention  Bladder scan prn  4. Urinary tract infection without hematuria, site unspecified  Cipro 250 mg po Q 12 hours x 5 days-  completed  Resolved   Family/ staff Communication:   Total Time:  Documentation:  Face to Face:  Family/Phone:   Labs/tests ordered:  Labs done on 8/7- reviewed (cbc, met c, B12, D, TSH, Mag+)  Medication list reviewed and assessed for continued appropriateness. Monthly medication orders reviewed and signed.  Vikki Ports, NP-C Geriatrics [facility] [phone], [address] [address] Cell Phone (Mon-Fri 8am-5pm):  [phone]  On Call:  [phone] & follow prompts after 5pm & weekends Office Phone:  [phone] Office Fax:  [phone]

## 2016-09-12 ENCOUNTER — Non-Acute Institutional Stay (SKILLED_NURSING_FACILITY): Payer: Medicare Other | Admitting: Gerontology

## 2016-09-12 ENCOUNTER — Other Ambulatory Visit: Payer: Self-pay

## 2016-09-12 DIAGNOSIS — S32509D Unspecified fracture of unspecified pubis, subsequent encounter for fracture with routine healing: Secondary | ICD-10-CM

## 2016-09-12 DIAGNOSIS — G629 Polyneuropathy, unspecified: Secondary | ICD-10-CM

## 2016-09-12 DIAGNOSIS — R339 Retention of urine, unspecified: Secondary | ICD-10-CM | POA: Diagnosis not present

## 2016-09-12 DIAGNOSIS — S32599D Other specified fracture of unspecified pubis, subsequent encounter for fracture with routine healing: Secondary | ICD-10-CM

## 2016-09-12 MED ORDER — OXYCODONE HCL 5 MG PO TABS: 5.0000 mg | ORAL_TABLET | ORAL | 0 refills | Status: DC | PRN

## 2016-09-12 NOTE — Telephone Encounter (Signed)
Rx sent to [facility] phone : [phone] , fax : [phone]  

## 2016-09-13 ENCOUNTER — Encounter: Payer: Self-pay | Admitting: Gerontology

## 2016-09-13 NOTE — Progress Notes (Signed)
Location:   [facility] Room Number: 210A Place of Service:  SNF [phone]) Provider:  Toni Arthurs, NP-C  Rusty Aus, MD  Patient Care Team: Rusty Aus, MD as PCP - General (Internal Medicine)  Extended Emergency Contact Information Primary Emergency Contact: [name] Address: [address]          [place], [place] [address] [name] of [place] Phone: [phone] Relation: Spouse Secondary Emergency Contact: [name] [place] of [place] Phone: [phone] Mobile Phone: [phone] Relation: Niece  Code Status:  DNR Goals of care: Advanced Directive information Advanced Directives 09/13/2016  Does Patient Have a Medical Advance Directive? Yes  Type of Advance Directive Out of facility DNR (pink MOST or yellow form)  Does patient want to make changes to medical advance directive? No - Patient declined  Copy of [facility] in Chart? -     Chief Complaint  Patient presents with  . Medical Management of Chronic Issues    Routine Visit    HPI:  Pt is a 76 y.o. female seen today for follow up. Pt was admitted to the facility for rehab following hospitalization for pubic rami fracture. She has been participating in PT and OT. She reports her pain is very well controlled except for numbness and tingling in B- feet which she reports is still there but has improved. She requests today to have her pain medications changed from Oxycodone to Tramadol.  B- pedal pulses strong and equal. Cap refills WNL. Skin warm. Xrays of lumbar, sacral spine an  pelvis were all negative. Nursing ultimately had to re-insert the foley for retention r/t UTI. She has completed an appropriate course of antibiotics for the UTI and is now without symptoms. Otherwise, pt reports she is feeling well. Appetite is good. She is looking forward to going home soon. VSS. No other complaints.      Past Medical History:  Diagnosis Date  .  #[id number] 2006  . Anxiety   . Breast cancer [facility]) 2006   right breast lumpectomy with radiation  . Colon polyp    adenoma  . GERD (gastroesophageal reflux disease) 04/04/2015  . Heart disease   . History of gastric ulcer 04/2001  . History of right breast cancer    post surgery/ XRT  . MVP (mitral valve prolapse)    dental prophylaxis  . Osteoporosis   . Osteoporosis, post-menopausal   . Surgical menopause    Past Surgical History:  Procedure Laterality Date  . BREAST EXCISIONAL BIOPSY Right 2006   breast ca  . CATARACT EXTRACTION    . CHOLECYSTECTOMY    . 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 COLONOSCOPY  11/29/2004   PH Adenomatous Polyps, [facility] (Brother)  . COLONOSCOPY  03/14/2010   PH Adenomatous Polyps, [facility] (Brother); CBF 03/2015, Ltr mailed 01/19/2015 (dw)  . COLONOSCOPY  05/05/2015   PH Adenomatous Polyps, [facility] (Brother); No repeat due to age per RTE (dw)  . COLONOSCOPY WITH PROPOFOL N/A 05/05/2015   Procedure: COLONOSCOPY WITH PROPOFOL;  Surgeon: Manya Silvas, MD;  Location: [facility] ENDOSCOPY;  Service: Endoscopy;  Laterality: N/A;  . ESOPHAGOGASTRODUODENOSCOPY  12/24/2006   01/06/2004, 08/27/2001, 06/18/2001  . ESOPHAGOGASTRODUODENOSCOPY (EGD) WITH PROPOFOL N/A 05/05/2015   Procedure: ESOPHAGOGASTRODUODENOSCOPY (EGD) WITH PROPOFOL;  Surgeon: Manya Silvas, MD;  Location: [facility] ENDOSCOPY;  Service: Endoscopy;  Laterality: N/A;  . EYE SURGERY     cataracts removed  . MASTECTOMY PARTIAL /  LUMPECTOMY    . NASAL SEPTUM SURGERY    . NASAL SEPTUM SURGERY    . ovarian cyst removed    . ROTATOR CUFF REPAIR Right   . TONSILLECTOMY    . TONSILLECTOMY    . TOTAL ABDOMINAL HYSTERECTOMY W/ BILATERAL SALPINGOOPHORECTOMY  1980s   for endometriosis in 1980s    No Known Allergies  Allergies as of 09/12/2016   No Known Allergies     Medication List       Accurate as of 09/12/16 11:59 PM. Always use your most recent med list.            acetaminophen 325 MG tablet Commonly known as:  TYLENOL Take 650 mg by mouth every 4 (four) hours as needed. For pain / increased temp Max dose for 24 hrs is 3000 mg from all sources of Apap/tyenol   ALPRAZolam 0.25 MG tablet Commonly known as:  XANAX Take 0.5 tablets (0.125 mg total) by mouth 4 (four) times daily.   calcium citrate-vitamin D 315-200 MG-UNIT tablet Commonly known as:  CITRACAL+D Take 2 tablets by mouth 2 (two) times daily.   Cholecalciferol 1000 units capsule Take 2,000 Units by mouth daily. 2 caps   co-enzyme Q-10 30 MG capsule Take 30 mg by mouth daily.   cyclobenzaprine 10 MG tablet Commonly known as:  FLEXERIL Take 10 mg by mouth 3 (three) times daily as needed for muscle spasms.   docusate sodium 100 MG capsule Commonly known as:  COLACE Take 100 mg by mouth 2 (two) times daily.   ENSURE ENLIVE PO Take 1 Bottle by mouth 2 (two) times daily between meals.   feeding supplement (PRO-STAT SUGAR FREE 64) Liqd Take 30 mLs by mouth 2 (two) times daily between meals.   gabapentin 100 MG capsule Commonly known as:  NEURONTIN Take 100 mg by mouth 3 (three) times daily.   Magnesium Oxide 200 MG Tabs Take 200 mg by mouth 2 (two) times daily.   metoCLOPramide 5 MG tablet Commonly known as:  REGLAN Take 5 mg by mouth at bedtime.   multivitamin tablet Take 1 tablet by mouth daily.   omeprazole 40 MG capsule Commonly known as:  PRILOSEC Take 40 mg by mouth 2 (two) times daily.   polyethylene glycol packet Commonly known as:  MIRALAX / GLYCOLAX Take 17 g by mouth daily as needed.   senna 8.6 MG tablet Commonly known as:  SENOKOT Take 2 tablets by mouth 2 (two) times daily.   sodium bicarbonate 650 MG tablet Take 975 mg by mouth 2 (two) times daily. 1 and 1/2 tablet   TOPROL XL 50 MG 24 hr tablet Generic drug:  metoprolol succinate Take 50 mg by mouth See admin instructions. Take 1/2 tablet (25 mg) q a.m., 1 whole tablet (50 mg)  at lunch, 1/2  tablet (25mg )  with dinner, and 1/2 tablet (25 mg) at beditime   traMADol 50 MG tablet Commonly known as:  ULTRAM Take 50 mg by mouth every 4 (four) hours as needed.       Review of Systems  Constitutional: Negative for activity change, appetite change, chills, diaphoresis and fever.  HENT: Negative for congestion, sneezing, sore throat, trouble swallowing and voice change.   Respiratory: Negative for apnea, cough, choking, chest tightness, shortness of breath and wheezing.   Cardiovascular: Negative for chest pain, palpitations and leg swelling.  Gastrointestinal: Negative for abdominal distention, abdominal pain, constipation, diarrhea and nausea.  Genitourinary: Negative for difficulty urinating, dysuria, frequency and urgency.  Musculoskeletal: Positive for arthralgias (typical arthritis), back pain and myalgias. Negative for gait problem.  Skin: Negative for color change, pallor, rash and wound.  Neurological: Positive for numbness. Negative for dizziness, tremors, syncope, speech difficulty, weakness and headaches.  Psychiatric/Behavioral: Negative for agitation and behavioral problems.  All other systems reviewed and are negative.   Immunization History  Administered Date(s) Administered  . Influenza-Unspecified 10/24/2011  . Pneumococcal Polysaccharide-23 08/20/2002, 12/12/2015  . Zoster 06/04/2005   Pertinent  Health Maintenance Due  Topic Date Due  . DEXA SCAN  02/18/2004  . INFLUENZA VACCINE  08/29/2016  . PNA vac Low Risk Adult (2 of 2 - PCV13) 12/11/2016   No flowsheet data found. Functional Status Survey:    Vitals:   09/12/16 1419  BP: 130/64  Pulse: 74  Resp: 17  Temp: 98.4 F (36.9 C)  SpO2: 95%  Weight: 103 lb 11.2 oz (47 kg)  Height: 5\' 7"  (1.702 m)   Body mass index is 16.24 kg/m. Physical Exam  Constitutional: She is oriented to person, place, and time. Vital signs are normal. She appears well-developed and well-nourished. She is active and  cooperative. She does not appear ill. No distress.  HENT:  Head: Normocephalic and atraumatic.  Mouth/Throat: Uvula is midline, oropharynx is clear and moist and mucous membranes are normal. Mucous membranes are not pale, not dry and not cyanotic.  Eyes: Pupils are equal, round, and reactive to light. Conjunctivae, EOM and lids are normal.  Neck: Trachea normal, normal range of motion and full passive range of motion without pain. Neck supple. No JVD present. No tracheal deviation, no edema and no erythema present. No thyromegaly present.  Cardiovascular: Normal rate, regular rhythm, normal heart sounds, intact distal pulses and normal pulses.  Exam reveals no gallop, no distant heart sounds and no friction rub.   No murmur heard. Pulmonary/Chest: Effort normal and breath sounds normal. No accessory muscle usage. No respiratory distress. She has no wheezes. She has no rales. She exhibits no tenderness.  Abdominal: Normal appearance and bowel sounds are normal. She exhibits no distension and no ascites. There is no tenderness.  Musculoskeletal: Normal range of motion. She exhibits no edema or tenderness.  Expected osteoarthritis, stiffness- multiple pubic rami fracture; B-feet numbness and tingling; calves soft, supple; negative Homan's sign  Neurological: She is alert and oriented to person, place, and time. She has normal strength. A sensory deficit is present.  Skin: Skin is warm, dry and intact. No rash noted. She is not diaphoretic. No cyanosis or erythema. No pallor. Nails show no clubbing.  Psychiatric: She has a normal mood and affect. Her speech is normal and behavior is normal. Judgment and thought content normal. Cognition and memory are normal.  Nursing note and vitals reviewed.   Labs reviewed:  Recent Labs  08/24/16 0600 08/28/16 0625 09/04/16 0454  NA 128* 131* 130*  K 3.9 3.6 4.3  CL 92* 94* 96*  CO2 28 30 28   GLUCOSE 88 89 82  BUN 13 13 9   CREATININE 0.46 0.37* 0.34*    CALCIUM 8.5* 8.7* 8.8*  MG  --   --  1.7    Recent Labs  08/24/16 0600 08/28/16 0625 09/04/16 0454  AST 39 31 20  ALT 36 26 16  ALKPHOS 153* 188* 180*  BILITOT 0.8 0.5 0.5  PROT 5.5* 5.4* 5.5*  ALBUMIN 2.8* 3.0* 3.0*    Recent Labs  08/20/16 1531 08/21/16 0508 09/04/16 0454  WBC 9.6 7.4 5.5  HGB 13.0  12.8 12.7  HCT 37.2 37.0 35.9  MCV 88.8 89.6 89.7  PLT 259 277 422   Lab Results  Component Value Date   TSH 2.485 09/04/2016   No results found for: HGBA1C No results found for: CHOL, HDL, LDLCALC, LDLDIRECT, TRIG, CHOLHDL  Significant Diagnostic Results in last 30 days:  Ct Lumbar Spine Wo Contrast  Result Date: 08/20/2016 CLINICAL DATA:  Progressive left hip and leg pain beginning 4 days ago. Fall 5 weeks ago with right hip pain which has subsequently resolved. Unable ambulate. EXAM: CT LUMBAR SPINE WITHOUT CONTRAST TECHNIQUE: Multidetector CT imaging of the lumbar spine was performed without intravenous contrast administration. Multiplanar CT image reconstructions were also generated. COMPARISON:  CT of the abdomen pelvis 11/26/2006. FINDINGS: Segmentation: 5 non rib-bearing lumbar type vertebral bodies are present. Alignment: Grade 1 anterolisthesis is present at L4-5 and at L5-S1. Anterolisthesis at L5-S1 measures 7 mm in the midline. Mild rightward curvature is present in the lower lumbar spine. Vertebrae: Schmorl's nodes are present at L3-4. A chronic left-sided pars defect is noted at L5. Bilateral sacral ala fractures are present with minimal displacement. Paraspinal and other soft tissues: Atherosclerotic calcifications are present within the aorta without aneurysm. Cholecystectomy is noted. The urinary bladder is moderately distended. Diverticular changes are present in the sigmoid colon without focal inflammation to suggest diverticulitis. Disc levels: L1-2: Negative. L2-3:  Negative. L3-4:  Negative. L4-5: Mild disc bulging is present. Moderate facet hypertrophy  bilaterally contributes to the slight anterolisthesis. L5-S1: Left-sided pars defect is noted. Mild facet hypertrophy is worse on the right. No focal lytic or blastic lesions are present. IMPRESSION: 1. Bilateral acute/subacute sacral insufficiency fractures. 2. 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 3. Slight anterolisthesis at L4-5 secondary to facet degenerative change. 4. Endplate changes at A7-6. 5. Atherosclerosis. 6. Sigmoid diverticulosis without diverticulitis. Electronically Signed   By: San Morelle M.D.   On: 08/20/2016 11:51   Ct Pelvis Wo Contrast  Result Date: 08/20/2016 CLINICAL DATA:  76 year old female with increasing left hip and leg pain starting 4 days ago. Fell 5 weeks ago with right hip pain which resolved. Initial encounter. EXAM: CT PELVIS WITHOUT CONTRAST TECHNIQUE: Multidetector CT imaging of the pelvis was performed following the standard protocol without intravenous contrast. COMPARISON:  11/26/2006 CT. FINDINGS: Urinary Tract: Noncontrast filled prominent size urinary bladder without obvious abnormality. Inferior aspect of the kidneys with the slight prominence renal pelvis. This may be related to the incomplete emptying of the urinary bladder. Bowel:  Sigmoid diverticulosis. Vascular/Lymphatic: Calcified slightly ectatic iliac arteries. No adenopathy. Reproductive:  Post hysterectomy. Other:  Third spacing of fluid. Musculoskeletal: Fracture of the left pubic body junction with the inferior and superior left pubic ramus. Some surrounding callus with incomplete healing. Broad fracture of the left and right sacrum with surrounding sclerosis. Mild angulation the anterior margin of the fractured S2 vertebra. Chronic L5 pedicle pars defect with minimal anterior slip L5. Schmorl's node deformity superior endplate L4. IMPRESSION: Fracture of the left pubic body junction with the inferior and superior left pubic ramus. Some surrounding callus with  incomplete healing. Broad fracture of the left and right sacrum with surrounding sclerosis. Mild angulation the anterior margin of the fractured S2 vertebra. No hip or acetabular fracture detected. Chronic L5 pedicle pars defect with minimal anterior slip L5. Chronic Schmorl's node deformity superior endplate L4. Third spacing of fluid. Fullness of the urinary bladder may contribute to the minimal fullness of the renal collecting system bilaterally.  Electronically Signed   By: Genia Del M.D.   On: 08/20/2016 11:52   Mr Lumbar Spine Wo Contrast  Result Date: 08/20/2016 CLINICAL DATA:  Initial evaluation for acute back pain, recent fall. EXAM: MRI LUMBAR SPINE AND SACRUM WITHOUT CONTRAST TECHNIQUE: Multiplanar, multisequence MR imaging of the lumbar spine and sacrum was performed. No intravenous contrast was administered. COMPARISON:  Prior CT from earlier the same day. FINDINGS: Segmentation: Normal segmentation. Lowest well-formed disc labeled the L5-S1 level. Alignment: Chronic left-sided pars defect at L5 with associated 4 mm spondylolisthesis. Trace 2 mm anterolisthesis of L4 on L5 as well. Vertebral bodies otherwise normally aligned with preservation of the normal lumbar lordosis. Vertebrae: Vertebral body heights are maintained. No evidence for acute vertebral body fracture within the lumbar spine. Nondisplaced fracture of the left transverse process of L5 better seen on prior CT. Sacral fractures better evaluated on dedicated sacrum portion of this exam. Focal angulation of the anterior margin of S2 again noted. Mild reactive endplate changes present about the L3-4 interspace. Signal intensity within the vertebral body bone marrow otherwise normal. No discrete or worrisome osseous lesions. Conus medullaris: Extends to the L1 level and appears normal. Paraspinal and other soft tissues: Paraspinous soft tissues within normal limits. Visualized visceral structures within normal limits. Disc levels: L1-2:  Minimal annular disc bulge. Otherwise unremarkable without stenosis. L2-3:  Mild circumferential disc bulge.  No stenosis. L3-4: Mild disc bulging. Mild facet and ligamentum flavum hypertrophy. No significant canal or foraminal stenosis. L4-5: 2 mm anterolisthesis of L4 on L5. Minimal circumferential disc bulge. Moderate facet arthrosis. No canal or foraminal stenosis. L5-S1: 4 mm chronic spondylolisthesis of L5 on S1. Right greater than left facet arthrosis. No canal or foraminal stenosis. MRI SACRUM FINDINGS: Acute fractures involving the bilateral sacral ala, more extensive on the right as compared to the left. These are characterized on prior CT as well. No definite intra-articular extension into the bilateral SI joints. Abnormal marrow edema involves nearly the entirety of the sacrum with inferior extension towards the coccyx. No displaced fragments into the sacral neural foramina with no appreciable nerve root impingement. Associated soft tissue edema within mean presacral space involving the bilateral piriformis musculature. Visualized bony pelvis intact. Incidental note made of a few scatter Tarlov cysts. Partially visualized visceral structures within normal limits. IMPRESSION: 1. Acute bilateral sacral fractures, right greater than left. No fragments into the sacral neural foramina or nerve root impingement identified. 2. Associated acute nondisplaced fracture of the left transverse process of L5, better seen on prior pelvic CT. 3. No other acute traumatic injury within the lumbar spine. 4. 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 5. Mild multilevel degenerative spondylolysis as above without significant stenosis or neural impingement. Electronically Signed   By: Jeannine Boga M.D.   On: 08/20/2016 19:45   Mr Sacrum Si Joints Wo Contrast  Result Date: 08/20/2016 CLINICAL DATA:  Initial evaluation for acute back pain, recent fall. EXAM: MRI LUMBAR SPINE AND SACRUM WITHOUT CONTRAST TECHNIQUE:  Multiplanar, multisequence MR imaging of the lumbar spine and sacrum was performed. No intravenous contrast was administered. COMPARISON:  Prior CT from earlier the same day. FINDINGS: Segmentation: Normal segmentation. Lowest well-formed disc labeled the L5-S1 level. Alignment: Chronic left-sided pars defect at L5 with associated 4 mm spondylolisthesis. Trace 2 mm anterolisthesis of L4 on L5 as well. Vertebral bodies otherwise normally aligned with preservation of the normal lumbar lordosis. Vertebrae: Vertebral body heights are maintained. No evidence for acute vertebral body fracture  within the lumbar spine. Nondisplaced fracture of the left transverse process of L5 better seen on prior CT. Sacral fractures better evaluated on dedicated sacrum portion of this exam. Focal angulation of the anterior margin of S2 again noted. Mild reactive endplate changes present about the L3-4 interspace. Signal intensity within the vertebral body bone marrow otherwise normal. No discrete or worrisome osseous lesions. Conus medullaris: Extends to the L1 level and appears normal. Paraspinal and other soft tissues: Paraspinous soft tissues within normal limits. Visualized visceral structures within normal limits. Disc levels: L1-2: Minimal annular disc bulge. Otherwise unremarkable without stenosis. L2-3:  Mild circumferential disc bulge.  No stenosis. L3-4: Mild disc bulging. Mild facet and ligamentum flavum hypertrophy. No significant canal or foraminal stenosis. L4-5: 2 mm anterolisthesis of L4 on L5. Minimal circumferential disc bulge. Moderate facet arthrosis. No canal or foraminal stenosis. L5-S1: 4 mm chronic spondylolisthesis of L5 on S1. Right greater than left facet arthrosis. No canal or foraminal stenosis. MRI SACRUM FINDINGS: Acute fractures involving the bilateral sacral ala, more extensive on the right as compared to the left. These are characterized on prior CT as well. No definite intra-articular extension into the  bilateral SI joints. Abnormal marrow edema involves nearly the entirety of the sacrum with inferior extension towards the coccyx. No displaced fragments into the sacral neural foramina with no appreciable nerve root impingement. Associated soft tissue edema within mean presacral space involving the bilateral piriformis musculature. Visualized bony pelvis intact. Incidental note made of a few scatter Tarlov cysts. Partially visualized visceral structures within normal limits. IMPRESSION: 1. Acute bilateral sacral fractures, right greater than left. No fragments into the sacral neural foramina or nerve root impingement identified. 2. Associated acute nondisplaced fracture of the left transverse process of L5, better seen on prior pelvic CT. 3. No other acute traumatic injury within the lumbar spine. 4. 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 5. Mild multilevel degenerative spondylolysis as above without significant stenosis or neural impingement. Electronically Signed   By: Jeannine Boga M.D.   On: 08/20/2016 19:45    Assessment/Plan 1. Closed fracture of multiple pubic rami with routine healing, subsequent encounter, unspecified laterality  Continue working with PT/OT  Continue exercises as taught by PT/OT  Ice pack prn for pain, edema  Continue Tylenol 650 mg po Q 4 hours prn  Continue Flexeril 10 mg po TID PRN for spasms  Continue Tramadol 50 mg po Q 4 hours prn  Discontinue Oxycodone 5 mg po Q 3 hours prn  2. Neuropathy  Increase Gabapentin 300 mg po TID  3. Urinary Retention  Resolved   In & Out catheters Q 6 hours prn for urinary retention  Bladder scan prn   Family/ staff Communication:   Total Time:  Documentation:  Face to Face:  Family/Phone:   Labs/tests ordered:   Medication list reviewed and assessed for continued appropriateness. Monthly medication orders reviewed and signed.  Vikki Ports, NP-C Geriatrics [facility] [phone], [place] [address] Cell Phone (Mon-Fri 8am-5pm):  [phone] On Call:  [phone] & follow prompts after 5pm & weekends Office Phone:  [phone] Office Fax:  [phone]

## 2016-09-19 DIAGNOSIS — R339 Retention of urine, unspecified: Secondary | ICD-10-CM | POA: Insufficient documentation

## 2016-09-19 DIAGNOSIS — G629 Polyneuropathy, unspecified: Secondary | ICD-10-CM | POA: Insufficient documentation

## 2017-01-28 ENCOUNTER — Inpatient Hospital Stay
Admission: EM | Admit: 2017-01-28 | Discharge: 2017-02-02 | DRG: 640 | Disposition: A | Discharge status: Home Health | Payer: Medicare Other | Attending: Internal Medicine | Admitting: Internal Medicine

## 2017-01-28 ENCOUNTER — Other Ambulatory Visit: Payer: Self-pay

## 2017-01-28 ENCOUNTER — Encounter: Payer: Self-pay | Admitting: Emergency Medicine

## 2017-01-28 DIAGNOSIS — E860 Dehydration: Secondary | ICD-10-CM | POA: Diagnosis present

## 2017-01-28 DIAGNOSIS — E871 Hypo-osmolality and hyponatremia: Secondary | ICD-10-CM | POA: Diagnosis present

## 2017-01-28 DIAGNOSIS — Z90710 Acquired absence of both cervix and uterus: Secondary | ICD-10-CM | POA: Diagnosis not present

## 2017-01-28 DIAGNOSIS — E878 Other disorders of electrolyte and fluid balance, not elsewhere classified: Secondary | ICD-10-CM | POA: Diagnosis present

## 2017-01-28 DIAGNOSIS — Z9049 Acquired absence of other specified parts of digestive tract: Secondary | ICD-10-CM

## 2017-01-28 DIAGNOSIS — E43 Unspecified severe protein-calorie malnutrition: Secondary | ICD-10-CM

## 2017-01-28 DIAGNOSIS — Z9011 Acquired absence of right breast and nipple: Secondary | ICD-10-CM

## 2017-01-28 DIAGNOSIS — Z8249 Family history of ischemic heart disease and other diseases of the circulatory system: Secondary | ICD-10-CM

## 2017-01-28 DIAGNOSIS — Z807 Family history of other malignant neoplasms of lymphoid, hematopoietic and related tissues: Secondary | ICD-10-CM | POA: Diagnosis not present

## 2017-01-28 DIAGNOSIS — Z86010 Personal history of colonic polyps: Secondary | ICD-10-CM | POA: Diagnosis not present

## 2017-01-28 DIAGNOSIS — Z823 Family history of stroke: Secondary | ICD-10-CM | POA: Diagnosis not present

## 2017-01-28 DIAGNOSIS — M810 Age-related osteoporosis without current pathological fracture: Secondary | ICD-10-CM | POA: Diagnosis present

## 2017-01-28 DIAGNOSIS — Z66 Do not resuscitate: Secondary | ICD-10-CM | POA: Diagnosis present

## 2017-01-28 DIAGNOSIS — Z79899 Other long term (current) drug therapy: Secondary | ICD-10-CM | POA: Diagnosis not present

## 2017-01-28 DIAGNOSIS — Z800 Family history of malignant neoplasm of digestive organs: Secondary | ICD-10-CM

## 2017-01-28 DIAGNOSIS — I10 Essential (primary) hypertension: Secondary | ICD-10-CM | POA: Diagnosis present

## 2017-01-28 DIAGNOSIS — R55 Syncope and collapse: Secondary | ICD-10-CM | POA: Diagnosis present

## 2017-01-28 DIAGNOSIS — Z8711 Personal history of peptic ulcer disease: Secondary | ICD-10-CM | POA: Diagnosis not present

## 2017-01-28 DIAGNOSIS — Z9849 Cataract extraction status, unspecified eye: Secondary | ICD-10-CM

## 2017-01-28 DIAGNOSIS — E876 Hypokalemia: Secondary | ICD-10-CM | POA: Diagnosis present

## 2017-01-28 DIAGNOSIS — K219 Gastro-esophageal reflux disease without esophagitis: Secondary | ICD-10-CM | POA: Diagnosis present

## 2017-01-28 DIAGNOSIS — R059 Cough, unspecified: Secondary | ICD-10-CM

## 2017-01-28 DIAGNOSIS — Z803 Family history of malignant neoplasm of breast: Secondary | ICD-10-CM | POA: Diagnosis not present

## 2017-01-28 DIAGNOSIS — F419 Anxiety disorder, unspecified: Secondary | ICD-10-CM | POA: Diagnosis present

## 2017-01-28 DIAGNOSIS — T502X5A Adverse effect of carbonic-anhydrase inhibitors, benzothiadiazides and other diuretics, initial encounter: Secondary | ICD-10-CM | POA: Diagnosis present

## 2017-01-28 DIAGNOSIS — Z853 Personal history of malignant neoplasm of breast: Secondary | ICD-10-CM

## 2017-01-28 DIAGNOSIS — R05 Cough: Secondary | ICD-10-CM

## 2017-01-28 DIAGNOSIS — I341 Nonrheumatic mitral (valve) prolapse: Secondary | ICD-10-CM | POA: Diagnosis present

## 2017-01-28 LAB — BASIC METABOLIC PANEL
ANION GAP: 7 (ref 5–15)
Anion gap: 8 (ref 5–15)
BUN: 12 mg/dL (ref 6–20)
BUN: 11 mg/dL (ref 6–20)
CALCIUM: 8.6 mg/dL — AB (ref 8.9–10.3)
CALCIUM: 8.5 mg/dL — AB (ref 8.9–10.3)
CO2: 26 mmol/L (ref 22–32)
CO2: 27 mmol/L (ref 22–32)
CREATININE: 0.4 mg/dL — AB (ref 0.44–1.00)
CREATININE: 0.35 mg/dL — AB (ref 0.44–1.00)
Chloride: 84 mmol/L — ABNORMAL LOW (ref 101–111)
Chloride: 85 mmol/L — ABNORMAL LOW (ref 101–111)
GFR calc Af Amer: >60 mL/min (ref >60)
GFR calc Af Amer: >60 mL/min (ref >60)
GLUCOSE: 109 mg/dL — AB (ref 65–99)
GLUCOSE: 121 mg/dL — AB (ref 65–99)
Potassium: 3.6 mmol/L (ref 3.5–5.1)
Potassium: 3.4 mmol/L — ABNORMAL LOW (ref 3.5–5.1)
Sodium: 118 mmol/L — CL (ref 135–145)
Sodium: 119 mmol/L — CL (ref 135–145)

## 2017-01-28 LAB — CBC
HCT: 37.3 % (ref 35.0–47.0)
HCT: 35.8 % (ref 35.0–47.0)
Hemoglobin: 13.1 g/dL (ref 12.0–16.0)
Hemoglobin: 12.4 g/dL (ref 12.0–16.0)
MCH: 32.7 pg (ref 26.0–34.0)
MCH: 32.7 pg (ref 26.0–34.0)
MCHC: 35 g/dL (ref 32.0–36.0)
MCHC: 34.7 g/dL (ref 32.0–36.0)
MCV: 93.4 fL (ref 80.0–100.0)
MCV: 94.2 fL (ref 80.0–100.0)
PLATELETS: 221 10*3/uL (ref 150–440)
Platelets: 239 10*3/uL (ref 150–440)
RBC: 4 MIL/uL (ref 3.80–5.20)
RBC: 3.8 MIL/uL (ref 3.80–5.20)
RDW: 13 % (ref 11.5–14.5)
RDW: 12.9 % (ref 11.5–14.5)
WBC: 5.1 10*3/uL (ref 3.6–11.0)
WBC: 6.1 10*3/uL (ref 3.6–11.0)

## 2017-01-28 LAB — URINALYSIS, COMPLETE (UACMP) WITH MICROSCOPIC
Bacteria, UA: NONE SEEN
Bilirubin Urine: NEGATIVE
Glucose, UA: NEGATIVE mg/dL
Hgb urine dipstick: NEGATIVE
KETONES UR: NEGATIVE mg/dL
Leukocytes, UA: NEGATIVE
NITRITE: NEGATIVE
PH: 8 (ref 5.0–8.0)
Protein, ur: NEGATIVE mg/dL
Specific Gravity, Urine: 1.01 (ref 1.005–1.030)
Squamous Epithelial / LPF: NONE SEEN

## 2017-01-28 LAB — GLUCOSE, CAPILLARY: GLUCOSE-CAPILLARY: 102 mg/dL — AB (ref 65–99)

## 2017-01-28 LAB — SODIUM, URINE, RANDOM: Sodium, Ur: 100 mmol/L

## 2017-01-28 LAB — OSMOLALITY, URINE: Osmolality, Ur: 433 mOsm/kg (ref 300–900)

## 2017-01-28 MED ORDER — ONDANSETRON HCL 4 MG PO TABS: 4.0000 mg | ORAL_TABLET | Freq: Four times a day (QID) | ORAL | Status: DC | PRN

## 2017-01-28 MED ORDER — METOPROLOL SUCCINATE ER 25 MG PO TB24
25.0000 mg | ORAL_TABLET | Freq: Every day | ORAL | Status: DC
  Administered 2017-01-28 – 2017-02-01 (×4): 25 mg via ORAL
  Filled 2017-01-28 (×5): qty 1

## 2017-01-28 MED ORDER — CALCIUM CITRATE-VITAMIN D 315-200 MG-UNIT PO TABS: 2.0000 | ORAL_TABLET | Freq: Two times a day (BID) | ORAL | Status: DC

## 2017-01-28 MED ORDER — ACETAMINOPHEN 325 MG PO TABS
650.0000 mg | ORAL_TABLET | Freq: Four times a day (QID) | ORAL | Status: DC | PRN
  Administered 2017-01-29 – 2017-01-31 (×5): 650 mg via ORAL
  Filled 2017-01-28 (×5): qty 2

## 2017-01-28 MED ORDER — POLYETHYLENE GLYCOL 3350 17 G PO PACK: 17.0000 g | PACK | Freq: Every day | ORAL | Status: DC | PRN

## 2017-01-28 MED ORDER — CALCIUM CITRATE-VITAMIN D 500-500 MG-UNIT PO CHEW
1.0000 | CHEWABLE_TABLET | Freq: Two times a day (BID) | ORAL | Status: DC
  Administered 2017-01-28 – 2017-02-02 (×10): 1 via ORAL
  Filled 2017-01-28 (×11): qty 1

## 2017-01-28 MED ORDER — ONDANSETRON HCL 4 MG/2ML IJ SOLN: 4.0000 mg | Freq: Four times a day (QID) | INTRAMUSCULAR | Status: DC | PRN

## 2017-01-28 MED ORDER — METOPROLOL SUCCINATE ER 25 MG PO TB24
25.0000 mg | ORAL_TABLET | Freq: Every day | ORAL | Status: DC
  Administered 2017-01-29 – 2017-02-02 (×5): 25 mg via ORAL
  Filled 2017-01-28 (×5): qty 1

## 2017-01-28 MED ORDER — PANTOPRAZOLE SODIUM 40 MG PO TBEC
40.0000 mg | DELAYED_RELEASE_TABLET | Freq: Every day | ORAL | Status: DC
  Administered 2017-01-29 – 2017-02-02 (×5): 40 mg via ORAL
  Filled 2017-01-28 (×5): qty 1

## 2017-01-28 MED ORDER — MAGNESIUM OXIDE 400 (241.3 MG) MG PO TABS
400.0000 mg | ORAL_TABLET | Freq: Two times a day (BID) | ORAL | Status: DC
  Administered 2017-01-28 – 2017-02-02 (×10): 400 mg via ORAL
  Filled 2017-01-28 (×10): qty 1

## 2017-01-28 MED ORDER — ENOXAPARIN SODIUM 40 MG/0.4ML ~~LOC~~ SOLN
40.0000 mg | SUBCUTANEOUS | Status: DC
  Administered 2017-01-28 – 2017-02-01 (×5): 40 mg via SUBCUTANEOUS
  Filled 2017-01-28 (×5): qty 0.4

## 2017-01-28 MED ORDER — METOPROLOL SUCCINATE ER 50 MG PO TB24: 50.0000 mg | ORAL_TABLET | ORAL | Status: DC

## 2017-01-28 MED ORDER — METOPROLOL SUCCINATE ER 50 MG PO TB24
50.0000 mg | ORAL_TABLET | Freq: Every day | ORAL | Status: DC
  Administered 2017-01-30 – 2017-02-02 (×4): 50 mg via ORAL
  Filled 2017-01-28 (×5): qty 1

## 2017-01-28 MED ORDER — ENSURE ENLIVE PO LIQD
1.0000 | Freq: Two times a day (BID) | ORAL | Status: DC
  Administered 2017-01-28 – 2017-01-30 (×4): 237 mL via ORAL

## 2017-01-28 MED ORDER — VITAMIN D3 25 MCG (1000 UNIT) PO TABS
2000.0000 [IU] | ORAL_TABLET | Freq: Every day | ORAL | Status: DC
  Administered 2017-01-29 – 2017-02-02 (×5): 2000 [IU] via ORAL
  Filled 2017-01-28 (×9): qty 2

## 2017-01-28 MED ORDER — ADULT MULTIVITAMIN W/MINERALS CH
1.0000 | ORAL_TABLET | Freq: Every day | ORAL | Status: DC
  Administered 2017-01-29 – 2017-02-02 (×5): 1 via ORAL
  Filled 2017-01-28 (×5): qty 1

## 2017-01-28 MED ORDER — PRO-STAT SUGAR FREE PO LIQD
30.0000 mL | Freq: Two times a day (BID) | ORAL | Status: DC
  Administered 2017-01-28 – 2017-01-29 (×2): 30 mL via ORAL

## 2017-01-28 MED ORDER — ACETAMINOPHEN 650 MG RE SUPP: 650.0000 mg | Freq: Four times a day (QID) | RECTAL | Status: DC | PRN

## 2017-01-28 MED ORDER — ALPRAZOLAM 0.25 MG PO TABS
0.1250 mg | ORAL_TABLET | Freq: Four times a day (QID) | ORAL | Status: DC
  Administered 2017-01-28 – 2017-02-02 (×19): 0.125 mg via ORAL
  Filled 2017-01-28 (×19): qty 1

## 2017-01-28 MED ORDER — COENZYME Q10 30 MG PO CAPS: 30.0000 mg | ORAL_CAPSULE | Freq: Every day | ORAL | Status: DC

## 2017-01-28 MED ORDER — SODIUM CHLORIDE 0.9 % IV SOLN
INTRAVENOUS | Status: DC
  Administered 2017-01-28 – 2017-01-31 (×5): via INTRAVENOUS

## 2017-01-28 MED ORDER — METOPROLOL SUCCINATE ER 25 MG PO TB24
25.0000 mg | ORAL_TABLET | Freq: Every day | ORAL | Status: DC
  Administered 2017-01-28 – 2017-02-01 (×5): 25 mg via ORAL
  Filled 2017-01-28 (×5): qty 1

## 2017-01-28 MED ORDER — SODIUM CHLORIDE 0.9 % IV SOLN
Freq: Once | INTRAVENOUS | Status: AC
  Administered 2017-01-28: 15:00:00 via INTRAVENOUS

## 2017-01-28 MED ORDER — METOCLOPRAMIDE HCL 5 MG PO TABS
5.0000 mg | ORAL_TABLET | Freq: Every day | ORAL | Status: DC
  Administered 2017-01-28 – 2017-02-01 (×5): 5 mg via ORAL
  Filled 2017-01-28 (×5): qty 1

## 2017-01-28 NOTE — ED Triage Notes (Signed)
Pt had syncopal episode this morning in beauty salon, remembers events prior to episode and after episode. Reports some sinus congestion since last night, took some Robitussin last night and this morning. After arriving to ED via [facility], pt started to feel faint again and vomited x1 in lobby. Pt reports hx of mitral valve prolapse and arrhythmia.

## 2017-01-28 NOTE — Progress Notes (Signed)
Family Meeting Note  Advance Directive yes  Today a meeting took place with the patient and husband   The following were discussed:Patient's diagnosis: , Patient's progosis: Patient is being admitted with severe hyponatremia and dehydration.  Prognosis is stable.  Discussed CODE STATUS.  Patient has a living will and she requests DNR.  Time spent during discussion:16 mins   Fritzi Mandes, MD

## 2017-01-28 NOTE — Progress Notes (Signed)
PHARMACIST - PHYSICIAN ORDER COMMUNICATION  CONCERNING: P&T Medication Policy on Herbal Medications  DESCRIPTION:  This patient's order for:  Co Enzyme Q10  has been noted.  This product(s) is classified as an "herbal" or natural product. Due to a lack of definitive safety studies or FDA approval, nonstandard manufacturing practices, plus the potential risk of unknown drug-drug interactions while on inpatient medications, the Pharmacy and Therapeutics Committee does not permit the use of "herbal" or natural products of this type within [place].   ACTION TAKEN: The pharmacy department is unable to verify this order at this time Please reevaluate patient's clinical condition at discharge and address if the herbal or natural product(s) should be resumed at that time.   

## 2017-01-28 NOTE — H&P (Signed)
[facility] [place] NAME: Katie Harding    MR#:  [id number]  DATE OF BIRTH:  [birth year]  DATE OF ADMISSION:  01/28/2017  PRIMARY CARE PHYSICIAN: Rusty Aus, MD   REQUESTING/REFERRING PHYSICIAN: dr Quentin Cornwall  CHIEF COMPLAINT:  I passed out at the beauty salon  HISTORY OF PRESENT ILLNESS:  Katie Harding  is a 76 y.o. female with a known history of anxiety, history of breast cancer, GERD comes to the emergency room after she had a syncopal episode at a beauty salon. Patient states she finished her blowdry treatment and started feeling nauseated and very dry in her mouth.  She took a sip of water and then slumped over the chair and EMS was called at the beauty salon.  Patient's vitals were B hemodialysis stable when EMS checked on her.  She was brought to the emergency room and was found to have sodium of 118 and chloride of 84.  Patient appears to be dehydrated and is being admitted for further evaluation and management.  She denies any chest pain seizures urinary incontinence or tongue bite. She denies any head injury.  Denies any episodes of fall or any recent illness.  Currently patient is receiving IV fluids  PAST MEDICAL HISTORY:   Past Medical History:  Diagnosis Date  . #[id number] 2006  . Anxiety   . Breast cancer [facility]) 2006   right breast lumpectomy with radiation  . Colon polyp    adenoma  . GERD (gastroesophageal reflux disease) 04/04/2015  . Heart disease   . History of gastric ulcer 04/2001  . History of right breast cancer    post surgery/ XRT  . MVP (mitral valve prolapse)    dental prophylaxis  . Osteoporosis   . Osteoporosis, post-menopausal   . Surgical menopause     PAST SURGICAL HISTOIRY:   Past Surgical History:  Procedure Laterality Date  . BREAST EXCISIONAL BIOPSY Right 2006   breast ca  . CATARACT EXTRACTION    . CHOLECYSTECTOMY    . COLONOSCOPY  12/15/1999   Tubulovillous Adenoma [facility]  (Brother)  . COLONOSCOPY  11/29/2004   PH Adenomatous Polyps, [facility] (Brother)  . COLONOSCOPY  03/14/2010   PH Adenomatous Polyps, [facility] (Brother); CBF 03/2015, Ltr mailed 01/19/2015 (dw)  . COLONOSCOPY  05/05/2015   PH Adenomatous Polyps, [facility] (Brother); No repeat due to age per RTE (dw)  . COLONOSCOPY WITH PROPOFOL N/A 05/05/2015   Procedure: COLONOSCOPY WITH PROPOFOL;  Surgeon: Manya Silvas, MD;  Location: [facility] ENDOSCOPY;  Service: Endoscopy;  Laterality: N/A;  . ESOPHAGOGASTRODUODENOSCOPY  12/24/2006   01/06/2004, 08/27/2001, 06/18/2001  . ESOPHAGOGASTRODUODENOSCOPY (EGD) WITH PROPOFOL N/A 05/05/2015   Procedure: ESOPHAGOGASTRODUODENOSCOPY (EGD) WITH PROPOFOL;  Surgeon: Manya Silvas, MD;  Location: [facility] ENDOSCOPY;  Service: Endoscopy;  Laterality: N/A;  . EYE SURGERY     cataracts removed  . MASTECTOMY PARTIAL / LUMPECTOMY    . NASAL SEPTUM SURGERY    . NASAL SEPTUM SURGERY    . ovarian cyst removed    . ROTATOR CUFF REPAIR Right   . TONSILLECTOMY    . TONSILLECTOMY    . TOTAL ABDOMINAL HYSTERECTOMY W/ BILATERAL SALPINGOOPHORECTOMY  1980s   for endometriosis in 1980s    SOCIAL HISTORY:   Social History   Tobacco Use  . Smoking status: Never Smoker  . Smokeless tobacco: Never Used  Substance Use Topics  . Alcohol use: No    FAMILY HISTORY:   Family History  Problem Relation Age of Onset  . Breast cancer Sister 14  . Multiple myeloma Sister   . Breast cancer Maternal Aunt 60  . Breast cancer Maternal Aunt 60  . Colon cancer Brother   . Intracerebral hemorrhage Mother   . CVA Mother   . Hypertension Mother   . Stroke Mother   . Basal cell carcinoma Mother   . Melanoma Neg Hx     DRUG ALLERGIES:  No Known Allergies  REVIEW OF SYSTEMS:  Review of Systems  Constitutional: Negative for chills, fever and weight loss.  HENT: Negative for ear discharge, ear pain and nosebleeds.   Eyes: Negative for blurred vision, pain and discharge.  Respiratory: Negative for  sputum production, shortness of breath, wheezing and stridor.   Cardiovascular: Negative for chest pain, palpitations, orthopnea and PND.  Gastrointestinal: Negative for abdominal pain, diarrhea, nausea and vomiting.  Genitourinary: Negative for frequency and urgency.  Musculoskeletal: Negative for back pain and joint pain.  Neurological: Positive for dizziness and weakness. Negative for sensory change, speech change and focal weakness.  Psychiatric/Behavioral: Negative for depression and hallucinations. The patient is not nervous/anxious.      MEDICATIONS AT HOME:   Prior to Admission medications   Medication Sig Start Date End Date Taking? Authorizing Provider  acetaminophen (TYLENOL) 325 MG tablet Take 650 mg by mouth every 4 (four) hours as needed. For pain / increased temp Max dose for 24 hrs is 3000 mg from all sources of Apap/tyenol   Yes [provider]  ALPRAZolam (XANAX) 0.25 MG tablet Take 0.5 tablets (0.125 mg total) by mouth 4 (four) times daily. 08/24/16  Yes Toni Arthurs, NP  Amino Acids-Protein Hydrolys (FEEDING SUPPLEMENT, PRO-STAT SUGAR FREE 64,) LIQD Take 30 mLs by mouth 2 (two) times daily between meals.   Yes [provider]  calcium citrate-vitamin D (CITRACAL+D) 315-200 MG-UNIT tablet Take 2 tablets by mouth 2 (two) times daily.    Yes [provider]  Cholecalciferol 1000 units capsule Take 2,000 Units by mouth daily. 2 caps   Yes [provider]  co-enzyme Q-10 30 MG capsule Take 30 mg by mouth daily.   Yes [provider]  docusate sodium (COLACE) 100 MG capsule Take 100 mg by mouth 2 (two) times daily.   Yes [provider]  magnesium oxide (MAG-OX) 400 MG tablet Take 400 mg by mouth 2 (two) times daily.   Yes [provider]  metoCLOPramide (REGLAN) 5 MG tablet Take 5 mg by mouth at bedtime.    Yes [provider]  metoprolol succinate (TOPROL XL) 50 MG 24 hr tablet Take 50 mg by mouth See admin  instructions. Take 1/2 tablet (25 mg) q a.m., 1 whole tablet (50 mg)  at lunch, 1/2 tablet (11m)  with dinner, and 1/2 tablet (25 mg) at bedtime   Yes [provider]  Multiple Vitamin (MULTIVITAMIN) tablet Take 1 tablet by mouth daily.   Yes [provider]  Nutritional Supplements (ENSURE ENLIVE PO) Take 1 Bottle by mouth 2 (two) times daily between meals.   Yes [provider]  omeprazole (PRILOSEC) 40 MG capsule Take 40 mg by mouth 2 (two) times daily.   Yes [provider]  polyethylene glycol (MIRALAX / GLYCOLAX) packet Take 17 g by mouth daily.    Yes [provider]  senna (SENOKOT) 8.6 MG tablet Take 2 tablets by mouth 2 (two) times daily.    Yes [provider]      VITAL  SIGNS:  Blood pressure 128/62, pulse 66, temperature 98.1 F (36.7 C), temperature source Oral, height 5' 7"  (1.702 m), weight 47.2 kg (104 lb), SpO2 97 %.  PHYSICAL EXAMINATION:  GENERAL:  75 y.o.-year-old patient lying in the bed with no acute distress.  EYES: Pupils equal, round, reactive to light and accommodation. No scleral icterus. Extraocular muscles intact.  HEENT: Head atraumatic, normocephalic.  Dry oral mucosa NECK:  Supple, no jugular venous distention. No thyroid enlargement, no tenderness.  LUNGS: Normal breath sounds bilaterally, no wheezing, rales,rhonchi or crepitation. No use of accessory muscles of respiration.  CARDIOVASCULAR: S1, S2 normal. No murmurs, rubs, or gallops.  ABDOMEN: Soft, nontender, nondistended. Bowel sounds present. No organomegaly or mass.  EXTREMITIES: No pedal edema, cyanosis, or clubbing.  NEUROLOGIC: Cranial nerves II through XII are intact. Muscle strength 5/5 in all extremities. Sensation intact. Gait not checked.  PSYCHIATRIC: The patient is alert and oriented x 3.  SKIN: No obvious rash, lesion, or ulcer.   LABORATORY PANEL:   CBC Recent Labs  Lab 01/28/17 1136  WBC 5.1  HGB 13.1  HCT 37.3  PLT 239    ------------------------------------------------------------------------------------------------------------------  Chemistries  Recent Labs  Lab 01/28/17 1136  NA 118*  K 3.6  CL 84*  CO2 26  GLUCOSE 109*  BUN 12  CREATININE 0.40*  CALCIUM 8.6*   ------------------------------------------------------------------------------------------------------------------  Cardiac Enzymes No results for input(s): TROPONINI in the last 168 hours. ------------------------------------------------------------------------------------------------------------------  RADIOLOGY:  No results found.  EKG:    IMPRESSION AND PLAN:   Acsa Estey  is a 76 y.o. female with a known history of anxiety, history of breast cancer, GERD comes to the emergency room after she had a syncopal episode at a beauty salon. Patient states she finished her blowdry treatment and started feeling nauseated and very dry in her mouth.  She took a sip of water and then slumped over the chair and EMS was called at the beauty salon.    1.  Acute hyponatremia suspected due to dehydration and poor fluid intake -Patient also has hypochloremia -Start IV fluids normal saline at 75 an hour -Monitor sodium -Check urine osmolality and urine sodium -Consider nephrology consultation if needed -Recent TSH was 2.8  2.  GERD continue PPI  3.  Chronic anxiety continue Xanax as needed  DVT prophylaxis subcu Lovenox  Above was discussed with patient patient's family    All the records are reviewed and case discussed with ED provider. Management plans discussed with the patient, family and they are in agreement.  CODE STATUS: DNR  TOTAL TIME TAKING CARE OF THIS PATIENT: *45* minutes.    Fritzi Mandes M.D on 01/28/2017 at 2:27 PM  Between 7am to 6pm - Pager - [phone]  After 6pm go to [email or web address] - password [email or web address] [facility]  [facility]  Office  [phone]  CC: Primary care physician; Rusty Aus,  MD

## 2017-01-28 NOTE — ED Provider Notes (Signed)
[facility] Emergency Department Provider Note    First MD Initiated Contact with Patient 01/28/17 1201     (approximate)  I have reviewed the triage vital signs and the nursing notes.   HISTORY  Chief Complaint Loss of Consciousness    HPI Katie Harding is a 76 y.o. female with a history of hypertension as well as GERD and reported anxiety who presents after syncopal episode while the patient was at the beauty salon today.  Patient states that she has been having nasal congestion is been taking over-the-counter medications.  States that she started feeling woozy and weak while sitting in the chair.  She asked for some water and while taking a small sip of water.  She states that there is a choking episode in the staff states that her head went back and she lost consciousness for a brief period but patient was then brought to the hospital and does endorse nausea with one episode of vomiting.  Denies any chest pains.  Denies any palpitations.  No numbness or tingling.  No headache.  Past Medical History:  Diagnosis Date  . #[id number] 2006  . Anxiety   . Breast cancer [facility]) 2006   right breast lumpectomy with radiation  . Colon polyp    adenoma  . GERD (gastroesophageal reflux disease) 04/04/2015  . Heart disease   . History of gastric ulcer 04/2001  . History of right breast cancer    post surgery/ XRT  . MVP (mitral valve prolapse)    dental prophylaxis  . Osteoporosis   . Osteoporosis, post-menopausal   . Surgical menopause    Family History  Problem Relation Age of Onset  . Breast cancer Sister 24  . Multiple myeloma Sister   . Breast cancer Maternal Aunt 60  . Breast cancer Maternal Aunt 60  . Colon cancer Brother   . Intracerebral hemorrhage Mother   . CVA Mother   . Hypertension Mother   . Stroke Mother   . Basal cell carcinoma Mother   . Melanoma Neg Hx    Past Surgical History:  Procedure Laterality Date  . BREAST EXCISIONAL BIOPSY  Right 2006   breast ca  . CATARACT EXTRACTION    . CHOLECYSTECTOMY    . 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 COLONOSCOPY  11/29/2004   PH Adenomatous Polyps, [facility] (Brother)  . COLONOSCOPY  03/14/2010   PH Adenomatous Polyps, [facility] (Brother); CBF 03/2015, Ltr mailed 01/19/2015 (dw)  . COLONOSCOPY  05/05/2015   PH Adenomatous Polyps, [facility] (Brother); No repeat due to age per RTE (dw)  . COLONOSCOPY WITH PROPOFOL N/A 05/05/2015   Procedure: COLONOSCOPY WITH PROPOFOL;  Surgeon: Manya Silvas, MD;  Location[facility] ENDOSCOPY;  Service: Endoscopy;  Laterality: N/A;  . ESOPHAGOGASTRODUODENOSCOPY  12/24/2006   01/06/2004, 08/27/2001, 06/18/2001  . ESOPHAGOGASTRODUODENOSCOPY (EGD) WITH PROPOFOL N/A 05/05/2015   Procedure: ESOPHAGOGASTRODUODENOSCOPY (EGD) WITH PROPOFOL;  Surgeon: Manya Silvas, MD;  Location: [facility] ENDOSCOPY;  Service: Endoscopy;  Laterality: N/A;  . EYE SURGERY     cataracts removed  . MASTECTOMY PARTIAL / LUMPECTOMY    . NASAL SEPTUM SURGERY    . NASAL SEPTUM SURGERY    . ovarian cyst removed    . ROTATOR CUFF REPAIR Right   . TONSILLECTOMY    . TONSILLECTOMY    . TOTAL ABDOMINAL HYSTERECTOMY W/ BILATERAL SALPINGOOPHORECTOMY  1980s   for endometriosis in 1980s   Patient Active Problem List   Diagnosis  Date Noted  . Hyponatremia 01/28/2017  . Neuropathy 09/19/2016  . Urinary retention 09/19/2016  . Closed fracture of multiple pubic rami ([place]) 08/20/2016      Prior to Admission medications   Medication Sig Start Date End Date Taking? Authorizing Provider  acetaminophen (TYLENOL) 325 MG tablet Take 650 mg by mouth every 4 (four) hours as needed. For pain / increased temp Max dose for 24 hrs is 3000 mg from all sources of Apap/tyenol   Yes [provider]  ALPRAZolam (XANAX) 0.25 MG tablet Take 0.5 tablets (0.125 mg total) by mouth 4 (four) times daily. 08/24/16  Yes Toni Arthurs, NP  Amino Acids-Protein Hydrolys (FEEDING SUPPLEMENT,  PRO-STAT SUGAR FREE 64,) LIQD Take 30 mLs by mouth 2 (two) times daily between meals.   Yes [provider]  calcium citrate-vitamin D (CITRACAL+D) 315-200 MG-UNIT tablet Take 2 tablets by mouth 2 (two) times daily.    Yes [provider]  Cholecalciferol 1000 units capsule Take 2,000 Units by mouth daily. 2 caps   Yes [provider]  co-enzyme Q-10 30 MG capsule Take 30 mg by mouth daily.   Yes [provider]  docusate sodium (COLACE) 100 MG capsule Take 100 mg by mouth 2 (two) times daily.   Yes [provider]  magnesium oxide (MAG-OX) 400 MG tablet Take 400 mg by mouth 2 (two) times daily.   Yes [provider]  metoCLOPramide (REGLAN) 5 MG tablet Take 5 mg by mouth at bedtime.    Yes [provider]  metoprolol succinate (TOPROL XL) 50 MG 24 hr tablet Take 50 mg by mouth See admin instructions. Take 1/2 tablet (25 mg) q a.m., 1 whole tablet (50 mg)  at lunch, 1/2 tablet ('25mg'$ )  with dinner, and 1/2 tablet (25 mg) at bedtime   Yes [provider]  Multiple Vitamin (MULTIVITAMIN) tablet Take 1 tablet by mouth daily.   Yes [provider]  Nutritional Supplements (ENSURE ENLIVE PO) Take 1 Bottle by mouth 2 (two) times daily between meals.   Yes [provider]  omeprazole (PRILOSEC) 40 MG capsule Take 40 mg by mouth 2 (two) times daily.   Yes [provider]  polyethylene glycol (MIRALAX / GLYCOLAX) packet Take 17 g by mouth daily.    Yes [provider]  senna (SENOKOT) 8.6 MG tablet Take 2 tablets by mouth 2 (two) times daily.    Yes [provider]    Allergies Patient has no known allergies.    Social History Social History   Tobacco Use  . Smoking status: Never Smoker  . Smokeless tobacco: Never Used  Substance Use Topics  . Alcohol use: No  . Drug use: No    Review of Systems Patient denies headaches, rhinorrhea, blurry vision, numbness, shortness of breath,  chest pain, edema, cough, abdominal pain, nausea, vomiting, diarrhea, dysuria, fevers, rashes or hallucinations unless otherwise stated above in HPI. ____________________________________________   PHYSICAL EXAM:  VITAL SIGNS: Vitals:   01/28/17 1436 01/28/17 1501  BP: 118/66 121/67  Pulse: 73 73  Resp: 18   Temp:  97.8 F (36.6 C)  SpO2: 99% 100%    Constitutional: Alert and oriented. Well appearing and in no acute distress. Eyes: Conjunctivae are normal.  Head: Atraumatic. Nose: No congestion/rhinnorhea. Mouth/Throat: Mucous membranes are moist.   Neck: No stridor. Painless ROM.  Cardiovascular: Normal rate, regular rhythm. Grossly normal heart sounds.  Good peripheral circulation. Respiratory: Normal respiratory effort.  No retractions. Lungs CTAB. Gastrointestinal:  Soft and nontender. No distention. No abdominal bruits. No CVA tenderness. Genitourinary:  Musculoskeletal: No lower extremity tenderness nor edema.  No joint effusions. Neurologic:  Normal speech and language. No gross focal neurologic deficits are appreciated. No facial droop Skin:  Skin is warm, dry and intact. No rash noted. Psychiatric: Mood and affect are normal. Speech and behavior are normal.  ____________________________________________   LABS (all labs ordered are listed, but only abnormal results are displayed)  Results for orders placed or performed during the hospital encounter of 01/28/17 (from the past 24 hour(s))  Basic metabolic panel     Status: Abnormal   Collection Time: 01/28/17 11:36 AM  Result Value Ref Range   Sodium 118 (LL) 135 - 145 mmol/L   Potassium 3.6 3.5 - 5.1 mmol/L   Chloride 84 (L) 101 - 111 mmol/L   CO2 26 22 - 32 mmol/L   Glucose, Bld 109 (H) 65 - 99 mg/dL   BUN 12 6 - 20 mg/dL   Creatinine, Ser 0.40 (L) 0.44 - 1.00 mg/dL   Calcium 8.6 (L) 8.9 - 10.3 mg/dL   GFR calc non Af Amer >60 >60 mL/min   GFR calc Af Amer >60 >60 mL/min   Anion gap 8 5 - 15  CBC     Status:  None   Collection Time: 01/28/17 11:36 AM  Result Value Ref Range   WBC 5.1 3.6 - 11.0 K/uL   RBC 4.00 3.80 - 5.20 MIL/uL   Hemoglobin 13.1 12.0 - 16.0 g/dL   HCT 37.3 35.0 - 47.0 %   MCV 93.4 80.0 - 100.0 fL   MCH 32.7 26.0 - 34.0 pg   MCHC 35.0 32.0 - 36.0 g/dL   RDW 13.0 11.5 - 14.5 %   Platelets 239 150 - 440 K/uL  Urinalysis, Complete w Microscopic     Status: Abnormal   Collection Time: 01/28/17 11:36 AM  Result Value Ref Range   Color, Urine YELLOW (A) YELLOW   APPearance HAZY (A) CLEAR   Specific Gravity, Urine 1.010 1.005 - 1.030   pH 8.0 5.0 - 8.0   Glucose, UA NEGATIVE NEGATIVE mg/dL   Hgb urine dipstick NEGATIVE NEGATIVE   Bilirubin Urine NEGATIVE NEGATIVE   Ketones, ur NEGATIVE NEGATIVE mg/dL   Protein, ur NEGATIVE NEGATIVE mg/dL   Nitrite NEGATIVE NEGATIVE   Leukocytes, UA NEGATIVE NEGATIVE   RBC / HPF 0-5 0 - 5 RBC/hpf   WBC, UA 0-5 0 - 5 WBC/hpf   Bacteria, UA NONE SEEN NONE SEEN   Squamous Epithelial / LPF NONE SEEN NONE SEEN   Mucus PRESENT    Hyaline Casts, UA PRESENT    Amorphous Crystal PRESENT   Glucose, capillary     Status: Abnormal   Collection Time: 01/28/17 11:46 AM  Result Value Ref Range   Glucose-Capillary 102 (H) 65 - 99 mg/dL   ____________________________________________  EKG My review and personal interpretation at Time: 11:40   Indication: syncope  Rate: 60  Rhythm: sinus Axis: normal Other: normal intervals, no stemi ____________________________________________  RADIOLOGY  I personally reviewed all radiographic images ordered to evaluate for the above acute complaints and reviewed radiology reports and findings.  These findings were personally discussed with the patient.  Please see medical record for radiology report.  ____________________________________________   PROCEDURES  Procedure(s) performed:  .Critical Care Performed by: Merlyn Lot, MD Authorized by: Merlyn Lot, MD   Critical care provider  statement:    Critical care time (minutes):  30  Critical care time was exclusive of:  Separately billable procedures and treating other patients   Critical care was necessary to treat or prevent imminent or life-threatening deterioration of the following conditions:  Metabolic crisis   Critical care was time spent personally by me on the following activities:  Development of treatment plan with patient or surrogate, discussions with consultants, evaluation of patient's response to treatment, examination of patient, obtaining history from patient or surrogate, ordering and performing treatments and interventions, ordering and review of laboratory studies, ordering and review of radiographic studies, pulse oximetry, re-evaluation of patient's condition and review of old charts      Critical Care performed: yes ____________________________________________   INITIAL IMPRESSION / ASSESSMENT AND PLAN / ED COURSE  Pertinent labs & imaging results that were available during my care of the patient were reviewed by me and considered in my medical decision making (see chart for details).  DDX: Dehydration, electrolyte abnormality, dysrhythmia, ACS, seizure, hypoglycemia,  QUINTERIA CHISUM is a 76 y.o. who presents to the ED with syncopal episode as described above.  Patient now well-appearing and in no acute distress.  No evidence of head injury or focal neuro deficit to indicate need for emergent head CT.  Blood work sent for the above differential does show evidence of acute hyponatremia given the patient's presentation I do believe that this is the etiology of her syncopal episode.  Will start on IV hydration.  No evidence of infectious process.  Patient will require admission the hospital for symptomatic hyponatremia and syncopal event.  Have discussed with the patient and available family all diagnostics and treatments performed thus far and all questions were answered to the best of my ability. The  patient demonstrates understanding and agreement with plan.       ____________________________________________   FINAL CLINICAL IMPRESSION(S) / ED DIAGNOSES  Final diagnoses:  Syncope and collapse  Hyponatremia      NEW MEDICATIONS STARTED DURING THIS VISIT:  This SmartLink is deprecated. Use AVSMEDLIST instead to display the medication list for a patient.   Note:  This document was prepared using Dragon voice recognition software and may include unintentional dictation errors.    Merlyn Lot, MD 01/28/17 [phone]

## 2017-01-28 NOTE — ED Notes (Addendum)
Attempted to call report. Floor states there is a patient in room 203.

## 2017-01-29 LAB — BASIC METABOLIC PANEL
Anion gap: 7 (ref 5–15)
Anion gap: 5 (ref 5–15)
BUN: 12 mg/dL (ref 6–20)
BUN: 11 mg/dL (ref 6–20)
CALCIUM: 8.4 mg/dL — AB (ref 8.9–10.3)
CALCIUM: 8.3 mg/dL — AB (ref 8.9–10.3)
CHLORIDE: 88 mmol/L — AB (ref 101–111)
CO2: 27 mmol/L (ref 22–32)
CO2: 26 mmol/L (ref 22–32)
CREATININE: 0.41 mg/dL — AB (ref 0.44–1.00)
Chloride: 85 mmol/L — ABNORMAL LOW (ref 101–111)
Creatinine, Ser: 0.37 mg/dL — ABNORMAL LOW (ref 0.44–1.00)
GFR calc Af Amer: >60 mL/min (ref >60)
GFR calc Af Amer: >60 mL/min (ref >60)
GFR calc non Af Amer: >60 mL/min (ref >60)
GLUCOSE: 107 mg/dL — AB (ref 65–99)
Glucose, Bld: 128 mg/dL — ABNORMAL HIGH (ref 65–99)
POTASSIUM: 2.7 mmol/L — AB (ref 3.5–5.1)
Potassium: 4.1 mmol/L (ref 3.5–5.1)
SODIUM: 119 mmol/L — AB (ref 135–145)
Sodium: 119 mmol/L — CL (ref 135–145)

## 2017-01-29 MED ORDER — POTASSIUM CHLORIDE CRYS ER 20 MEQ PO TBCR
40.0000 meq | EXTENDED_RELEASE_TABLET | Freq: Two times a day (BID) | ORAL | Status: AC
  Administered 2017-01-29 (×2): 40 meq via ORAL
  Filled 2017-01-29 (×2): qty 2

## 2017-01-29 MED ORDER — CEPASTAT 14.5 MG MT LOZG
1.0000 | LOZENGE | OROMUCOSAL | Status: DC | PRN
  Administered 2017-01-29 – 2017-02-01 (×6): 1 via BUCCAL
  Filled 2017-01-29 (×3): qty 9

## 2017-01-29 MED ORDER — SPIRONOLACTONE 25 MG PO TABS
25.0000 mg | ORAL_TABLET | Freq: Every day | ORAL | Status: DC
  Administered 2017-01-29 – 2017-01-31 (×3): 25 mg via ORAL
  Filled 2017-01-29 (×3): qty 1

## 2017-01-29 MED ORDER — LORATADINE 10 MG PO TABS
10.0000 mg | ORAL_TABLET | Freq: Every day | ORAL | Status: DC
  Administered 2017-01-29 – 2017-02-02 (×5): 10 mg via ORAL
  Filled 2017-01-29 (×6): qty 1

## 2017-01-29 MED ORDER — LORATADINE 10 MG PO TABS: 10.0000 mg | ORAL_TABLET | Freq: Every day | ORAL | Status: DC

## 2017-01-29 MED ORDER — SALINE SPRAY 0.65 % NA SOLN
1.0000 | NASAL | Status: DC | PRN
  Administered 2017-01-29 – 2017-01-31 (×2): 1 via NASAL
  Filled 2017-01-29: qty 44

## 2017-01-29 MED ORDER — POTASSIUM CHLORIDE 10 MEQ/100ML IV SOLN
10.0000 meq | INTRAVENOUS | Status: AC
  Administered 2017-01-29 (×4): 10 meq via INTRAVENOUS
  Filled 2017-01-29 (×4): qty 100

## 2017-01-29 NOTE — Progress Notes (Signed)
[place] [facility] [place] NAME: Katie Harding    MR#:  [id number]  DATE OF BIRTH:  11/02/1940  SUBJECTIVE:   Patient here with weakness  REVIEW OF SYSTEMS:    Review of Systems  Constitutional: Negative for fever, chills weight loss Generalized weakness HENT: Negative for ear pain, nosebleeds, congestion, facial swelling, rhinorrhea, neck pain, neck stiffness and ear discharge.   Respiratory: Negative for cough, shortness of breath, wheezing  Cardiovascular: Negative for chest pain, palpitations and leg swelling.  Gastrointestinal: Negative for heartburn, abdominal pain, vomiting, diarrhea or consitpation Genitourinary: Negative for dysuria, urgency, frequency, hematuria Musculoskeletal: Negative for back pain or joint pain Neurological: Negative for dizziness, seizures, syncope, focal weakness,  numbness and headaches.  Hematological: Does not bruise/bleed easily.  Psychiatric/Behavioral: Negative for hallucinations, confusion, dysphoric mood    Tolerating Diet:yes      DRUG ALLERGIES:  No Known Allergies  VITALS:  Blood pressure (!) 118/55, pulse 73, temperature 97.7 F (36.5 C), temperature source Oral, resp. rate 18, height 5\' 7"  (1.702 m), weight 46 kg (101 lb 6.4 oz), SpO2 95 %.  PHYSICAL EXAMINATION:  Constitutional: Appears well-developed and well-nourished. No distress. HENT: Normocephalic. Marland Kitchen Oropharynx is clear and moist.  Eyes: Conjunctivae and EOM are normal. PERRLA, no scleral icterus.  Neck: Normal ROM. Neck supple. No JVD. No tracheal deviation. CVS: RRR, S1/S2 +, no murmurs, no gallops, no carotid bruit.  Pulmonary: Effort and breath sounds normal, no stridor, rhonchi, wheezes, rales.  Abdominal: Soft. BS +,  no distension, tenderness, rebound or guarding.  Musculoskeletal: Normal range of motion. No edema and no tenderness.  Neuro: Alert. CN 2-12 grossly intact. No focal deficits. Skin: Skin is warm and dry. No  rash noted. Psychiatric: Normal mood and affect.      LABORATORY PANEL:   CBC Recent Labs  Lab 01/28/17 1544  WBC 6.1  HGB 12.4  HCT 35.8  PLT 221   ------------------------------------------------------------------------------------------------------------------  Chemistries  Recent Labs  Lab 01/29/17 0414  NA 119*  K 2.7*  CL 85*  CO2 27  GLUCOSE 128*  BUN 12  CREATININE 0.37*  CALCIUM 8.4*   ------------------------------------------------------------------------------------------------------------------  Cardiac Enzymes No results for input(s): TROPONINI in the last 168 hours. ------------------------------------------------------------------------------------------------------------------  RADIOLOGY:  No results found.   ASSESSMENT AND PLAN:    77 y.o. female with a known history of anxiety, history of breast cancer, GERD comes to the emergency room after she had a syncopal episode at a beauty salon.   1. Hyponatremia: Sodium level still low after IVF Nephrology consult requested Continue IVF and monitor sodium Recent TSH was 2.8  2.  GERD continue PPI  3.  Chronic anxiety continue Xanax as needed  4. Essential HTN: Continue  Metoprolol   5. Hypokalemia and hypoMg: replete and recheck in am  PT consult for d/c planning   Management plans discussed with the patient and she is in agreement.  CODE STATUS: DNR TOTAL TIME TAKING CARE OF THIS PATIENT: 30 minutes.     POSSIBLE D/C 1-2 days, DEPENDING ON CLINICAL CONDITION.   Lemoine Goyne M.D on 01/29/2017 at 9:10 AM  Between 7am to 6pm - Pager - [phone] After 6pm go to [email or web address] - password [email or web address] [facility]  Office  [phone]  CC: Primary care physician; Rusty Aus, MD  Note: This dictation was prepared with Dragon dictation along with smaller phrase technology. Any transcriptional errors that result from this process are unintentional.

## 2017-01-29 NOTE — Progress Notes (Signed)
PT Cancellation Note  Patient Details Name: Katie Harding MRN: [id number] DOB: [birth year]   Cancelled Treatment:    Reason Eval/Treat Not Completed: Patient not medically ready Pt's Na+ is staying consistently low at critical level of 119.  PT eval held at this time, will try back as pt is more appropriate.  Kreg Shropshire, DPT 01/29/2017, 4:12 PM

## 2017-01-29 NOTE — Consult Note (Signed)
Date: 01/29/2017                  Patient Name:  Katie Harding  MRN: [id number]  DOB: 1940-10-08  Age / Sex: 77 y.o., female         PCP: Rusty Aus, MD                 Service Requesting Consult: IM/ Fritzi Mandes, MD                 Reason for Consult:  Hyponatremia            History of Present Illness: Patient is a 77 y.o. female with medical problems of anxiety, mitral valve prolapse, osteoporosis, history of breast cancer, who was admitted to [facility] on 01/28/2017 for evaluation of syncopal episode patient reports that she was at the hairdresser's yesterday.  She felt hot and had multiple dry..  When she took a sip of water she passed out.  She only remembers drinking a sip of water.  She states she must have passed out for 3-4 minutes before EMS came.  She regained consciousness and was available EMS was called. Upon evaluation in the ED, she was found to have a low sodium of 119.  This morning, despite IV normal saline  supplementation, her sodium is stil the same at 119 but  potassium is lower at 2.7.  Serum creatinine is normal at 0.37.  Other workup includes urinalysis which was 1.010 . It is Negative for UTI.  Urine osmolality was 433, urine sodium 100 at time of admission.  Patient reports she was taking hydrochlorothiazide as outpatient.  It is not listed on home medications.  She was also taking MiraLAX on a daily basis.  Medications: Outpatient medications: Medications Prior to Admission  Medication Sig Dispense Refill Last Dose  . acetaminophen (TYLENOL) 325 MG tablet Take 650 mg by mouth every 4 (four) hours as needed. For pain / increased temp Max dose for 24 hrs is 3000 mg from all sources of Apap/tyenol   PRN at PRN  . ALPRAZolam (XANAX) 0.25 MG tablet Take 0.5 tablets (0.125 mg total) by mouth 4 (four) times daily. 60 tablet 1 01/28/2017 at 0800  . Amino Acids-Protein Hydrolys (FEEDING SUPPLEMENT, PRO-STAT SUGAR FREE 64,) LIQD Take 30 mLs by mouth 2 (two) times daily  between meals.   Taking  . calcium citrate-vitamin D (CITRACAL+D) 315-200 MG-UNIT tablet Take 2 tablets by mouth 2 (two) times daily.    01/28/2017 at 0800  . Cholecalciferol 1000 units capsule Take 2,000 Units by mouth daily. 2 caps   01/28/2017 at 0800  . co-enzyme Q-10 30 MG capsule Take 30 mg by mouth daily.   01/28/2017 at 0800  . docusate sodium (COLACE) 100 MG capsule Take 100 mg by mouth 2 (two) times daily.   01/28/2017 at 0800  . magnesium oxide (MAG-OX) 400 MG tablet Take 400 mg by mouth 2 (two) times daily.   01/28/2017 at 0800  . metoCLOPramide (REGLAN) 5 MG tablet Take 5 mg by mouth at bedtime.    01/27/2017 at 2000  . metoprolol succinate (TOPROL XL) 50 MG 24 hr tablet Take 50 mg by mouth See admin instructions. Take 1/2 tablet (25 mg) q a.m., 1 whole tablet (50 mg)  at lunch, 1/2 tablet (55m)  with dinner, and 1/2 tablet (25 mg) at bedtime   01/28/2017 at 0800  . Multiple Vitamin (MULTIVITAMIN) tablet Take 1 tablet by mouth daily.  01/28/2017 at 0800  . Nutritional Supplements (ENSURE ENLIVE PO) Take 1 Bottle by mouth 2 (two) times daily between meals.   Taking  . omeprazole (PRILOSEC) 40 MG capsule Take 40 mg by mouth 2 (two) times daily.   01/28/2017 at 0800  . polyethylene glycol (MIRALAX / GLYCOLAX) packet Take 17 g by mouth daily.    01/28/2017 at 0800  . senna (SENOKOT) 8.6 MG tablet Take 2 tablets by mouth 2 (two) times daily.    01/28/2017 at 0800    Current medications: Current Facility-Administered Medications  Medication Dose Route Frequency Provider Last Rate Last Dose  . 0.9 %  sodium chloride infusion   Intravenous Continuous Fritzi Mandes, MD 75 mL/hr at 01/29/17 0217    . acetaminophen (TYLENOL) tablet 650 mg  650 mg Oral Q6H PRN Fritzi Mandes, MD   650 mg at 01/29/17 0755   Or  . acetaminophen (TYLENOL) suppository 650 mg  650 mg Rectal Q6H PRN Fritzi Mandes, MD      . ALPRAZolam Duanne Moron) tablet 0.125 mg  0.125 mg Oral QID Fritzi Mandes, MD   0.125 mg at 01/29/17 0913   . calcium citrate-vitamin D 500-500 MG-UNIT per chewable tablet 1 tablet  1 tablet Oral BID Fritzi Mandes, MD   1 tablet at 01/29/17 0913  . cholecalciferol (VITAMIN D) tablet 2,000 Units  2,000 Units Oral Daily Fritzi Mandes, MD   2,000 Units at 01/29/17 0913  . enoxaparin (LOVENOX) injection 40 mg  40 mg Subcutaneous Q24H Fritzi Mandes, MD   40 mg at 01/28/17 2102  . feeding supplement (ENSURE ENLIVE) (ENSURE ENLIVE) liquid 237 mL  1 Bottle Oral BID BM Fritzi Mandes, MD   237 mL at 01/29/17 0928  . feeding supplement (PRO-STAT SUGAR FREE 64) liquid 30 mL  30 mL Oral BID BM Fritzi Mandes, MD   30 mL at 01/29/17 0928  . loratadine (CLARITIN) tablet 10 mg  10 mg Oral Daily Fritzi Mandes, MD   10 mg at 01/29/17 0204  . magnesium oxide (MAG-OX) tablet 400 mg  400 mg Oral BID Fritzi Mandes, MD   400 mg at 01/29/17 0912  . metoCLOPramide (REGLAN) tablet 5 mg  5 mg Oral QHS Fritzi Mandes, MD   5 mg at 01/28/17 2101  . metoprolol succinate (TOPROL-XL) 24 hr tablet 25 mg  25 mg Oral Q breakfast Fritzi Mandes, MD   25 mg at 01/29/17 0754  . metoprolol succinate (TOPROL-XL) 24 hr tablet 25 mg  25 mg Oral Q supper Fritzi Mandes, MD   25 mg at 01/28/17 1719  . metoprolol succinate (TOPROL-XL) 24 hr tablet 25 mg  25 mg Oral QHS Fritzi Mandes, MD   25 mg at 01/28/17 2101  . metoprolol succinate (TOPROL-XL) 24 hr tablet 50 mg  50 mg Oral Q lunch Fritzi Mandes, MD      . multivitamin with minerals tablet 1 tablet  1 tablet Oral Daily Fritzi Mandes, MD   1 tablet at 01/29/17 0912  . ondansetron (ZOFRAN) tablet 4 mg  4 mg Oral Q6H PRN Fritzi Mandes, MD       Or  . ondansetron [facility]) injection 4 mg  4 mg Intravenous Q6H PRN Fritzi Mandes, MD      . pantoprazole (PROTONIX) EC tablet 40 mg  40 mg Oral Daily Fritzi Mandes, MD   40 mg at 01/29/17 0913  . phenol-menthol (CEPASTAT) lozenge 1 lozenge  1 lozenge Buccal PRN Lance Coon, MD   1 lozenge at 01/29/17 [phone]  .  polyethylene glycol (MIRALAX / GLYCOLAX) packet 17 g  17 g Oral Daily PRN Fritzi Mandes, MD      . potassium chloride SA (K-DUR,KLOR-CON) CR tablet 40 mEq  40 mEq Oral BID Harrie Foreman, MD   40 mEq at 01/29/17 [phone]  . sodium chloride (OCEAN) 0.65 % nasal spray 1 spray  1 spray Each Nare PRN Bettey Costa, MD          Allergies: No Known Allergies    Past Medical History: Past Medical History:  Diagnosis Date  . #[id number] 2006  . Anxiety   . Breast cancer [facility]) 2006   right breast lumpectomy with radiation  . Colon polyp    adenoma  . GERD (gastroesophageal reflux disease) 04/04/2015  . Heart disease   . History of gastric ulcer 04/2001  . History of right breast cancer    post surgery/ XRT  . MVP (mitral valve prolapse)    dental prophylaxis  . Osteoporosis   . Osteoporosis, post-menopausal   . Surgical menopause      Past Surgical History: Past Surgical History:  Procedure Laterality Date  . BREAST EXCISIONAL BIOPSY Right 2006   breast ca  . CATARACT EXTRACTION    . CHOLECYSTECTOMY    . 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 COLONOSCOPY  11/29/2004   PH Adenomatous Polyps, [facility] (Brother)  . COLONOSCOPY  03/14/2010   PH Adenomatous Polyps, [facility] (Brother); CBF 03/2015, Ltr mailed 01/19/2015 (dw)  . COLONOSCOPY  05/05/2015   PH Adenomatous Polyps, [facility] (Brother); No repeat due to age per RTE (dw)  . COLONOSCOPY WITH PROPOFOL N/A 05/05/2015   Procedure: COLONOSCOPY WITH PROPOFOL;  Surgeon: Manya Silvas, MD;  Location: [facility] ENDOSCOPY;  Service: Endoscopy;  Laterality: N/A;  . ESOPHAGOGASTRODUODENOSCOPY  12/24/2006   01/06/2004, 08/27/2001, 06/18/2001  . ESOPHAGOGASTRODUODENOSCOPY (EGD) WITH PROPOFOL N/A 05/05/2015   Procedure: ESOPHAGOGASTRODUODENOSCOPY (EGD) WITH PROPOFOL;  Surgeon: Manya Silvas, MD;  Location: [facility] ENDOSCOPY;  Service: Endoscopy;  Laterality: N/A;  . EYE SURGERY     cataracts removed  . MASTECTOMY PARTIAL / LUMPECTOMY    . NASAL SEPTUM SURGERY    . NASAL SEPTUM SURGERY    . ovarian cyst removed     . ROTATOR CUFF REPAIR Right   . TONSILLECTOMY    . TONSILLECTOMY    . TOTAL ABDOMINAL HYSTERECTOMY W/ BILATERAL SALPINGOOPHORECTOMY  1980s   for endometriosis in 1980s     Family History: Family History  Problem Relation Age of Onset  . Breast cancer Sister 27  . Multiple myeloma Sister   . Breast cancer Maternal Aunt 60  . Breast cancer Maternal Aunt 60  . Colon cancer Brother   . Intracerebral hemorrhage Mother   . CVA Mother   . Hypertension Mother   . Stroke Mother   . Basal cell carcinoma Mother   . Melanoma Neg Hx      Social History: Social History   Socioeconomic History  . Marital status: Married    Spouse name: Not on file  . Number of children: 0  . Years of education: Doctoral Degree  . Highest education level: Not on file  Social Needs  . Financial resource strain: Not on file  . Food insecurity - worry: Not on file  . Food insecurity - inability: Not on file  . Transportation needs - medical: Not on file  . Transportation needs - non-medical: Not on file  Occupational History    Comment: worked  for [employer]  Tobacco Use  . Smoking status: Never Smoker  . Smokeless tobacco: Never Used  Substance and Sexual Activity  . Alcohol use: No  . Drug use: No  . Sexual activity: Not on file  Other Topics Concern  . Not on file  Social History Narrative  . Not on file     Review of Systems: Gen: No fevers or chills, no weight loss  HEENT: No vision or hearing problems CV: No chest pain or shortness of breath Resp: No cough or sputum production GI: No nausea or vomiting GU : No blood in the urine, no history of kidney stones MS: No acute complaints Derm:   No complaints Psych: No complaints Heme: No complaints Neuro: No complaints Endocrine: No complaints  Vital Signs: Blood pressure (!) 118/55, pulse 73, temperature 97.7 F (36.5 C), temperature source Oral, resp. rate 18, height _0  (1.702 m), weight 46 kg (101 lb 6.4  oz), SpO2 95 %.   Intake/Output Summary (Last 24 hours) at 01/29/2017 1114 Last data filed at 01/29/2017 1027 Gross per 24 hour  Intake 1769 ml  Output 1400 ml  Net 369 ml    Weight trends: Filed Weights   01/28/17 1139 01/28/17 1501  Weight: 47.2 kg (104 lb) 46 kg (101 lb 6.4 oz)    Physical Exam: General:  No acute distress, laying in the bed  HEENT  temporal muscle wasting, moist oral mucous membranes, anicteric  Neck:  Supple, no JVD  Lungs:  Normal breathing effort, clear to auscultation  Heart::  Regular rhythm, no rub or gallop  Abdomen:  Soft, nontender  Extremities:  No peripheral edema  Neurologic:  Alert, oriented  Skin:  No acute rashes             Lab results: Basic Metabolic Panel: Recent Labs  Lab 01/28/17 1136 01/28/17 1544 01/29/17 0414  NA 118* 119* 119*  K 3.6 3.4* 2.7*  CL 84* 85* 85*  CO2 _1 GLUCOSE 109* 121* 128*  BUN _2 CREATININE 0.40* 0.35* 0.37*  CALCIUM 8.6* 8.5* 8.4*    Liver Function Tests: No results for input(s): AST, ALT, ALKPHOS, BILITOT, PROT, ALBUMIN in the last 168 hours. No results for input(s): LIPASE, AMYLASE in the last 168 hours. No results for input(s): AMMONIA in the last 168 hours.  CBC: Recent Labs  Lab 01/28/17 1136 01/28/17 1544  WBC 5.1 6.1  HGB 13.1 12.4  HCT 37.3 35.8  MCV 93.4 94.2  PLT 239 221    Cardiac Enzymes: No results for input(s): CKTOTAL, TROPONINI in the last 168 hours.  BNP: Invalid input(s): POCBNP  CBG: Recent Labs  Lab 01/28/17 1146  GLUCAP 102*    Microbiology: No results found for this or any previous visit (from the past 720 hour(s)).   Coagulation Studies: No results for input(s): LABPROT, INR in the last 72 hours.  Urinalysis: Recent Labs    01/28/17 1136  COLORURINE YELLOW*  LABSPEC 1.010  PHURINE 8.0  GLUCOSEU NEGATIVE  HGBUR NEGATIVE  BILIRUBINUR NEGATIVE  KETONESUR NEGATIVE  PROTEINUR NEGATIVE  NITRITE NEGATIVE  LEUKOCYTESUR NEGATIVE         Imaging:  No results found.   Assessment & Plan: Pt is a 77 y.o. Caucasian  female with a PMHX of anxiety, osteoporosis, breast cancer, gastric ulcer, mitral valve prolapse, hypertension, was admitted on 01/28/2017 with acute syncope and found to have severe hyponatremia and hypokalemia.  1.  Acute hyponatremia  on Chronic hyponatremia -Patient's baseline sodium appears to be 132  from NOV 2018 She was taking hydrochlorothiazide as outpatient which is not listed on her home medications.  She also takes MiraLAX on a daily basis.  Hyponatremia appears to be related volume losses from this combination Plan to obtain urine studies Continue IV saline Supplementation  hold hydrochlorothiazide MiraLAX  Hypokalemia Add Spironolactone Supplement potassium as necessary

## 2017-01-29 NOTE — Plan of Care (Signed)
Pt is progressing. Ambulated to restroom multiple times today.

## 2017-01-30 ENCOUNTER — Encounter: Payer: Self-pay | Admitting: *Deleted

## 2017-01-30 ENCOUNTER — Inpatient Hospital Stay: Payer: Medicare Other

## 2017-01-30 DIAGNOSIS — E43 Unspecified severe protein-calorie malnutrition: Secondary | ICD-10-CM

## 2017-01-30 LAB — BASIC METABOLIC PANEL
Anion gap: 6 (ref 5–15)
BUN: 10 mg/dL (ref 6–20)
CALCIUM: 8.3 mg/dL — AB (ref 8.9–10.3)
CO2: 24 mmol/L (ref 22–32)
CREATININE: 0.36 mg/dL — AB (ref 0.44–1.00)
Chloride: 91 mmol/L — ABNORMAL LOW (ref 101–111)
GFR calc Af Amer: >60 mL/min (ref >60)
GLUCOSE: 99 mg/dL (ref 65–99)
POTASSIUM: 4.4 mmol/L (ref 3.5–5.1)
SODIUM: 121 mmol/L — AB (ref 135–145)

## 2017-01-30 LAB — SODIUM, URINE, RANDOM: Sodium, Ur: 138 mmol/L

## 2017-01-30 LAB — OSMOLALITY, URINE: OSMOLALITY UR: 496 mosm/kg (ref 300–900)

## 2017-01-30 LAB — CHLORIDE, URINE, RANDOM: Chloride Urine: 164 mmol/L

## 2017-01-30 MED ORDER — FLUTICASONE PROPIONATE 50 MCG/ACT NA SUSP
1.0000 | Freq: Every day | NASAL | Status: DC
Start: 2017-01-30 — End: 2017-02-02
  Administered 2017-01-30: 1 via NASAL
  Filled 2017-01-30: qty 16

## 2017-01-30 MED ORDER — GUAIFENESIN-DM 100-10 MG/5ML PO SYRP
5.0000 mL | ORAL_SOLUTION | ORAL | Status: DC | PRN
  Administered 2017-01-30 – 2017-02-02 (×14): 5 mL via ORAL
  Filled 2017-01-30 (×15): qty 5

## 2017-01-30 NOTE — Progress Notes (Addendum)
[place] [facility] [place] NAME: Katie Harding    MR#:  [id number]  DATE OF BIRTH:  04-10-1940  SUBJECTIVE:   Patient has cough this am that kept her up at night   REVIEW OF SYSTEMS:    Review of Systems  Constitutional: Negative for fever, chills weight loss Generalized weakness HENT: Negative for ear pain, nosebleeds, congestion, facial swelling, rhinorrhea, neck pain, neck stiffness and ear discharge.  ++postnasal drip Respiratory: ++ for cough,NO shortness of breath, wheezing  Cardiovascular: Negative for chest pain, palpitations and leg swelling.  Gastrointestinal: Negative for heartburn, abdominal pain, vomiting, diarrhea or consitpation Genitourinary: Negative for dysuria, urgency, frequency, hematuria Musculoskeletal: Negative for back pain or joint pain Neurological: Negative for dizziness, seizures, syncope, focal weakness,  numbness and headaches.  Hematological: Does not bruise/bleed easily.  Psychiatric/Behavioral: Negative for hallucinations, confusion, dysphoric mood    Tolerating Diet:yes      DRUG ALLERGIES:  No Known Allergies  VITALS:  Blood pressure (!) 145/78, pulse 77, temperature 98.9 F (37.2 C), temperature source Oral, resp. rate 20, height 5\' 7"  (1.702 m), weight 46 kg (101 lb 6.4 oz), SpO2 94 %.  PHYSICAL EXAMINATION:  Constitutional: Appears well-developed and well-nourished. No distress. HENT: Normocephalic. Marland Kitchen Oropharynx is clear and moist.  Eyes: Conjunctivae and EOM are normal. PERRLA, no scleral icterus.  Neck: Normal ROM. Neck supple. No JVD. No tracheal deviation. CVS: RRR, S1/S2 +, no murmurs, no gallops, no carotid bruit.  Pulmonary: Effort and breath sounds normal, no stridor, rhonchi, wheezes, rales.  Abdominal: Soft. BS +,  no distension, tenderness, rebound or guarding.  Musculoskeletal: Normal range of motion. No edema and no tenderness.  Neuro: Alert. CN 2-12 grossly intact. No focal  deficits. Skin: Skin is warm and dry. No rash noted. Psychiatric: Normal mood and affect.      LABORATORY PANEL:   CBC Recent Labs  Lab 01/28/17 1544  WBC 6.1  HGB 12.4  HCT 35.8  PLT 221   ------------------------------------------------------------------------------------------------------------------  Chemistries  Recent Labs  Lab 01/30/17 0346  NA 121*  K 4.4  CL 91*  CO2 24  GLUCOSE 99  BUN 10  CREATININE 0.36*  CALCIUM 8.3*   ------------------------------------------------------------------------------------------------------------------  Cardiac Enzymes No results for input(s): TROPONINI in the last 168 hours. ------------------------------------------------------------------------------------------------------------------  RADIOLOGY:  Dg Chest 1 View  Result Date: 01/30/2017 CLINICAL DATA:  77 year old female with nonproductive cough. Unable the sleep due to symptoms. EXAM: CHEST 1 VIEW COMPARISON:  Chest CTA 03/21/2016 FINDINGS: Portable AP upright view at 0753 hours. Chronic large lung volumes. Mediastinal contours remain within normal limits. Visualized tracheal air column is within normal limits. Chronic biapical scarring. No pneumothorax or pulmonary edema. Possible trace right pleural effusion visible up the lateral costophrenic angle, but no confluent pulmonary opacity. IMPRESSION: Chronic lung disease with hyperinflation. Possible small right pleural effusion, but no other acute cardiopulmonary abnormality identified. Electronically Signed   By: Genevie Ann M.D.   On: 01/30/2017 08:06     ASSESSMENT AND PLAN:    77 y.o. female with a known history of anxiety, history of breast cancer, GERD comes to the emergency room after she had a syncopal episode at a beauty salon.   1. Hyponatremia: Hyponatremia is from HCTZ Patient was advised not to continue HCTZ when she is discharged from home Sodium level still low, however slowly improving Continue IV  fluids Appreciate nephrology consultation  2.  GERD continue PPI  3.  Chronic anxiety continue Xanax as  needed  4. Essential HTN: Continue  Metoprolol   5. Hypokalemia and hypoMg:  repleted  6. Cough: Chest x-ray without evidence of pneumonia Flonase for postnasal drip 7. 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    PT consult for d/c planning   Management plans discussed with the patient and she is in agreement.  CODE STATUS: DNR TOTAL TIME TAKING CARE OF THIS PATIENT: 25 minutes.     POSSIBLE D/C 2 days, DEPENDING ON CLINICAL CONDITION.   Horton Ellithorpe M.D on 01/30/2017 at 9:51 AM  Between 7am to 6pm - Pager - [phone] After 6pm go to [email or web address] - password [email or web address] [facility]  Office  [phone]  CC: Primary care physician; Rusty Aus, MD  Note: This dictation was prepared with Dragon dictation along with smaller phrase technology. Any transcriptional errors that result from this process are unintentional.

## 2017-01-30 NOTE — Progress Notes (Signed)
Initial Nutrition Assessment  DOCUMENTATION CODES:   Severe malnutrition in context of chronic illness  INTERVENTION:   Ensure Enlive po BID, each supplement provides 350 kcal and 20 grams of protein  Magic cup TID with meals, each supplement provides 290 kcal and 9 grams of protein  MVI daily   Recommend check Mg and P as pt likely at moderate refeeding risk.   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  GOAL:   Patient will meet greater than or equal to 90% of their needs  MONITOR:   PO intake, Supplement acceptance, Labs, Weight trends, I & O's, Skin  REASON FOR ASSESSMENT:   Other (Comment)(low BMI)    ASSESSMENT:   77 y.o. Caucasian  female with a PMHX of anxiety, osteoporosis, breast cancer, gastric ulcer, mitral valve prolapse, hypertension, was admitted on 01/28/2017 with acute syncope and found to have severe hyponatremia and hypokalemia.   RD familiar with this pt from previous admit. Pt with chronic poor appetite and oral intake. Per chart, pt with slow decline in her weight from 108lbs down to 101lbs over the past 5 months; this is not significant for the time frame but worth noting given history. Pt is currently eating 100% of her meals in hospital. Pt ordered for Prostat and Ensure. RD will add Magic Cups to help pt meet her estimated protein needs. Would recommend check Mg and P as pt likely at moderate refeeding risk.   Medications reviewed and include: Ca- Vit D, lovenox, Mg oxide, reglan, MVI, protonix, aldactone, NaCl @75ml /hr, zofran  Labs reviewed: Na 121(L), K 4.4 wnl, Cl 91(L), creat 0.36(L), Ca 8.3(L)  Nutrition-Focused physical exam completed. Findings are severe fat and muscle depletions over entire body, and no edema.   Diet Order:  Diet regular Room service appropriate? Yes; Fluid consistency:  Thin  EDUCATION NEEDS:   No education needs have been identified at this time  Skin: Reviewed RN Assessment  Last BM:  1/2- type 6  Height:   Ht Readings from Last 1 Encounters:  01/28/17 5\' 7"  (1.702 m)    Weight:   Wt Readings from Last 1 Encounters:  01/28/17 101 lb 6.4 oz (46 kg)    Ideal Body Weight:  61.36 kg  BMI:  Body mass index is 15.88 kg/m.  Estimated Nutritional Needs:   Kcal:  1400-1600kcal/day   Protein:  69-78g/day   Fluid:  >1.4L/day   Koleen Distance MS, RD, LDN Pager #[phone] After Hours Pager: [phone]

## 2017-01-30 NOTE — Care Management (Signed)
Patient admitted from home with Hyponatremia. Patient lives at home with Husband.  PCP Emily Filbert.  Pharmacy [place].  PT has assessed patient and recommended home health PT.  Patient agreeable to services, and states that she would like to use [facility].  Patient has RW, cane, and shower seat in the home.  Referral made to Tim with [facility].  Awaiting to see if agency can accept the referral.  RNCM following.

## 2017-01-30 NOTE — Progress Notes (Signed)
[facility]  ROUNDING NOTE   Subjective:  Serum sodium is a bit higher today of 121. Overall patient is feeling a bit better at the moment. Patient remains on 0.9 normal saline at 75 cc/h for now.   Objective:  Vital signs in last 24 hours:  Temp:  [98.9 F (37.2 C)-99.1 F (37.3 C)] 98.9 F (37.2 C) (01/02 0349) Pulse Rate:  [75-77] 77 (01/02 0349) Resp:  [16-20] 20 (01/02 0349) BP: (128-145)/(67-78) 145/78 (01/02 0349) SpO2:  [94 %-97 %] 94 % (01/02 0349)  Weight change:  Filed Weights   01/28/17 1139 01/28/17 1501  Weight: 47.2 kg (104 lb) 46 kg (101 lb 6.4 oz)    Intake/Output: I/O last 3 completed shifts: In: 3593.8 [P.O.:960; I.V.:2633.8] Out: 3050 [Urine:3050]   Intake/Output this shift:  Total I/O In: 1132 [P.O.:480; I.V.:652] Out: -   Physical Exam: General: No acute distress  Head: Normocephalic, atraumatic. Moist oral mucosal membranes  Eyes: Anicteric  Neck: Supple, trachea midline  Lungs:  Clear to auscultation, normal effort  Heart: S1S2 no rubs  Abdomen:  Soft, nontender, bowel sounds present  Extremities: No peripheral edema.  Neurologic: Awake, alert, following commands  Skin: No lesions       Basic Metabolic Panel: Recent Labs  Lab 01/28/17 1136 01/28/17 1544 01/29/17 0414 01/29/17 1500 01/30/17 0346  NA 118* 119* 119* 119* 121*  K 3.6 3.4* 2.7* 4.1 4.4  CL 84* 85* 85* 88* 91*  CO2 26 27 27 26 24   GLUCOSE 109* 121* 128* 107* 99  BUN 12 11 12 11 10   CREATININE 0.40* 0.35* 0.37* 0.41* 0.36*  CALCIUM 8.6* 8.5* 8.4* 8.3* 8.3*    Liver Function Tests: No results for input(s): AST, ALT, ALKPHOS, BILITOT, PROT, ALBUMIN in the last 168 hours. No results for input(s): LIPASE, AMYLASE in the last 168 hours. No results for input(s): AMMONIA in the last 168 hours.  CBC: Recent Labs  Lab 01/28/17 1136 01/28/17 1544  WBC 5.1 6.1  HGB 13.1 12.4  HCT 37.3 35.8  MCV 93.4 94.2  PLT 239 221    Cardiac Enzymes: No  results for input(s): CKTOTAL, CKMB, CKMBINDEX, TROPONINI in the last 168 hours.  BNP: Invalid input(s): POCBNP  CBG: Recent Labs  Lab 01/28/17 1146  GLUCAP 102*    Microbiology: Results for orders placed or performed during the hospital encounter of 08/31/16  Urine Culture     Status: Abnormal   Collection Time: 08/31/16  1:00 PM  Result Value Ref Range Status   Specimen Description URINE, RANDOM  Final   Special Requests NONE  Final   Culture >=100,000 COLONIES/mL CITROBACTER FREUNDII (A)  Final   Report Status 09/02/2016 FINAL  Final   Organism ID, Bacteria CITROBACTER FREUNDII (A)  Final      Susceptibility   Citrobacter freundii - MIC*    CEFAZOLIN >=64 RESISTANT Resistant     CEFTRIAXONE <=1 SENSITIVE Sensitive     CIPROFLOXACIN <=0.25 SENSITIVE Sensitive     GENTAMICIN <=1 SENSITIVE Sensitive     IMIPENEM <=0.25 SENSITIVE Sensitive     NITROFURANTOIN <=16 SENSITIVE Sensitive     TRIMETH/SULFA <=20 SENSITIVE Sensitive     PIP/TAZO <=4 SENSITIVE Sensitive     * >=100,000 COLONIES/mL CITROBACTER FREUNDII    Coagulation Studies: No results for input(s): LABPROT, INR in the last 72 hours.  Urinalysis: Recent Labs    01/28/17 1136  COLORURINE YELLOW*  LABSPEC 1.010  PHURINE 8.0  GLUCOSEU NEGATIVE  HGBUR NEGATIVE  BILIRUBINUR NEGATIVE  KETONESUR NEGATIVE  PROTEINUR NEGATIVE  NITRITE NEGATIVE  LEUKOCYTESUR NEGATIVE      Imaging: Dg Chest 1 View  Result Date: 01/30/2017 CLINICAL DATA:  77 year old female with nonproductive cough. Unable the sleep due to symptoms. EXAM: CHEST 1 VIEW COMPARISON:  Chest CTA 03/21/2016 FINDINGS: Portable AP upright view at 0753 hours. Chronic large lung volumes. Mediastinal contours remain within normal limits. Visualized tracheal air column is within normal limits. Chronic biapical scarring. No pneumothorax or pulmonary edema. Possible trace right pleural effusion visible up the lateral costophrenic angle, but no confluent  pulmonary opacity. IMPRESSION: Chronic lung disease with hyperinflation. Possible small right pleural effusion, but no other acute cardiopulmonary abnormality identified. Electronically Signed   By: Genevie Ann M.D.   On: 01/30/2017 08:06     Medications:   . sodium chloride 75 mL/hr at 01/29/17 2247   . ALPRAZolam  0.125 mg Oral QID  . calcium citrate-vitamin D  1 tablet Oral BID  . cholecalciferol  2,000 Units Oral Daily  . enoxaparin (LOVENOX) injection  40 mg Subcutaneous Q24H  . feeding supplement (ENSURE ENLIVE)  1 Bottle Oral BID BM  . feeding supplement (PRO-STAT SUGAR FREE 64)  30 mL Oral BID BM  . fluticasone  1 spray Each Nare Daily  . loratadine  10 mg Oral Daily  . magnesium oxide  400 mg Oral BID  . metoCLOPramide  5 mg Oral QHS  . metoprolol succinate  25 mg Oral Q breakfast  . metoprolol succinate  25 mg Oral Q supper  . metoprolol succinate  25 mg Oral QHS  . metoprolol succinate  50 mg Oral Q lunch  . multivitamin with minerals  1 tablet Oral Daily  . pantoprazole  40 mg Oral Daily  . spironolactone  25 mg Oral Daily   acetaminophen **OR** acetaminophen, guaiFENesin-dextromethorphan, ondansetron **OR** ondansetron (ZOFRAN) IV, phenol-menthol, sodium chloride  Assessment/ Plan:  77 y.o. female female with a PMHX of anxiety, osteoporosis, breast cancer, gastric ulcer, mitral valve prolapse, hypertension, was admitted on 01/28/2017 with acute syncope and found to have severe hyponatremia and hypokalemia  1.  Hyponatremia.  Baseline serum sodium 132 from November 2018.  She was on HCTZ as an outpatient.  Continue to hold this.  Continue IV fluid hydration with 0.9 normal saline.  2.  Hypokalemia.  Significantly improved.  Okay to continue spironolactone for now.   3.  Hypertension.  Maintain the patient on metoprolol for now.      LOS: 2 Quinterrius Errington 1/2/201911:56 AM

## 2017-01-30 NOTE — Care Management Note (Signed)
Case Management Note  Patient Details  Name: Katie Harding MRN: [id number] Date of Birth: 03-24-1940  Subjective/Objective:                 Patient admitted with critical sodium level.  Prior to this admission, independent with all her adls, and no issues accessing medical care, paying for meds or with transportation.  Lives with her husband and the couple is currently downsizing and moving into [employer].  Current with her PCP- Emily Filbert   Action/Plan:   Expected Discharge Date:                  Expected Discharge Plan:     In-House Referral:     Discharge planning Services     Post Acute Care Choice:    Choice offered to:     DME Arranged:    DME Agency:     HH Arranged:    HH Agency:     Status of Service:     If discussed at [place] of Stay Meetings, dates discussed:    Additional Comments:  Katrina Stack, RN 01/30/2017, 9:11 AM

## 2017-01-30 NOTE — Care Management (Signed)
Heads up referral accepted by [facility]

## 2017-01-30 NOTE — Care Management Important Message (Signed)
Important Message  Patient Details  Name: Katie Harding MRN: [id number] Date of Birth: 03-May-1940   Medicare Important Message Given:  Yes    Beverly Sessions, RN 01/30/2017, 2:07 PM

## 2017-01-30 NOTE — Evaluation (Signed)
Physical Therapy Evaluation Patient Details Name: Katie Harding MRN: [id number] DOB: 15-May-1940 Today's Date: 01/30/2017   History of Present Illness  presented to ER after syncopal episode in [employer]; admitted with acute hyponatremia secondary to dehydration.  Na+ replaced, currently 121.  Clinical Impression  Upon evaluation, patient alert and oriented; follows all commands and demonstrates good insight/safety awareness.  Bilat UE/LE strength and ROM grossly symmetrical and WFL; no focal weakness noted.  Does report mild paresthesia bilat ankles and plantar surfaces of bilat feet since fall/injury in July; unchanged with this admission.  Demonstrates ability to complete bed mobility with mod indep; sit/stand, basic transfers and gait (220') with RW, cga/close sup.  Slow, guarded cadence and overall gait performance, but no overt buckling or LOB.  Higher-level balance deficits evident (mild deviation with head turns); recommend continued use of RW with mobility for optimal safety.  Does voice preference for RW at this time due to weakness from acute illness; has access to one upon discharge. Would benefit from skilled PT to address above deficits and promote optimal return to PLOF; Recommend transition to [facility] upon discharge from acute hospitalization.    Follow Up Recommendations Home health PT    Equipment Recommendations  (has RW, sPC)    Recommendations for Other Services       Precautions / Restrictions Precautions Precautions: Fall Restrictions Weight Bearing Restrictions: No      Mobility  Bed Mobility Overal bed mobility: Modified Independent                Transfers Overall transfer level: Needs assistance Equipment used: Rolling walker (2 wheeled) Transfers: Sit to/from Stand Sit to Stand: Supervision         General transfer comment: cuing for hand placement  Ambulation/Gait Ambulation/Gait assistance: Min guard;Supervision Ambulation Distance  (Feet): 220 Feet Assistive device: Rolling walker (2 wheeled)   Gait velocity: 10' walk time, 8 seconds Gait velocity interpretation: Below normal speed for age/gender General Gait Details: reciprocal stepping pattern with fair cadence, gait speed.  Slightly guarded and cautious, but no overt buckling or LOB.  Stairs            Wheelchair Mobility    Modified Rankin (Stroke Patients Only)       Balance Overall balance assessment: Needs assistance Sitting-balance support: No upper extremity supported;Feet supported Sitting balance-Leahy Scale: Good     Standing balance support: Bilateral upper extremity supported Standing balance-Leahy Scale: Fair                               Pertinent Vitals/Pain Pain Assessment: No/denies pain    Home Living Family/patient expects to be discharged to:: Assisted living Living Arrangements: Spouse/significant other Available Help at Discharge: Family Type of Home: Assisted living       Home Layout: One level Home Equipment: Environmental consultant - 2 wheels;Cane - single point;Grab bars - tub/shower      Prior Function Level of Independence: Independent with assistive device(s)         Comments: At true baseline, indep without assist device for all ADLS, mobility; has been using assist device (improved to [facility]) since fall with pelvic fracture in July, 2018.     Hand Dominance        Extremity/Trunk Assessment   Upper Extremity Assessment Upper Extremity Assessment: Overall WFL for tasks assessed    Lower Extremity Assessment Lower Extremity Assessment: Overall WFL for tasks assessed(grossly at least 4/5  throughout; reports residual paresthesia plantar surface bilat feel, bilat ankles after ortho injury July 2018)       Communication   Communication: No difficulties  Cognition Arousal/Alertness: Awake/alert Behavior During Therapy: WFL for tasks assessed/performed Overall Cognitive Status: Within Functional Limits  for tasks assessed                                        General Comments      Exercises     Assessment/Plan    PT Assessment Patient needs continued PT services  PT Problem List Decreased balance;Decreased activity tolerance;Decreased mobility       PT Treatment Interventions DME instruction;Gait training;Functional mobility training;Therapeutic activities;Therapeutic exercise;Balance training;Patient/family education    PT Goals (Current goals can be found in the Care Plan section)  Acute Rehab PT Goals Patient Stated Goal: to return home PT Goal Formulation: With patient Time For Goal Achievement: 02/13/17 Potential to Achieve Goals: Good    Frequency Min 2X/week   Barriers to discharge        Co-evaluation               AM-PAC PT "6 Clicks" Daily Activity  Outcome Measure   Difficulty moving from lying on back to sitting on the side of the bed? : None Difficulty sitting down on and standing up from a chair with arms (e.g., wheelchair, bedside commode, etc,.)?: None Help needed moving to and from a bed to chair (including a wheelchair)?: A Little Help needed walking in hospital room?: A Little Help needed climbing 3-5 steps with a railing? : A Little 6 Click Score: 17    End of Session Equipment Utilized During Treatment: Gait belt Activity Tolerance: Patient tolerated treatment well Patient left: in chair;with call bell/phone within reach;with chair alarm set Nurse Communication: Mobility status PT Visit Diagnosis: Muscle weakness (generalized) (M62.81);Difficulty in walking, not elsewhere classified (R26.2)    Time: 3244-0102 PT Time Calculation (min) (ACUTE ONLY): 23 min   Charges:   PT Evaluation $PT Eval Low Complexity: 1 Low PT Treatments $Therapeutic Activity: 8-22 mins   PT G Codes:   PT G-Codes **NOT FOR INPATIENT CLASS** Functional Assessment Tool Used: AM-PAC 6 Clicks Basic Mobility Functional Limitation: Mobility:  Walking and moving around Mobility: Walking and Moving Around Current Status (V2536): At least 20 percent but less than 40 percent impaired, limited or restricted Mobility: Walking and Moving Around Goal Status [phone]): At least 1 percent but less than 20 percent impaired, limited or restricted Mobility: Walking and Moving Around Discharge Status [phone]): At least 1 percent but less than 20 percent impaired, limited or restricted    Miliano Cotten H. Owens Shark, PT, DPT, NCS 01/30/17, 11:28 AM [phone]

## 2017-01-30 NOTE — Progress Notes (Signed)
Pt complaining of dry cough, throat lozenges not helping, and pt cannot sleep. MD paged. Dr. Jannifer Franklin to order some cough syrup. Will continue to monitor. Conley Simmonds, RN, BSN

## 2017-01-31 LAB — BASIC METABOLIC PANEL
ANION GAP: 7 (ref 5–15)
Anion gap: 7 (ref 5–15)
Anion gap: 7 (ref 5–15)
BUN: 9 mg/dL (ref 6–20)
BUN: 9 mg/dL (ref 6–20)
BUN: 9 mg/dL (ref 6–20)
CALCIUM: 7.8 mg/dL — AB (ref 8.9–10.3)
CO2: 23 mmol/L (ref 22–32)
CO2: 24 mmol/L (ref 22–32)
CO2: 24 mmol/L (ref 22–32)
CREATININE: 0.32 mg/dL — AB (ref 0.44–1.00)
CREATININE: 0.31 mg/dL — AB (ref 0.44–1.00)
Calcium: 7.9 mg/dL — ABNORMAL LOW (ref 8.9–10.3)
Calcium: 8 mg/dL — ABNORMAL LOW (ref 8.9–10.3)
Chloride: 86 mmol/L — ABNORMAL LOW (ref 101–111)
Chloride: 84 mmol/L — ABNORMAL LOW (ref 101–111)
Chloride: 86 mmol/L — ABNORMAL LOW (ref 101–111)
GFR calc non Af Amer: >60 mL/min (ref >60)
GFR calc non Af Amer: >60 mL/min (ref >60)
Glucose, Bld: 103 mg/dL — ABNORMAL HIGH (ref 65–99)
Glucose, Bld: 90 mg/dL (ref 65–99)
Glucose, Bld: 116 mg/dL — ABNORMAL HIGH (ref 65–99)
POTASSIUM: 3.4 mmol/L — AB (ref 3.5–5.1)
Potassium: 3.3 mmol/L — ABNORMAL LOW (ref 3.5–5.1)
Potassium: 3.8 mmol/L (ref 3.5–5.1)
Sodium: 116 mmol/L — CL (ref 135–145)
Sodium: 115 mmol/L — CL (ref 135–145)
Sodium: 117 mmol/L — CL (ref 135–145)

## 2017-01-31 LAB — SODIUM: Sodium: 127 mmol/L — ABNORMAL LOW (ref 135–145)

## 2017-01-31 MED ORDER — TOLVAPTAN 15 MG PO TABS: 15.0000 mg | ORAL_TABLET | Freq: Once | ORAL | Status: DC

## 2017-01-31 MED ORDER — TOLVAPTAN 15 MG PO TABS
15.0000 mg | ORAL_TABLET | ORAL | Status: DC
  Administered 2017-01-31: 15 mg via ORAL
  Filled 2017-01-31: qty 1

## 2017-01-31 MED ORDER — TOLVAPTAN 15 MG PO TABS
15.0000 mg | ORAL_TABLET | ORAL | Status: DC
  Filled 2017-01-31: qty 1

## 2017-01-31 NOTE — Progress Notes (Signed)
[facility]  ROUNDING NOTE   Subjective:  Serum sodium has dropped to 115 today. Patient to be started on ADH antagonist.   Objective:  Vital signs in last 24 hours:  Temp:  [98.2 F (36.8 C)-98.7 F (37.1 C)] 98.2 F (36.8 C) (01/03 1130) Pulse Rate:  [72-74] 74 (01/03 1130) Resp:  [18-20] 19 (01/03 1130) BP: (122-134)/(64-73) 122/73 (01/03 1130) SpO2:  [94 %-97 %] 94 % (01/03 1130)  Weight change:  Filed Weights   01/28/17 1139 01/28/17 1501  Weight: 47.2 kg (104 lb) 46 kg (101 lb 6.4 oz)    Intake/Output: I/O last 3 completed shifts: In: 2754.8 [P.O.:960; I.V.:1794.8] Out: 2750 [Urine:2750]   Intake/Output this shift:  Total I/O In: 1231 [P.O.:120; I.V.:1111] Out: 400 [Urine:400]  Physical Exam: General: No acute distress  Head: Normocephalic, atraumatic. Moist oral mucosal membranes  Eyes: Anicteric  Neck: Supple, trachea midline  Lungs:  Clear to auscultation, normal effort  Heart: S1S2 no rubs  Abdomen:  Soft, nontender, bowel sounds present  Extremities: No peripheral edema.  Neurologic: Awake, alert, following commands  Skin: No lesions       Basic Metabolic Panel: Recent Labs  Lab 01/29/17 0414 01/29/17 1500 01/30/17 0346 01/31/17 0433 01/31/17 0808  NA 119* 119* 121* 116* 115*  K 2.7* 4.1 4.4 3.3* 3.8  CL 85* 88* 91* 86* 84*  CO2 27 26 24 23 24   GLUCOSE 128* 107* 99 103* 90  BUN 12 11 10 9 9   CREATININE 0.37* 0.41* 0.36* <0.30* 0.32*  CALCIUM 8.4* 8.3* 8.3* 7.8* 7.9*    Liver Function Tests: No results for input(s): AST, ALT, ALKPHOS, BILITOT, PROT, ALBUMIN in the last 168 hours. No results for input(s): LIPASE, AMYLASE in the last 168 hours. No results for input(s): AMMONIA in the last 168 hours.  CBC: Recent Labs  Lab 01/28/17 1136 01/28/17 1544  WBC 5.1 6.1  HGB 13.1 12.4  HCT 37.3 35.8  MCV 93.4 94.2  PLT 239 221    Cardiac Enzymes: No results for input(s): CKTOTAL, CKMB, CKMBINDEX, TROPONINI in the last  168 hours.  BNP: Invalid input(s): POCBNP  CBG: Recent Labs  Lab 01/28/17 1146  GLUCAP 102*    Microbiology: Results for orders placed or performed during the hospital encounter of 08/31/16  Urine Culture     Status: Abnormal   Collection Time: 08/31/16  1:00 PM  Result Value Ref Range Status   Specimen Description URINE, RANDOM  Final   Special Requests NONE  Final   Culture >=100,000 COLONIES/mL CITROBACTER FREUNDII (A)  Final   Report Status 09/02/2016 FINAL  Final   Organism ID, Bacteria CITROBACTER FREUNDII (A)  Final      Susceptibility   Citrobacter freundii - MIC*    CEFAZOLIN >=64 RESISTANT Resistant     CEFTRIAXONE <=1 SENSITIVE Sensitive     CIPROFLOXACIN <=0.25 SENSITIVE Sensitive     GENTAMICIN <=1 SENSITIVE Sensitive     IMIPENEM <=0.25 SENSITIVE Sensitive     NITROFURANTOIN <=16 SENSITIVE Sensitive     TRIMETH/SULFA <=20 SENSITIVE Sensitive     PIP/TAZO <=4 SENSITIVE Sensitive     * >=100,000 COLONIES/mL CITROBACTER FREUNDII    Coagulation Studies: No results for input(s): LABPROT, INR in the last 72 hours.  Urinalysis: No results for input(s): COLORURINE, LABSPEC, PHURINE, GLUCOSEU, HGBUR, BILIRUBINUR, KETONESUR, PROTEINUR, UROBILINOGEN, NITRITE, LEUKOCYTESUR in the last 72 hours.  Invalid input(s): APPERANCEUR    Imaging: Dg Chest 1 View  Result Date: 01/30/2017 CLINICAL DATA:  77 year old female with nonproductive cough. Unable the sleep due to symptoms. EXAM: CHEST 1 VIEW COMPARISON:  Chest CTA 03/21/2016 FINDINGS: Portable AP upright view at 0753 hours. Chronic large lung volumes. Mediastinal contours remain within normal limits. Visualized tracheal air column is within normal limits. Chronic biapical scarring. No pneumothorax or pulmonary edema. Possible trace right pleural effusion visible up the lateral costophrenic angle, but no confluent pulmonary opacity. IMPRESSION: Chronic lung disease with hyperinflation. Possible small right pleural  effusion, but no other acute cardiopulmonary abnormality identified. Electronically Signed   By: Genevie Ann M.D.   On: 01/30/2017 08:06     Medications:    . ALPRAZolam  0.125 mg Oral QID  . calcium citrate-vitamin D  1 tablet Oral BID  . cholecalciferol  2,000 Units Oral Daily  . enoxaparin (LOVENOX) injection  40 mg Subcutaneous Q24H  . feeding supplement (ENSURE ENLIVE)  1 Bottle Oral BID BM  . feeding supplement (PRO-STAT SUGAR FREE 64)  30 mL Oral BID BM  . fluticasone  1 spray Each Nare Daily  . loratadine  10 mg Oral Daily  . magnesium oxide  400 mg Oral BID  . metoCLOPramide  5 mg Oral QHS  . metoprolol succinate  25 mg Oral Q breakfast  . metoprolol succinate  25 mg Oral Q supper  . metoprolol succinate  25 mg Oral QHS  . metoprolol succinate  50 mg Oral Q lunch  . multivitamin with minerals  1 tablet Oral Daily  . pantoprazole  40 mg Oral Daily  . spironolactone  25 mg Oral Daily   acetaminophen **OR** acetaminophen, guaiFENesin-dextromethorphan, ondansetron **OR** ondansetron (ZOFRAN) IV, phenol-menthol, sodium chloride  Assessment/ Plan:  77 y.o. female female with a PMHX of anxiety, osteoporosis, breast cancer, gastric ulcer, mitral valve prolapse, hypertension, was admitted on 01/28/2017 with acute syncope and found to have severe hyponatremia and hypokalemia  1.  Hyponatremia.  Baseline serum sodium 132 from November 2018.  She was on HCTZ as an outpatient.   -  Serum sodium has dropped to 115.  We will go ahead and start the patient on tolvaptan 15mg  po daily and monitor serum sodium closely.   2.  Hypokalemia.  K improved to 3.8, will stop spironolactone.    3.  Hypertension.  Continue metoprolol.      LOS: 3 Kent Braunsch[place] 1/3/20192:15 PM

## 2017-01-31 NOTE — Progress Notes (Signed)
PT Cancellation Note  Patient Details Name: VALYN LATCHFORD MRN: [id number] DOB: 05-01-1940   Cancelled Treatment:    Reason Eval/Treat Not Completed: Medical issues which prohibited therapy. Per chart review, Na critically low (115) today. Re attempt per lab values at a later date.    Larae Grooms, PTA 01/31/2017, 2:49 PM

## 2017-01-31 NOTE — Progress Notes (Signed)
[facility] [place] NAME: Katie Harding    MR#:  [id number]  DATE OF BIRTH:  [birth year]  SUBJECTIVE:  Patient denies any complaints.  She is patiently waiting for sodium to get better.  REVIEW OF SYSTEMS:   Review of Systems  Constitutional: Negative for chills, fever and weight loss.  HENT: Negative for ear discharge, ear pain and nosebleeds.   Eyes: Negative for blurred vision, pain and discharge.  Respiratory: Negative for sputum production, shortness of breath, wheezing and stridor.   Cardiovascular: Negative for chest pain, palpitations, orthopnea and PND.  Gastrointestinal: Negative for abdominal pain, diarrhea, nausea and vomiting.  Genitourinary: Negative for frequency and urgency.  Musculoskeletal: Negative for back pain and joint pain.  Neurological: Negative for sensory change, speech change, focal weakness and weakness.  Psychiatric/Behavioral: Negative for depression and hallucinations. The patient is not nervous/anxious.    Tolerating Diet:yes Tolerating PT: HHPT  DRUG ALLERGIES:  No Known Allergies  VITALS:  Blood pressure 127/71, pulse 73, temperature 98.7 F (37.1 C), temperature source Oral, resp. rate 18, height 5\' 7"  (1.702 m), weight 46 kg (101 lb 6.4 oz), SpO2 95 %.  PHYSICAL EXAMINATION:   Physical Exam  GENERAL:  77 y.o.-year-old patient lying in the bed with no acute distress.  EYES: Pupils equal, round, reactive to light and accommodation. No scleral icterus. Extraocular muscles intact.  HEENT: Head atraumatic, normocephalic. Oropharynx and nasopharynx clear.  NECK:  Supple, no jugular venous distention. No thyroid enlargement, no tenderness.  LUNGS: Normal breath sounds bilaterally, no wheezing, rales, rhonchi. No use of accessory muscles of respiration.  CARDIOVASCULAR: S1, S2 normal. No murmurs, rubs, or gallops.  ABDOMEN: Soft, nontender, nondistended. Bowel sounds present. No organomegaly or  mass.  EXTREMITIES: No cyanosis, clubbing or edema b/l.    NEUROLOGIC: Cranial nerves II through XII are intact. No focal Motor or sensory deficits b/l.   PSYCHIATRIC:  patient is alert and oriented x 3.  SKIN: No obvious rash, lesion, or ulcer.   LABORATORY PANEL:  CBC Recent Labs  Lab 01/28/17 1544  WBC 6.1  HGB 12.4  HCT 35.8  PLT 221    Chemistries  Recent Labs  Lab 01/31/17 0808  NA 115*  K 3.8  CL 84*  CO2 24  GLUCOSE 90  BUN 9  CREATININE 0.32*  CALCIUM 7.9*   Cardiac Enzymes No results for input(s): TROPONINI in the last 168 hours. RADIOLOGY:  Dg Chest 1 View  Result Date: 01/30/2017 CLINICAL DATA:  77 year old female with nonproductive cough. Unable the sleep due to symptoms. EXAM: CHEST 1 VIEW COMPARISON:  Chest CTA 03/21/2016 FINDINGS: Portable AP upright view at 0753 hours. Chronic large lung volumes. Mediastinal contours remain within normal limits. Visualized tracheal air column is within normal limits. Chronic biapical scarring. No pneumothorax or pulmonary edema. Possible trace right pleural effusion visible up the lateral costophrenic angle, but no confluent pulmonary opacity. IMPRESSION: Chronic lung disease with hyperinflation. Possible small right pleural effusion, but no other acute cardiopulmonary abnormality identified. Electronically Signed   By: Genevie Ann M.D.   On: 01/30/2017 08:06   ASSESSMENT AND PLAN:  77 y.o.femalewith a known history of anxiety, history of breast cancer, GERD comes to the emergency room after she had a syncopal episode at a beauty salon.  1. Hyponatremia: likely from chronic use of HCTZ Patient was advised not to continue HCTZ when she is discharged to home Sodium level still low, however slowly improving 118--119--121--116--115--Tolvaptan today  Appreciate nephrology consultation  2. GERD continue PPI  3. Chronic anxiety continue Xanax as needed  4. Essential HTN: Continue  Metoprolol   5. Hypokalemia and  hypoMg:  repleted  6. Cough: Chest x-ray without evidence of pneumonia Flonase for postnasal drip  7. Severe Malnutritionrelated to catabolic illness(h/o breast cancer, gastric ulcers and poor po intake )as evidenced by severe muscle depletion, severe fat depletion Cont with dietary RECS.  Case discussed with Care Management/Social Worker. Management plans discussed with the patient, family and they are in agreement.  CODE STATUS: DNR  DVT Prophylaxis: lovenox  TOTAL TIME TAKING CARE OF THIS PATIENT: *25* minutes.  >50% time spent on counselling and coordination of care  POSSIBLE D/C IN *1-2* DAYS, DEPENDING ON CLINICAL CONDITION.  Note: This dictation was prepared with Dragon dictation along with smaller phrase technology. Any transcriptional errors that result from this process are unintentional.  Fritzi Mandes M.D on 01/31/2017 at 11:42 AM  Between 7am to 6pm - Pager - [phone]  After 6pm go to [email or web address] - password [email or web address] [facility]  Office  [phone]  CC: Primary care physician; Rusty Aus, MDPatient ID: [name], female   DOB: December 24, 1940, 77 y.o.   MRN: [id number]

## 2017-01-31 NOTE — Progress Notes (Signed)
Notified Dr. Estanislado Pandy of sodium of 116 and he ordered the next lab for 0800 today to recheck sodium. Will continue to monitor the patient.

## 2017-02-01 LAB — SODIUM: SODIUM: 130 mmol/L — AB (ref 135–145)

## 2017-02-01 MED ORDER — POTASSIUM CHLORIDE 20 MEQ PO PACK
20.0000 meq | PACK | Freq: Two times a day (BID) | ORAL | Status: AC
  Administered 2017-02-01 (×2): 20 meq via ORAL
  Filled 2017-02-01 (×2): qty 1

## 2017-02-01 MED ORDER — POTASSIUM CHLORIDE 10 MEQ/100ML IV SOLN
10.0000 meq | INTRAVENOUS | Status: DC
  Filled 2017-02-01: qty 100

## 2017-02-01 NOTE — Progress Notes (Signed)
Patient has been admitted for syncope w/ hyponatremia. Patient has been placed on tolvaptan for euvolemic hyponatremia. Patient's sodium has risen >12 mEq in past 24 hours.  Spoke to MD to hold tomorrow mornings dose. MD notified and agrees with plan.  Tobie Lords, PharmD, BCPS Clinical Pharmacist 02/01/2017

## 2017-02-01 NOTE — Progress Notes (Signed)
Physical Therapy Treatment Patient Details Name: Katie Harding MRN: [id number] DOB: 1940/02/19 Today's Date: 02/01/2017    History of Present Illness presented to ER after syncopal episode in beauty shop; admitted with acute hyponatremia secondary to dehydration.  Na+ replaced, currently 130    PT Comments    Demonstrates increase in gait distance and overall activity tolerance, performing all at cga/close sup level of assist.  Continues to report optimal comfort/confidence with use of RW; will continue use at home. Higher-level balance deficits evident; requires UE support for safety/stabilization during all activities requiring altered stance positions (narrowed, SLS).  Patient with good awareness of deficits and good use of compensatory strategies as needed. Eager for planned discharge home next date; feels comfortable with current functional status and has necessary equipment at home.   Follow Up Recommendations  Home health PT     Equipment Recommendations       Recommendations for Other Services       Precautions / Restrictions Precautions Precautions: Fall Restrictions Weight Bearing Restrictions: No    Mobility  Bed Mobility Overal bed mobility: Independent                Transfers Overall transfer level: Modified independent Equipment used: Rolling walker (2 wheeled) Transfers: Sit to/from Stand Sit to Stand: Modified independent (Device/Increase time)            Ambulation/Gait Ambulation/Gait assistance: Supervision Ambulation Distance (Feet): 420 Feet Assistive device: Rolling walker (2 wheeled)       General Gait Details: reciprocal stepping pattern with good step height/lenght, min cuing for upward gaze/postural extension.  Slow, but steady; continues to report optimal comfort/confidence with use of RW   Stairs            Wheelchair Mobility    Modified Rankin (Stroke Patients Only)       Balance Overall balance assessment:  Needs assistance Sitting-balance support: No upper extremity supported;Feet supported Sitting balance-Leahy Scale: Good     Standing balance support: Bilateral upper extremity supported Standing balance-Leahy Scale: Good   Single Leg Stance - Right Leg: 2 Single Leg Stance - Left Leg: 3                        Cognition Arousal/Alertness: Awake/alert Behavior During Therapy: WFL for tasks assessed/performed Overall Cognitive Status: Within Functional Limits for tasks assessed                                        Exercises Other Exercises Other Exercises: Standing LE therex, 1x10, AROM with RW (UE support for balance/steadying only): heel raises, mini squats, marching, hip flex/ext/abduct/adduct.  Does require UE stabilization for safety with any activities involving priods of SLS.  good awareness of deficits/use of compenstatory strategies as needed    General Comments        Pertinent Vitals/Pain Pain Assessment: No/denies pain    Home Living                      Prior Function            PT Goals (current goals can now be found in the care plan section) Acute Rehab PT Goals Patient Stated Goal: to return home PT Goal Formulation: With patient Time For Goal Achievement: 02/13/17 Potential to Achieve Goals: Good Progress towards PT goals: Progressing toward goals  Frequency    Min 2X/week      PT Plan Current plan remains appropriate    Co-evaluation              AM-PAC PT "6 Clicks" Daily Activity  Outcome Measure  Difficulty turning over in bed (including adjusting bedclothes, sheets and blankets)?: None Difficulty moving from lying on back to sitting on the side of the bed? : None Difficulty sitting down on and standing up from a chair with arms (e.g., wheelchair, bedside commode, etc,.)?: A Little Help needed moving to and from a bed to chair (including a wheelchair)?: A Little Help needed walking in  hospital room?: A Little Help needed climbing 3-5 steps with a railing? : A Little 6 Click Score: 20    End of Session Equipment Utilized During Treatment: Gait belt Activity Tolerance: Patient tolerated treatment well Patient left: in chair;with call bell/phone within reach;with chair alarm set Nurse Communication: Mobility status PT Visit Diagnosis: Muscle weakness (generalized) (M62.81);Difficulty in walking, not elsewhere classified (R26.2)     Time: 1030-1045 PT Time Calculation (min) (ACUTE ONLY): 15 min  Charges:  $Gait Training: 8-22 mins                    G Codes:       Kolby Schara H. Owens Shark, PT, DPT, NCS 02/01/17, 10:53 AM [phone]

## 2017-02-01 NOTE — Progress Notes (Signed)
[facility]  ROUNDING NOTE   Subjective:  Serum sodium has significantly improved. Serum sodium up to 127. Tolvaptan has been held.   Objective:  Vital signs in last 24 hours:  Temp:  [97.8 F (36.6 C)-98.3 F (36.8 C)] 98.3 F (36.8 C) (01/04 0351) Pulse Rate:  [72-80] 72 (01/04 0351) Resp:  [16-19] 16 (01/04 0351) BP: (107-122)/(52-73) 115/64 (01/04 0351) SpO2:  [94 %] 94 % (01/04 0351)  Weight change:  Filed Weights   01/28/17 1139 01/28/17 1501  Weight: 47.2 kg (104 lb) 46 kg (101 lb 6.4 oz)    Intake/Output: I/O last 3 completed shifts: In: 5329 [P.O.:720; I.V.:1111] Out: 5501 [Urine:5500; Stool:1]   Intake/Output this shift:  No intake/output data recorded.  Physical Exam: General: No acute distress  Head: Normocephalic, atraumatic. Moist oral mucosal membranes  Eyes: Anicteric  Neck: Supple, trachea midline  Lungs:  Clear to auscultation, normal effort  Heart: S1S2 no rubs  Abdomen:  Soft, nontender, bowel sounds present  Extremities: No peripheral edema.  Neurologic: Awake, alert, following commands  Skin: No lesions       Basic Metabolic Panel: Recent Labs  Lab 01/29/17 1500 01/30/17 0346 01/31/17 0433 01/31/17 0808 01/31/17 1427 01/31/17 2224 02/01/17 0540  NA 119* 121* 116* 115* 117* 127* 130*  K 4.1 4.4 3.3* 3.8 3.4*  --   --   CL 88* 91* 86* 84* 86*  --   --   CO2 26 24 23 24 24   --   --   GLUCOSE 107* 99 103* 90 116*  --   --   BUN 11 10 9 9 9   --   --   CREATININE 0.41* 0.36* <0.30* 0.32* 0.31*  --   --   CALCIUM 8.3* 8.3* 7.8* 7.9* 8.0*  --   --     Liver Function Tests: No results for input(s): AST, ALT, ALKPHOS, BILITOT, PROT, ALBUMIN in the last 168 hours. No results for input(s): LIPASE, AMYLASE in the last 168 hours. No results for input(s): AMMONIA in the last 168 hours.  CBC: Recent Labs  Lab 01/28/17 1136 01/28/17 1544  WBC 5.1 6.1  HGB 13.1 12.4  HCT 37.3 35.8  MCV 93.4 94.2  PLT 239 221     Cardiac Enzymes: No results for input(s): CKTOTAL, CKMB, CKMBINDEX, TROPONINI in the last 168 hours.  BNP: Invalid input(s): POCBNP  CBG: Recent Labs  Lab 01/28/17 1146  GLUCAP 102*    Microbiology: Results for orders placed or performed during the hospital encounter of 08/31/16  Urine Culture     Status: Abnormal   Collection Time: 08/31/16  1:00 PM  Result Value Ref Range Status   Specimen Description URINE, RANDOM  Final   Special Requests NONE  Final   Culture >=100,000 COLONIES/mL CITROBACTER FREUNDII (A)  Final   Report Status 09/02/2016 FINAL  Final   Organism ID, Bacteria CITROBACTER FREUNDII (A)  Final      Susceptibility   Citrobacter freundii - MIC*    CEFAZOLIN >=64 RESISTANT Resistant     CEFTRIAXONE <=1 SENSITIVE Sensitive     CIPROFLOXACIN <=0.25 SENSITIVE Sensitive     GENTAMICIN <=1 SENSITIVE Sensitive     IMIPENEM <=0.25 SENSITIVE Sensitive     NITROFURANTOIN <=16 SENSITIVE Sensitive     TRIMETH/SULFA <=20 SENSITIVE Sensitive     PIP/TAZO <=4 SENSITIVE Sensitive     * >=100,000 COLONIES/mL CITROBACTER FREUNDII    Coagulation Studies: No results for input(s): LABPROT, INR in the last  72 hours.  Urinalysis: No results for input(s): COLORURINE, LABSPEC, PHURINE, GLUCOSEU, HGBUR, BILIRUBINUR, KETONESUR, PROTEINUR, UROBILINOGEN, NITRITE, LEUKOCYTESUR in the last 72 hours.  Invalid input(s): APPERANCEUR    Imaging: No results found.   Medications:    . ALPRAZolam  0.125 mg Oral QID  . calcium citrate-vitamin D  1 tablet Oral BID  . cholecalciferol  2,000 Units Oral Daily  . enoxaparin (LOVENOX) injection  40 mg Subcutaneous Q24H  . feeding supplement (ENSURE ENLIVE)  1 Bottle Oral BID BM  . feeding supplement (PRO-STAT SUGAR FREE 64)  30 mL Oral BID BM  . fluticasone  1 spray Each Nare Daily  . loratadine  10 mg Oral Daily  . magnesium oxide  400 mg Oral BID  . metoCLOPramide  5 mg Oral QHS  . metoprolol succinate  25 mg Oral Q breakfast   . metoprolol succinate  25 mg Oral Q supper  . metoprolol succinate  25 mg Oral QHS  . metoprolol succinate  50 mg Oral Q lunch  . multivitamin with minerals  1 tablet Oral Daily  . pantoprazole  40 mg Oral Daily   acetaminophen **OR** acetaminophen, guaiFENesin-dextromethorphan, ondansetron **OR** ondansetron (ZOFRAN) IV, phenol-menthol, sodium chloride  Assessment/ Plan:  77 y.o. female female with a PMHX of anxiety, osteoporosis, breast cancer, gastric ulcer, mitral valve prolapse, hypertension, was admitted on 01/28/2017 with acute syncope and found to have severe hyponatremia and hypokalemia  1.  Hyponatremia.  Baseline serum sodium 132 from November 2018.  She was on HCTZ as an outpatient.   -Serum sodium has improved significantly.  Currently up to 130.  Tolvaptan has been continued.  Follow serum sodium 1 additional day.  2.  Hypokalemia.  Potassium currently 3.4.  We will administer potassium chloride today.  3.  Hypertension.  Continue metoprolol.      LOS: [place], Najmo Pardue 1/4/20199:55 AM

## 2017-02-01 NOTE — Care Management Important Message (Signed)
Important Message  Patient Details  Name: Katie Harding MRN: [id number] Date of Birth: 01-29-1941   Medicare Important Message Given:  Yes    Beverly Sessions, RN 02/01/2017, 10:35 AM

## 2017-02-01 NOTE — Progress Notes (Signed)
[facility] [place] NAME: Katie Harding    MR#:  [id number]  DATE OF BIRTH:  08-30-1940  SUBJECTIVE:  Patient denies any complaints.  She is patiently waiting for sodium to get better.  REVIEW OF SYSTEMS:   Review of Systems  Constitutional: Negative for chills, fever and weight loss.  HENT: Negative for ear discharge, ear pain and nosebleeds.   Eyes: Negative for blurred vision, pain and discharge.  Respiratory: Negative for sputum production, shortness of breath, wheezing and stridor.   Cardiovascular: Negative for chest pain, palpitations, orthopnea and PND.  Gastrointestinal: Negative for abdominal pain, diarrhea, nausea and vomiting.  Genitourinary: Negative for frequency and urgency.  Musculoskeletal: Negative for back pain and joint pain.  Neurological: Negative for sensory change, speech change, focal weakness and weakness.  Psychiatric/Behavioral: Negative for depression and hallucinations. The patient is not nervous/anxious.    Tolerating Diet:yes Tolerating PT: HHPT  DRUG ALLERGIES:  No Known Allergies  VITALS:  Blood pressure 115/64, pulse 72, temperature 98.3 F (36.8 C), resp. rate 16, height 5\' 7"  (1.702 m), weight 46 kg (101 lb 6.4 oz), SpO2 94 %.  PHYSICAL EXAMINATION:   Physical Exam  GENERAL:  77 y.o.-year-old patient lying in the bed with no acute distress.  EYES: Pupils equal, round, reactive to light and accommodation. No scleral icterus. Extraocular muscles intact.  HEENT: Head atraumatic, normocephalic. Oropharynx and nasopharynx clear.  NECK:  Supple, no jugular venous distention. No thyroid enlargement, no tenderness.  LUNGS: Normal breath sounds bilaterally, no wheezing, rales, rhonchi. No use of accessory muscles of respiration.  CARDIOVASCULAR: S1, S2 normal. No murmurs, rubs, or gallops.  ABDOMEN: Soft, nontender, nondistended. Bowel sounds present. No organomegaly or mass.  EXTREMITIES: No  cyanosis, clubbing or edema b/l.    NEUROLOGIC: Cranial nerves II through XII are intact. No focal Motor or sensory deficits b/l.   PSYCHIATRIC:  patient is alert and oriented x 3.  SKIN: No obvious rash, lesion, or ulcer.   LABORATORY PANEL:  CBC Recent Labs  Lab 01/28/17 1544  WBC 6.1  HGB 12.4  HCT 35.8  PLT 221    Chemistries  Recent Labs  Lab 01/31/17 1427  02/01/17 0540  NA 117*   < > 130*  K 3.4*  --   --   CL 86*  --   --   CO2 24  --   --   GLUCOSE 116*  --   --   BUN 9  --   --   CREATININE 0.31*  --   --   CALCIUM 8.0*  --   --    < > = values in this interval not displayed.   Cardiac Enzymes No results for input(s): TROPONINI in the last 168 hours. RADIOLOGY:  No results found. ASSESSMENT AND PLAN:  77 y.o.femalewith a known history of anxiety, history of breast cancer, GERD comes to the emergency room after she had a syncopal episode at a beauty salon.  1. Hyponatremia: likely from chronic use of HCTZ Patient was advised not to continue HCTZ when she is discharged to home Sodium level still low, however slowly improving 118--119--121--116--115--Tolvaptan 01/31/17  x1 dose--127-- monitor BMP tomorrow Appreciate nephrology consultation  2. GERD continue PPI  3. Chronic anxiety continue Xanax as needed  4. Essential HTN: Continue  Metoprolol  5. Hypokalemia and hypoMg:  repleted  6. Cough: Chest x-ray without evidence of pneumonia Flonase for postnasal drip  7. Severe Malnutritionrelated to  catabolic illness(h/o breast cancer, gastric ulcers and poor po intake )as evidenced by severe muscle depletion, severe fat depletion Cont with dietary RECS.  Case discussed with Care Management/Social Worker. Management plans discussed with the patient, family and they are in agreement.  CODE STATUS: DNR  DVT Prophylaxis: lovenox  TOTAL TIME TAKING CARE OF THIS PATIENT: *25* minutes.  >50% time spent on counselling and coordination of  care  POSSIBLE D/C IN *1-2* DAYS, DEPENDING ON CLINICAL CONDITION.  Note: This dictation was prepared with Dragon dictation along with smaller phrase technology. Any transcriptional errors that result from this process are unintentional.  Fritzi Mandes M.D on 02/01/2017 at 11:59 AM  Between 7am to 6pm - Pager - [phone]  After 6pm go to [email or web address] - password [email or web address] [facility] [place] [facility] [facility]  Office  [phone]  CC: Primary care physician; Rusty Aus, MDPatient ID: Katie Harding, female   DOB: 1940/07/14, 77 y.o.   MRN: [id number]

## 2017-02-02 LAB — SODIUM: SODIUM: 128 mmol/L — AB (ref 135–145)

## 2017-02-02 MED ORDER — POTASSIUM CHLORIDE CRYS ER 20 MEQ PO TBCR
20.0000 meq | EXTENDED_RELEASE_TABLET | Freq: Once | ORAL | Status: AC
  Administered 2017-02-02: 20 meq via ORAL
  Filled 2017-02-02: qty 1

## 2017-02-02 NOTE — Progress Notes (Signed)
Katie Harding  A and O x 4. VSS. Pt tolerating diet well. No complaints of pain or nausea. IV removed intact, no new prescriptions given. Pt voiced understanding of discharge instructions with no further questions. Pt discharged via wheelchair with nurse aide.  Lynann Bologna MSN, RN-BC  Allergies as of 02/02/2017   No Known Allergies     Medication List    TAKE these medications   acetaminophen 325 MG tablet Commonly known as:  TYLENOL Take 650 mg by mouth every 4 (four) hours as needed. For pain / increased temp Max dose for 24 hrs is 3000 mg from all sources of Apap/tyenol   ALPRAZolam 0.25 MG tablet Commonly known as:  XANAX Take 0.5 tablets (0.125 mg total) by mouth 4 (four) times daily.   calcium citrate-vitamin D 315-200 MG-UNIT tablet Commonly known as:  CITRACAL+D Take 2 tablets by mouth 2 (two) times daily.   Cholecalciferol 1000 units capsule Take 2,000 Units by mouth daily. 2 caps   co-enzyme Q-10 30 MG capsule Take 30 mg by mouth daily.   docusate sodium 100 MG capsule Commonly known as:  COLACE Take 100 mg by mouth 2 (two) times daily.   ENSURE ENLIVE PO Take 1 Bottle by mouth 2 (two) times daily between meals.   feeding supplement (PRO-STAT SUGAR FREE 64) Liqd Take 30 mLs by mouth 2 (two) times daily between meals.   magnesium oxide 400 MG tablet Commonly known as:  MAG-OX Take 400 mg by mouth 2 (two) times daily.   metoCLOPramide 5 MG tablet Commonly known as:  REGLAN Take 5 mg by mouth at bedtime.   multivitamin tablet Take 1 tablet by mouth daily.   omeprazole 40 MG capsule Commonly known as:  PRILOSEC Take 40 mg by mouth 2 (two) times daily.   polyethylene glycol packet Commonly known as:  MIRALAX / GLYCOLAX Take 17 g by mouth daily.   senna 8.6 MG tablet Commonly known as:  SENOKOT Take 2 tablets by mouth 2 (two) times daily.   TOPROL XL 50 MG 24 hr tablet Generic drug:  metoprolol succinate Take 50 mg by mouth See admin instructions.  Take 1/2 tablet (25 mg) q a.m., 1 whole tablet (50 mg)  at lunch, 1/2 tablet (25mg )  with dinner, and 1/2 tablet (25 mg) at bedtime       Vitals:   02/02/17 0748 02/02/17 1159  BP: 120/65 109/67  Pulse: 65 69  Resp:    Temp:    SpO2:

## 2017-02-02 NOTE — Progress Notes (Signed)
[facility]  ROUNDING NOTE   Subjective:  Patient seen at bedside. Serum sodium up to 128. Feeling back to her baseline.  Objective:  Vital signs in last 24 hours:  Temp:  [97.4 F (36.3 C)-98.2 F (36.8 C)] 97.4 F (36.3 C) (01/05 0425) Pulse Rate:  [65-75] 65 (01/05 0748) Resp:  [18] 18 (01/05 0425) BP: (101-120)/(50-86) 120/65 (01/05 0748) SpO2:  [93 %-98 %] 96 % (01/05 0425)  Weight change:  Filed Weights   01/28/17 1139 01/28/17 1501  Weight: 47.2 kg (104 lb) 46 kg (101 lb 6.4 oz)    Intake/Output: I/O last 3 completed shifts: In: 840 [P.O.:840] Out: 4151 [Urine:4150; Stool:1]   Intake/Output this shift:  Total I/O In: 240 [P.O.:240] Out: 0   Physical Exam: General: No acute distress  Head: Normocephalic, atraumatic. Moist oral mucosal membranes  Eyes: Anicteric  Neck: Supple, trachea midline  Lungs:  Clear to auscultation, normal effort  Heart: S1S2 no rubs  Abdomen:  Soft, nontender, bowel sounds present  Extremities: No peripheral edema.  Neurologic: Awake, alert, following commands  Skin: No lesions       Basic Metabolic Panel: Recent Labs  Lab 01/29/17 1500 01/30/17 0346 01/31/17 0433 01/31/17 0808 01/31/17 1427 01/31/17 2224 02/01/17 0540 02/02/17 0741  NA 119* 121* 116* 115* 117* 127* 130* 128*  K 4.1 4.4 3.3* 3.8 3.4*  --   --   --   CL 88* 91* 86* 84* 86*  --   --   --   CO2 26 24 23 24 24   --   --   --   GLUCOSE 107* 99 103* 90 116*  --   --   --   BUN 11 10 9 9 9   --   --   --   CREATININE 0.41* 0.36* <0.30* 0.32* 0.31*  --   --   --   CALCIUM 8.3* 8.3* 7.8* 7.9* 8.0*  --   --   --     Liver Function Tests: No results for input(s): AST, ALT, ALKPHOS, BILITOT, PROT, ALBUMIN in the last 168 hours. No results for input(s): LIPASE, AMYLASE in the last 168 hours. No results for input(s): AMMONIA in the last 168 hours.  CBC: Recent Labs  Lab 01/28/17 1136 01/28/17 1544  WBC 5.1 6.1  HGB 13.1 12.4  HCT 37.3 35.8   MCV 93.4 94.2  PLT 239 221    Cardiac Enzymes: No results for input(s): CKTOTAL, CKMB, CKMBINDEX, TROPONINI in the last 168 hours.  BNP: Invalid input(s): POCBNP  CBG: Recent Labs  Lab 01/28/17 1146  GLUCAP 102*    Microbiology: Results for orders placed or performed during the hospital encounter of 08/31/16  Urine Culture     Status: Abnormal   Collection Time: 08/31/16  1:00 PM  Result Value Ref Range Status   Specimen Description URINE, RANDOM  Final   Special Requests NONE  Final   Culture >=100,000 COLONIES/mL CITROBACTER FREUNDII (A)  Final   Report Status 09/02/2016 FINAL  Final   Organism ID, Bacteria CITROBACTER FREUNDII (A)  Final      Susceptibility   Citrobacter freundii - MIC*    CEFAZOLIN >=64 RESISTANT Resistant     CEFTRIAXONE <=1 SENSITIVE Sensitive     CIPROFLOXACIN <=0.25 SENSITIVE Sensitive     GENTAMICIN <=1 SENSITIVE Sensitive     IMIPENEM <=0.25 SENSITIVE Sensitive     NITROFURANTOIN <=16 SENSITIVE Sensitive     TRIMETH/SULFA <=20 SENSITIVE Sensitive  PIP/TAZO <=4 SENSITIVE Sensitive     * >=100,000 COLONIES/mL CITROBACTER FREUNDII    Coagulation Studies: No results for input(s): LABPROT, INR in the last 72 hours.  Urinalysis: No results for input(s): COLORURINE, LABSPEC, PHURINE, GLUCOSEU, HGBUR, BILIRUBINUR, KETONESUR, PROTEINUR, UROBILINOGEN, NITRITE, LEUKOCYTESUR in the last 72 hours.  Invalid input(s): APPERANCEUR    Imaging: No results found.   Medications:    . ALPRAZolam  0.125 mg Oral QID  . calcium citrate-vitamin D  1 tablet Oral BID  . cholecalciferol  2,000 Units Oral Daily  . enoxaparin (LOVENOX) injection  40 mg Subcutaneous Q24H  . feeding supplement (ENSURE ENLIVE)  1 Bottle Oral BID BM  . feeding supplement (PRO-STAT SUGAR FREE 64)  30 mL Oral BID BM  . fluticasone  1 spray Each Nare Daily  . loratadine  10 mg Oral Daily  . magnesium oxide  400 mg Oral BID  . metoCLOPramide  5 mg Oral QHS  . metoprolol  succinate  25 mg Oral Q breakfast  . metoprolol succinate  25 mg Oral Q supper  . metoprolol succinate  25 mg Oral QHS  . metoprolol succinate  50 mg Oral Q lunch  . multivitamin with minerals  1 tablet Oral Daily  . pantoprazole  40 mg Oral Daily   acetaminophen **OR** acetaminophen, guaiFENesin-dextromethorphan, ondansetron **OR** ondansetron (ZOFRAN) IV, phenol-menthol, sodium chloride  Assessment/ Plan:  77 y.o. female female with a PMHX of anxiety, osteoporosis, breast cancer, gastric ulcer, mitral valve prolapse, hypertension, was admitted on 01/28/2017 with acute syncope and found to have severe hyponatremia and hypokalemia  1.  Hyponatremia.  Baseline serum sodium 132 from November 2018.  She was on HCTZ as an outpatient.   - Serum sodium currently 128, pt off tolvaptan now.  Advised pt to limit water intake to 1500cc per day for now.  Will need follow up sodium with her PCP.  Would not restart HCTZ.    2.  Hypokalemia.  K currently 3.4, will give oral repletion.    3.  Hypertension.  Blood pressure currently 120/65, continue metoprolol.       LOS: 5 Reather Steller 1/5/201911:21 AM

## 2017-02-02 NOTE — Discharge Summary (Signed)
[facility] [place] [facility] [place] NAME: Katie Harding    MR#:  [id number]  DATE OF BIRTH:  April 30, 1940  DATE OF ADMISSION:  01/28/2017 ADMITTING PHYSICIAN: Fritzi Mandes, MD  DATE OF DISCHARGE: 02/02/2017  1:35 PM  PRIMARY CARE PHYSICIAN: Rusty Aus, MD    ADMISSION DIAGNOSIS:  Syncope and collapse [R55] Hyponatremia [E87.1]  DISCHARGE DIAGNOSIS:  Active Problems:   Hyponatremia   Protein-calorie malnutrition, severe   SECONDARY DIAGNOSIS:   Past Medical History:  Diagnosis Date  . #[id number] 2006  . Anxiety   . Breast cancer [facility]) 2006   right breast lumpectomy with radiation  . Colon polyp    adenoma  . GERD (gastroesophageal reflux disease) 04/04/2015  . Heart disease   . History of gastric ulcer 04/2001  . History of right breast cancer    post surgery/ XRT  . MVP (mitral valve prolapse)    dental prophylaxis  . Osteoporosis   . Osteoporosis, post-menopausal   . Surgical menopause     HOSPITAL COURSE:   1.  Hyponatremia.  Likely secondary to hydrochlorothiazide use.  The patient was given 1 dose of tolvaptan and monitor during the hospital course.  Sodium was 118 when she came in.  It dipped down to 116 on 01/31/2017.  Came up to 128 upon discharge home. 2.  Essential hypertension on metoprolol  3.  Hypomagnesemia and hypo-kalemia replaced during the hospital course.   4.  GERD on PPI 5.  Chronic anxiety on Xanax 6.  Home health set up 7.  Severe malnutrition  DISCHARGE CONDITIONS:   Follow-up PMD 1 week  CONSULTS OBTAINED:  Treatment Team:  Murlean Iba, MD  DRUG ALLERGIES:  No Known Allergies  DISCHARGE MEDICATIONS:   Allergies as of 02/02/2017   No Known Allergies     Medication List    TAKE these medications   acetaminophen 325 MG tablet Commonly known as:  TYLENOL Take 650 mg by mouth every 4 (four) hours as needed. For pain / increased temp Max dose for 24 hrs is 3000 mg from all sources of Apap/tyenol    ALPRAZolam 0.25 MG tablet Commonly known as:  XANAX Take 0.5 tablets (0.125 mg total) by mouth 4 (four) times daily.   calcium citrate-vitamin D 315-200 MG-UNIT tablet Commonly known as:  CITRACAL+D Take 2 tablets by mouth 2 (two) times daily.   Cholecalciferol 1000 units capsule Take 2,000 Units by mouth daily. 2 caps   co-enzyme Q-10 30 MG capsule Take 30 mg by mouth daily.   docusate sodium 100 MG capsule Commonly known as:  COLACE Take 100 mg by mouth 2 (two) times daily.   ENSURE ENLIVE PO Take 1 Bottle by mouth 2 (two) times daily between meals.   feeding supplement (PRO-STAT SUGAR FREE 64) Liqd Take 30 mLs by mouth 2 (two) times daily between meals.   magnesium oxide 400 MG tablet Commonly known as:  MAG-OX Take 400 mg by mouth 2 (two) times daily.   metoCLOPramide 5 MG tablet Commonly known as:  REGLAN Take 5 mg by mouth at bedtime.   multivitamin tablet Take 1 tablet by mouth daily.   omeprazole 40 MG capsule Commonly known as:  PRILOSEC Take 40 mg by mouth 2 (two) times daily.   polyethylene glycol packet Commonly known as:  MIRALAX / GLYCOLAX Take 17 g by mouth daily.   senna 8.6 MG tablet Commonly known as:  SENOKOT Take 2 tablets by mouth 2 (two) times  daily.   TOPROL XL 50 MG 24 hr tablet Generic drug:  metoprolol succinate Take 50 mg by mouth See admin instructions. Take 1/2 tablet (25 mg) q a.m., 1 whole tablet (50 mg)  at lunch, 1/2 tablet (25mg )  with dinner, and 1/2 tablet (25 mg) at bedtime        DISCHARGE INSTRUCTIONS:   Follow-up PMD 1 week  If you experience worsening of your admission symptoms, develop shortness of breath, life threatening emergency, suicidal or homicidal thoughts you must seek medical attention immediately by calling 911 or calling your MD immediately  if symptoms less severe.  You Must read complete instructions/literature along with all the possible adverse reactions/side effects for all the Medicines you take  and that have been prescribed to you. Take any new Medicines after you have completely understood and accept all the possible adverse reactions/side effects.   Please note  You were cared for by a hospitalist during your hospital stay. If you have any questions about your discharge medications or the care you received while you were in the hospital after you are discharged, you can call the unit and asked to speak with the hospitalist on call if the hospitalist that took care of you is not available. Once you are discharged, your primary care physician will handle any further medical issues. Please note that NO REFILLS for any discharge medications will be authorized once you are discharged, as it is imperative that you return to your primary care physician (or establish a relationship with a primary care physician if you do not have one) for your aftercare needs so that they can reassess your need for medications and monitor your lab values.    Today   CHIEF COMPLAINT:   Chief Complaint  Patient presents with  . Loss of Consciousness    HISTORY OF PRESENT ILLNESS:  Katie Harding  is a 77 y.o. female brought in with altered mental status and found to have a low sodium   VITAL SIGNS:  Blood pressure 109/67, pulse 69, temperature (!) 97.4 F (36.3 C), temperature source Oral, resp. rate 18, height 5\' 7"  (1.702 m), weight 46 kg (101 lb 6.4 oz), SpO2 96 %.   PHYSICAL EXAMINATION:  GENERAL:  77 y.o.-year-old patient lying in the bed with no acute distress.  EYES: Pupils equal, round, reactive to light and accommodation. No scleral icterus. Extraocular muscles intact.  HEENT: Head atraumatic, normocephalic. Oropharynx and nasopharynx clear.  NECK:  Supple, no jugular venous distention. No thyroid enlargement, no tenderness.  LUNGS: Normal breath sounds bilaterally, no wheezing, rales,rhonchi or crepitation. No use of accessory muscles of respiration.  CARDIOVASCULAR: S1, S2 normal. No  murmurs, rubs, or gallops.  ABDOMEN: Soft, non-tender, non-distended. Bowel sounds present. No organomegaly or mass.  EXTREMITIES: No pedal edema, cyanosis, or clubbing.  NEUROLOGIC: Cranial nerves II through XII are intact. Muscle strength 5/5 in all extremities. Sensation intact. Gait not checked.  PSYCHIATRIC: The patient is alert and oriented x 3.  SKIN: No obvious rash, lesion, or ulcer.   DATA REVIEW:   CBC Recent Labs  Lab 01/28/17 1544  WBC 6.1  HGB 12.4  HCT 35.8  PLT 221    Chemistries  Recent Labs  Lab 01/31/17 1427  02/02/17 0741  NA 117*   < > 128*  K 3.4*  --   --   CL 86*  --   --   CO2 24  --   --   GLUCOSE 116*  --   --  BUN 9  --   --   CREATININE 0.31*  --   --   CALCIUM 8.0*  --   --    < > = values in this interval not displayed.      Management plans discussed with the patient, and she is in agreement.  CODE STATUS:     Code Status Orders  (From admission, onward)        Start     Ordered   01/28/17 1340  Do not attempt resuscitation (DNR)  Continuous    Question Answer Comment  In the event of cardiac or respiratory ARREST Do not call a "code blue"   In the event of cardiac or respiratory ARREST Do not perform Intubation, CPR, defibrillation or ACLS   In the event of cardiac or respiratory ARREST Use medication by any route, position, wound care, and other measures to relive pain and suffering. May use oxygen, suction and manual treatment of airway obstruction as needed for comfort.   Comments nurse may pronounce      01/28/17 1339    Code Status History    Date Active Date Inactive Code Status Order ID Comments User Context   08/20/2016 13:26 08/22/2016 16:48 DNR [id number]  Loletha Grayer, MD ED    Advance Directive Documentation     Most Recent Value  Type of Advance Directive  Healthcare Power of Attorney  Pre-existing out of facility DNR order (yellow form or pink MOST form)  No data  "MOST" Form in Place?  No data       TOTAL TIME TAKING CARE OF THIS PATIENT: 35 minutes.    Loletha Grayer M.D on 02/02/2017 at 4:07 PM  Between 7am to 6pm - Pager - [phone]  After 6pm go to [email or web address] - password [email or web address]  [facility] Office  [phone]  CC: Primary care physician; Rusty Aus, MD

## 2017-02-02 NOTE — Discharge Instructions (Signed)
Do not take Hydrochlorothiazide Fluid restriction 1242ml

## 2017-02-02 NOTE — Care Management Note (Signed)
Case Management Note  Patient Details  Name: SEVEN MARENGO MRN: [id number] Date of Birth: 07-Apr-1940  Subjective/Objective:      Referral for HH-PT called to [facility] [place]. Ms Leggette already has DME at home.               Action/Plan:   Expected Discharge Date:  02/02/17               Expected Discharge Plan:  [place]  In-House Referral:     Discharge planning Services  CM Consult  Post Acute Care Choice:  Home Health Choice offered to:  Patient  DME Arranged:    DME Agency:     HH Arranged:  PT [place]:  [facility] [employer]  Status of Service:  Completed, signed off  If discussed at [employer], dates discussed:    Additional Comments:  Chakia Counts A, RN 02/02/2017, 10:20 AM

## 2017-03-07 ENCOUNTER — Other Ambulatory Visit: Payer: Self-pay | Admitting: Internal Medicine

## 2017-03-07 DIAGNOSIS — Z1231 Encounter for screening mammogram for malignant neoplasm of breast: Secondary | ICD-10-CM

## 2017-03-14 ENCOUNTER — Other Ambulatory Visit: Payer: Self-pay | Admitting: Internal Medicine

## 2017-03-14 DIAGNOSIS — M5116 Intervertebral disc disorders with radiculopathy, lumbar region: Secondary | ICD-10-CM

## 2017-03-16 ENCOUNTER — Ambulatory Visit
Admission: RE | Admit: 2017-03-16 | Discharge: 2017-03-16 | Disposition: A | Discharge status: Home / Self Care | Payer: Medicare Other | Source: Ambulatory Visit | Attending: Internal Medicine | Admitting: Internal Medicine

## 2017-03-16 DIAGNOSIS — M5116 Intervertebral disc disorders with radiculopathy, lumbar region: Secondary | ICD-10-CM | POA: Diagnosis present

## 2017-03-16 DIAGNOSIS — M5136 Other intervertebral disc degeneration, lumbar region: Secondary | ICD-10-CM | POA: Insufficient documentation

## 2017-03-16 DIAGNOSIS — S3210XS Unspecified fracture of sacrum, sequela: Secondary | ICD-10-CM | POA: Insufficient documentation

## 2017-04-01 ENCOUNTER — Ambulatory Visit
Admission: RE | Admit: 2017-04-01 | Discharge: 2017-04-01 | Disposition: A | Discharge status: Home / Self Care | Payer: Medicare Other | Source: Ambulatory Visit | Attending: Internal Medicine | Admitting: Internal Medicine

## 2017-04-01 DIAGNOSIS — Z1231 Encounter for screening mammogram for malignant neoplasm of breast: Secondary | ICD-10-CM

## 2017-04-01 HISTORY — DX: Personal history of irradiation: Z92.3

## 2018-02-27 ENCOUNTER — Other Ambulatory Visit: Payer: Self-pay | Admitting: Internal Medicine

## 2018-02-27 DIAGNOSIS — Z1231 Encounter for screening mammogram for malignant neoplasm of breast: Secondary | ICD-10-CM

## 2018-04-03 ENCOUNTER — Ambulatory Visit
Admission: RE | Admit: 2018-04-03 | Discharge: 2018-04-03 | Disposition: A | Discharge status: Home / Self Care | Payer: Medicare Other | Source: Ambulatory Visit | Attending: Internal Medicine | Admitting: Internal Medicine

## 2018-04-03 DIAGNOSIS — Z1231 Encounter for screening mammogram for malignant neoplasm of breast: Secondary | ICD-10-CM | POA: Insufficient documentation

## 2018-04-08 ENCOUNTER — Other Ambulatory Visit: Payer: Self-pay | Admitting: Internal Medicine

## 2018-04-08 DIAGNOSIS — R921 Mammographic calcification found on diagnostic imaging of breast: Secondary | ICD-10-CM

## 2018-04-08 DIAGNOSIS — R928 Other abnormal and inconclusive findings on diagnostic imaging of breast: Secondary | ICD-10-CM

## 2018-04-14 ENCOUNTER — Ambulatory Visit
Admission: RE | Admit: 2018-04-14 | Discharge: 2018-04-14 | Disposition: A | Discharge status: Home / Self Care | Payer: Medicare Other | Source: Ambulatory Visit | Attending: Internal Medicine | Admitting: Internal Medicine

## 2018-04-14 ENCOUNTER — Other Ambulatory Visit: Payer: Self-pay

## 2018-04-14 DIAGNOSIS — R928 Other abnormal and inconclusive findings on diagnostic imaging of breast: Secondary | ICD-10-CM | POA: Diagnosis not present

## 2018-04-14 DIAGNOSIS — R921 Mammographic calcification found on diagnostic imaging of breast: Secondary | ICD-10-CM | POA: Insufficient documentation

## 2018-04-15 ENCOUNTER — Other Ambulatory Visit: Payer: Self-pay | Admitting: Internal Medicine

## 2018-04-15 DIAGNOSIS — R921 Mammographic calcification found on diagnostic imaging of breast: Secondary | ICD-10-CM

## 2018-07-08 DIAGNOSIS — I480 Paroxysmal atrial fibrillation: Secondary | ICD-10-CM | POA: Diagnosis present

## 2018-08-11 ENCOUNTER — Other Ambulatory Visit: Payer: Self-pay | Admitting: Internal Medicine

## 2018-08-11 DIAGNOSIS — M4807 Spinal stenosis, lumbosacral region: Secondary | ICD-10-CM

## 2018-08-26 ENCOUNTER — Other Ambulatory Visit: Payer: Self-pay

## 2018-08-26 ENCOUNTER — Ambulatory Visit
Admission: RE | Admit: 2018-08-26 | Discharge: 2018-08-26 | Disposition: A | Discharge status: Home / Self Care | Payer: Medicare Other | Source: Ambulatory Visit | Attending: Internal Medicine | Admitting: Internal Medicine

## 2018-08-26 DIAGNOSIS — M4807 Spinal stenosis, lumbosacral region: Secondary | ICD-10-CM | POA: Insufficient documentation

## 2018-11-12 ENCOUNTER — Ambulatory Visit
Admission: RE | Admit: 2018-11-12 | Discharge: 2018-11-12 | Disposition: A | Discharge status: Home / Self Care | Payer: Medicare Other | Source: Ambulatory Visit | Attending: Internal Medicine | Admitting: Internal Medicine

## 2018-11-12 DIAGNOSIS — R921 Mammographic calcification found on diagnostic imaging of breast: Secondary | ICD-10-CM

## 2018-11-13 ENCOUNTER — Other Ambulatory Visit: Payer: Self-pay | Admitting: Internal Medicine

## 2018-11-13 DIAGNOSIS — R921 Mammographic calcification found on diagnostic imaging of breast: Secondary | ICD-10-CM

## 2018-12-04 ENCOUNTER — Other Ambulatory Visit: Payer: Self-pay | Admitting: Neurosurgery

## 2018-12-04 ENCOUNTER — Other Ambulatory Visit (HOSPITAL_COMMUNITY): Payer: Self-pay | Admitting: Neurosurgery

## 2018-12-04 DIAGNOSIS — S3210XA Unspecified fracture of sacrum, initial encounter for closed fracture: Secondary | ICD-10-CM

## 2018-12-05 ENCOUNTER — Other Ambulatory Visit (HOSPITAL_COMMUNITY): Payer: Self-pay | Admitting: Neurosurgery

## 2018-12-05 ENCOUNTER — Other Ambulatory Visit: Payer: Self-pay | Admitting: Neurosurgery

## 2018-12-05 DIAGNOSIS — M4310 Spondylolisthesis, site unspecified: Secondary | ICD-10-CM

## 2018-12-10 ENCOUNTER — Other Ambulatory Visit: Payer: Self-pay

## 2018-12-10 ENCOUNTER — Ambulatory Visit
Admission: RE | Admit: 2018-12-10 | Discharge: 2018-12-10 | Disposition: A | Discharge status: Home / Self Care | Payer: Medicare Other | Source: Ambulatory Visit | Attending: Neurosurgery | Admitting: Neurosurgery

## 2018-12-10 DIAGNOSIS — S3210XA Unspecified fracture of sacrum, initial encounter for closed fracture: Secondary | ICD-10-CM | POA: Insufficient documentation

## 2018-12-11 ENCOUNTER — Encounter (HOSPITAL_COMMUNITY)
Admission: RE | Admit: 2018-12-11 | Discharge: 2018-12-11 | Disposition: A | Discharge status: Home / Self Care | Payer: Medicare Other | Source: Ambulatory Visit | Attending: Neurosurgery | Admitting: Neurosurgery

## 2018-12-11 DIAGNOSIS — M4310 Spondylolisthesis, site unspecified: Secondary | ICD-10-CM

## 2018-12-11 MED ORDER — TECHNETIUM TC 99M MEDRONATE IV KIT
21.5000 | PACK | Freq: Once | INTRAVENOUS | Status: AC | PRN
  Administered 2018-12-11: 21.5 via INTRAVENOUS

## 2018-12-15 ENCOUNTER — Ambulatory Visit: Payer: Medicare Other

## 2019-03-02 ENCOUNTER — Other Ambulatory Visit: Payer: Self-pay | Admitting: Internal Medicine

## 2019-03-02 ENCOUNTER — Ambulatory Visit: Admission: RE | Admit: 2019-03-02 | Payer: Medicare PPO | Source: Ambulatory Visit

## 2019-03-02 DIAGNOSIS — R634 Abnormal weight loss: Secondary | ICD-10-CM

## 2019-03-02 DIAGNOSIS — R1084 Generalized abdominal pain: Secondary | ICD-10-CM

## 2019-03-09 ENCOUNTER — Other Ambulatory Visit: Payer: Self-pay | Admitting: Internal Medicine

## 2019-03-09 ENCOUNTER — Other Ambulatory Visit: Payer: Self-pay | Admitting: Student

## 2019-03-09 DIAGNOSIS — K219 Gastro-esophageal reflux disease without esophagitis: Secondary | ICD-10-CM

## 2019-03-09 DIAGNOSIS — R1084 Generalized abdominal pain: Secondary | ICD-10-CM

## 2019-03-09 DIAGNOSIS — R634 Abnormal weight loss: Secondary | ICD-10-CM

## 2019-03-09 DIAGNOSIS — K5909 Other constipation: Secondary | ICD-10-CM

## 2019-03-13 ENCOUNTER — Ambulatory Visit: Payer: Medicare PPO

## 2019-03-18 ENCOUNTER — Ambulatory Visit
Admission: RE | Admit: 2019-03-18 | Discharge: 2019-03-18 | Disposition: A | Discharge status: Home / Self Care | Payer: Medicare PPO | Source: Ambulatory Visit | Attending: Student | Admitting: Student

## 2019-03-18 ENCOUNTER — Encounter (INDEPENDENT_AMBULATORY_CARE_PROVIDER_SITE_OTHER): Payer: Self-pay

## 2019-03-18 ENCOUNTER — Other Ambulatory Visit: Payer: Self-pay

## 2019-03-18 DIAGNOSIS — R634 Abnormal weight loss: Secondary | ICD-10-CM | POA: Insufficient documentation

## 2019-03-18 DIAGNOSIS — R1084 Generalized abdominal pain: Secondary | ICD-10-CM | POA: Diagnosis not present

## 2019-03-18 DIAGNOSIS — K219 Gastro-esophageal reflux disease without esophagitis: Secondary | ICD-10-CM | POA: Insufficient documentation

## 2019-03-18 DIAGNOSIS — K5909 Other constipation: Secondary | ICD-10-CM | POA: Insufficient documentation

## 2019-04-30 DIAGNOSIS — K5900 Constipation, unspecified: Secondary | ICD-10-CM

## 2019-04-30 HISTORY — DX: Constipation, unspecified: K59.00

## 2019-06-01 ENCOUNTER — Other Ambulatory Visit: Payer: Self-pay | Admitting: Internal Medicine

## 2019-06-01 ENCOUNTER — Other Ambulatory Visit: Payer: Self-pay

## 2019-06-01 ENCOUNTER — Ambulatory Visit
Admission: RE | Admit: 2019-06-01 | Discharge: 2019-06-01 | Disposition: A | Discharge status: Home / Self Care | Payer: Medicare PPO | Source: Ambulatory Visit | Attending: Internal Medicine | Admitting: Internal Medicine

## 2019-06-01 DIAGNOSIS — S32020A Wedge compression fracture of second lumbar vertebra, initial encounter for closed fracture: Secondary | ICD-10-CM | POA: Diagnosis present

## 2019-06-05 ENCOUNTER — Other Ambulatory Visit
Admission: RE | Admit: 2019-06-05 | Discharge: 2019-06-05 | Disposition: A | Discharge status: Home / Self Care | Payer: Medicare PPO | Source: Ambulatory Visit | Attending: Orthopedic Surgery | Admitting: Orthopedic Surgery

## 2019-06-05 ENCOUNTER — Encounter
Admission: RE | Admit: 2019-06-05 | Discharge: 2019-06-05 | Disposition: A | Discharge status: Home / Self Care | Payer: Medicare PPO | Source: Ambulatory Visit | Attending: Orthopedic Surgery | Admitting: Orthopedic Surgery

## 2019-06-05 ENCOUNTER — Other Ambulatory Visit: Payer: Self-pay

## 2019-06-05 ENCOUNTER — Other Ambulatory Visit: Payer: Self-pay | Admitting: Orthopedic Surgery

## 2019-06-05 DIAGNOSIS — Z20822 Contact with and (suspected) exposure to covid-19: Secondary | ICD-10-CM | POA: Insufficient documentation

## 2019-06-05 DIAGNOSIS — Z01812 Encounter for preprocedural laboratory examination: Secondary | ICD-10-CM | POA: Diagnosis present

## 2019-06-05 HISTORY — DX: Other specified postprocedural states: R11.2

## 2019-06-05 HISTORY — DX: Age-related osteoporosis with current pathological fracture, vertebra(e), initial encounter for fracture: M80.08XA

## 2019-06-05 HISTORY — DX: Other specified postprocedural states: Z98.890

## 2019-06-05 HISTORY — DX: Other complications of anesthesia, initial encounter: T88.59XA

## 2019-06-05 HISTORY — DX: Dyspnea, unspecified: R06.00

## 2019-06-05 HISTORY — DX: Unspecified osteoarthritis, unspecified site: M19.90

## 2019-06-05 HISTORY — DX: Scoliosis, unspecified: M41.9

## 2019-06-05 HISTORY — DX: Cardiac arrhythmia, unspecified: I49.9

## 2019-06-05 LAB — SARS CORONAVIRUS 2 (TAT 6-24 HRS): SARS Coronavirus 2: NEGATIVE

## 2019-06-05 NOTE — Patient Instructions (Signed)
INSTRUCTIONS FOR SURGERY     Your surgery is scheduled for:   Monday, MAY 10TH  PLEASE ARRIVE AT 11am at the [place].  REPORT TO THE SECOND FLOOR.     Do NOT stop on the first floor to register.    REMEMBER: Instructions that are not followed completely may result in serious medical risk,  up to and including death, or upon the discretion of your surgeon and anesthesiologist,            your surgery may need to be rescheduled.  __X__ 1. Do not eat food after midnight the night before your procedure.                    No gum, candy, lozenger, tic tacs, tums or hard candies.                  ABSOLUTELY NOTHING SOLID IN YOUR MOUTH AFTER MIDNIGHT                    You may drink unlimited clear liquids up to 2 hours before you are scheduled to arrive for surgery.                   Do not drink anything within those 2 hours unless you need to take medicine, then take the                   smallest amount you need.  Clear liquids include:  water, apple juice without pulp,                   any flavor Gatorade, Black coffee, black tea.  Sugar may be added but no dairy/ honey /lemon.                        Broth and jello is not considered a clear liquid.  __x__  2. On the morning of surgery, please brush your teeth with toothpaste and water. You may rinse with                  mouthwash if you wish but DO NOT SWALLOW TOOTHPASTE OR MOUTHWASH  __X___3. NO alcohol for 24 hours before or after surgery.  __x___ 4.  Do NOT smoke or use e-cigarettes for 24 HOURS PRIOR TO SURGERY.                      DO NOT Use any chewable tobacco products for at least 6 hours prior to surgery.  __x___ 5. If you start any new medication after this appointment and prior to surgery, please                   Bring it with you on the day of surgery.  ___x__ 6. Notify your doctor if there is any change in your medical condition, such as fever,       infection, vomitting, diarrhea or any open sores.  __x___ 7.  USE ANTIBACTERIAL SOAP as instructed, the night before surgery and the day of surgery.  Once you have washed with this soap, do NOT use any of the following: Powders, perfumes                    or lotions. Please do not wear make up, hairpins, clips or nail polish. You MAY wear deodorant.                   Men may shave their face and neck.  Women need to shave 48 hours prior to surgery.                   DO NOT wear ANY jewelry on the day of surgery. If there are rings that are too tight to                    remove easily, please address this prior to the surgery day. Piercings need to be removed.                                                                     NO METAL ON YOUR BODY.                    Do NOT bring any valuables.  If you came to Pre-Admit testing then you will not need license,                     insurance card or credit card.  If you will be staying overnight, please either leave your things in                     the car or have your family be responsible for these items.                     [facility] IS NOT RESPONSIBLE FOR BELONGINGS OR VALUABLES.  ___X__ 8. DO NOT wear contact lenses on surgery day.  You may not have dentures,                     Hearing aides, contacts or glasses in the operating room. These items can be                    Placed in the Recovery Room to receive immediately after surgery.  __x___ 9. IF YOU ARE SCHEDULED TO GO HOME ON THE SAME DAY, YOU MUST                   Have someone to drive you home and to stay with you  for the first 24 hours.                    Have an arrangement prior to arriving on surgery day.  ___x__ 10. Take the following medications on the morning of surgery with a sip of water:                              1. XANAX                     2. PROPANOLOL / inderal  3. PRILOSEC                     4.                       _____ 11.  Follow any instructions provided to you by your surgeon.                        Such as enema, clear liquid bowel prep  __X__  12. STOP ALL ASPIRIN PRODUCTS TODAY, 06/05/19.                       THIS INCLUDES BC POWDERS / GOODIES POWDER  __x___ 13. STOP Anti-inflammatories as of:  TODAY, 06/05/19                      This includes IBUPROFEN / MOTRIN / ADVIL / ALEVE/ NAPROXYN                    YOU MAY TAKE TYLENOL ANY TIME PRIOR TO SURGERY.  __X___ 14.  Stop supplements until after surgery.         __X____18.  Wear clean and comfortable clothing to the hospital.  CONTINUE YOUR BOWEL REGIME WITH THE 3 NEW PRODUCTS.   DO NOT TAKE ON THE MORNING OF SURGERY.

## 2019-06-05 NOTE — Pre-Procedure Instructions (Signed)
2020 December ECG 12-lead12/03/2018 [place] Component Name Value Ref Range  Vent Rate (bpm) 77   PR Interval (msec) 106   QRS Interval (msec) 84   QT Interval (msec) 370   QTc (msec) 418   Other Result Information  This result has an attachment that is not available.  Result Narrative  Sinus rhythm with sinus arrhythmia with short PR with occasional premature ventricular complexes Rightward axis Abnormal ECG When compared with ECG of 15-Dec-2018 15:53, premature ventricular complexes are now present I reviewed and concur with this report. Electronically signed CR:[id number] MD, [place] ([id number]) on 01/14/2019 2:28:52 PM

## 2019-06-08 ENCOUNTER — Encounter
Admission: RE | Disposition: A | Discharge status: Home / Self Care | Payer: Self-pay | Source: Home / Self Care | Attending: Orthopedic Surgery

## 2019-06-08 ENCOUNTER — Ambulatory Visit: Payer: Medicare PPO | Admitting: Anesthesiology

## 2019-06-08 ENCOUNTER — Ambulatory Visit
Admission: RE | Admit: 2019-06-08 | Discharge: 2019-06-08 | Disposition: A | Discharge status: Home / Self Care | Payer: Medicare PPO | Attending: Orthopedic Surgery | Admitting: Orthopedic Surgery

## 2019-06-08 ENCOUNTER — Encounter: Payer: Self-pay | Admitting: Orthopedic Surgery

## 2019-06-08 ENCOUNTER — Ambulatory Visit: Payer: Medicare PPO

## 2019-06-08 ENCOUNTER — Other Ambulatory Visit: Payer: Self-pay

## 2019-06-08 DIAGNOSIS — M419 Scoliosis, unspecified: Secondary | ICD-10-CM | POA: Diagnosis not present

## 2019-06-08 DIAGNOSIS — S22080A Wedge compression fracture of T11-T12 vertebra, initial encounter for closed fracture: Secondary | ICD-10-CM | POA: Insufficient documentation

## 2019-06-08 DIAGNOSIS — Z888 Allergy status to other drugs, medicaments and biological substances status: Secondary | ICD-10-CM | POA: Diagnosis not present

## 2019-06-08 DIAGNOSIS — I480 Paroxysmal atrial fibrillation: Secondary | ICD-10-CM | POA: Insufficient documentation

## 2019-06-08 DIAGNOSIS — X58XXXA Exposure to other specified factors, initial encounter: Secondary | ICD-10-CM | POA: Diagnosis not present

## 2019-06-08 DIAGNOSIS — Z853 Personal history of malignant neoplasm of breast: Secondary | ICD-10-CM | POA: Diagnosis not present

## 2019-06-08 DIAGNOSIS — Z419 Encounter for procedure for purposes other than remedying health state, unspecified: Secondary | ICD-10-CM

## 2019-06-08 DIAGNOSIS — Z8711 Personal history of peptic ulcer disease: Secondary | ICD-10-CM | POA: Diagnosis not present

## 2019-06-08 DIAGNOSIS — Z79899 Other long term (current) drug therapy: Secondary | ICD-10-CM | POA: Insufficient documentation

## 2019-06-08 DIAGNOSIS — I341 Nonrheumatic mitral (valve) prolapse: Secondary | ICD-10-CM | POA: Diagnosis not present

## 2019-06-08 DIAGNOSIS — M810 Age-related osteoporosis without current pathological fracture: Secondary | ICD-10-CM | POA: Diagnosis not present

## 2019-06-08 DIAGNOSIS — K219 Gastro-esophageal reflux disease without esophagitis: Secondary | ICD-10-CM | POA: Insufficient documentation

## 2019-06-08 HISTORY — PX: KYPHOPLASTY: SHX5884

## 2019-06-08 LAB — BASIC METABOLIC PANEL
Anion gap: 7 (ref 5–15)
BUN: 23 mg/dL (ref 8–23)
CO2: 28 mmol/L (ref 22–32)
Calcium: 8.9 mg/dL (ref 8.9–10.3)
Chloride: 96 mmol/L — ABNORMAL LOW (ref 98–111)
Creatinine, Ser: 0.54 mg/dL (ref 0.44–1.00)
GFR calc Af Amer: >60 mL/min (ref >60)
GFR calc non Af Amer: >60 mL/min (ref >60)
Glucose, Bld: 82 mg/dL (ref 70–99)
Potassium: 3.9 mmol/L (ref 3.5–5.1)
Sodium: 131 mmol/L — ABNORMAL LOW (ref 135–145)

## 2019-06-08 LAB — CBC
HCT: 37 % (ref 36.0–46.0)
Hemoglobin: 12.6 g/dL (ref 12.0–15.0)
MCH: 30.5 pg (ref 26.0–34.0)
MCHC: 34.1 g/dL (ref 30.0–36.0)
MCV: 89.6 fL (ref 80.0–100.0)
Platelets: 350 10*3/uL (ref 150–400)
RBC: 4.13 MIL/uL (ref 3.87–5.11)
RDW: 13.4 % (ref 11.5–15.5)
WBC: 6.4 10*3/uL (ref 4.0–10.5)
nRBC: 0 % (ref 0.0–0.2)

## 2019-06-08 SURGERY — KYPHOPLASTY
Anesthesia: General | Site: Back

## 2019-06-08 MED ORDER — LIDOCAINE HCL URETHRAL/MUCOSAL 2 % EX GEL
CUTANEOUS | Status: AC
  Filled 2019-06-08: qty 5

## 2019-06-08 MED ORDER — IOHEXOL 180 MG/ML  SOLN
INTRAMUSCULAR | Status: DC | PRN
  Administered 2019-06-08: 20 mL

## 2019-06-08 MED ORDER — LIDOCAINE HCL (PF) 2 % IJ SOLN
INTRAMUSCULAR | Status: AC
  Filled 2019-06-08: qty 10

## 2019-06-08 MED ORDER — LACTATED RINGERS IV SOLN: INTRAVENOUS | Status: DC

## 2019-06-08 MED ORDER — LIDOCAINE HCL 1 % IJ SOLN
INTRAMUSCULAR | Status: DC | PRN
  Administered 2019-06-08: 20 mL

## 2019-06-08 MED ORDER — BUPIVACAINE HCL (PF) 0.5 % IJ SOLN
INTRAMUSCULAR | Status: AC
  Filled 2019-06-08: qty 30

## 2019-06-08 MED ORDER — PROPOFOL 500 MG/50ML IV EMUL
INTRAVENOUS | Status: AC
  Filled 2019-06-08: qty 50

## 2019-06-08 MED ORDER — MIDAZOLAM HCL 2 MG/2ML IJ SOLN
INTRAMUSCULAR | Status: AC
  Filled 2019-06-08: qty 2

## 2019-06-08 MED ORDER — OXYCODONE HCL 5 MG PO TABS: 5.0000 mg | ORAL_TABLET | Freq: Once | ORAL | Status: DC | PRN

## 2019-06-08 MED ORDER — FENTANYL CITRATE (PF) 100 MCG/2ML IJ SOLN
INTRAMUSCULAR | Status: DC | PRN
  Administered 2019-06-08 (×2): 25 ug via INTRAVENOUS
  Administered 2019-06-08: 50 ug via INTRAVENOUS

## 2019-06-08 MED ORDER — PROPOFOL 500 MG/50ML IV EMUL
INTRAVENOUS | Status: DC | PRN
  Administered 2019-06-08: 30 ug/kg/min via INTRAVENOUS

## 2019-06-08 MED ORDER — CEFAZOLIN SODIUM-DEXTROSE 1-4 GM/50ML-% IV SOLN
1.0000 g | INTRAVENOUS | Status: AC
  Administered 2019-06-08: 1 g via INTRAVENOUS

## 2019-06-08 MED ORDER — LIDOCAINE HCL (PF) 1 % IJ SOLN
INTRAMUSCULAR | Status: AC
  Filled 2019-06-08: qty 30

## 2019-06-08 MED ORDER — OXYCODONE-ACETAMINOPHEN 5-325 MG PO TABS
ORAL_TABLET | ORAL | Status: AC
  Filled 2019-06-08: qty 1

## 2019-06-08 MED ORDER — OXYCODONE HCL 5 MG/5ML PO SOLN: 5.0000 mg | Freq: Once | ORAL | Status: DC | PRN

## 2019-06-08 MED ORDER — FENTANYL CITRATE (PF) 100 MCG/2ML IJ SOLN
25.0000 ug | INTRAMUSCULAR | Status: DC | PRN
  Administered 2019-06-08: 25 ug via INTRAVENOUS

## 2019-06-08 MED ORDER — OXYCODONE-ACETAMINOPHEN 5-325 MG PO TABS
1.0000 | ORAL_TABLET | Freq: Once | ORAL | Status: AC
  Administered 2019-06-08: 1 via ORAL

## 2019-06-08 MED ORDER — CEFAZOLIN SODIUM-DEXTROSE 1-4 GM/50ML-% IV SOLN
INTRAVENOUS | Status: AC
  Filled 2019-06-08: qty 50

## 2019-06-08 MED ORDER — BUPIVACAINE-EPINEPHRINE (PF) 0.5% -1:200000 IJ SOLN
INTRAMUSCULAR | Status: DC | PRN
  Administered 2019-06-08: 10 mL

## 2019-06-08 MED ORDER — PROPOFOL 10 MG/ML IV BOLUS
INTRAVENOUS | Status: DC | PRN
  Administered 2019-06-08: 40 mg via INTRAVENOUS

## 2019-06-08 MED ORDER — PROPOFOL 10 MG/ML IV BOLUS
INTRAVENOUS | Status: AC
  Filled 2019-06-08: qty 20

## 2019-06-08 MED ORDER — FENTANYL CITRATE (PF) 100 MCG/2ML IJ SOLN
INTRAMUSCULAR | Status: AC
  Administered 2019-06-08: 25 ug via INTRAVENOUS
  Filled 2019-06-08: qty 2

## 2019-06-08 MED ORDER — FENTANYL CITRATE (PF) 100 MCG/2ML IJ SOLN
INTRAMUSCULAR | Status: AC
  Filled 2019-06-08: qty 2

## 2019-06-08 SURGICAL SUPPLY — 21 items
CEMENT KYPHON CX01A KIT/MIXER (Cement) ×3 IMPLANT
COVER WAND RF STERILE (DRAPES) ×3 IMPLANT
DERMABOND ADVANCED (GAUZE/BANDAGES/DRESSINGS) ×2
DERMABOND ADVANCED .7 DNX12 (GAUZE/BANDAGES/DRESSINGS) ×1 IMPLANT
DEVICE BIOPSY BONE KYPHX (INSTRUMENTS) ×5 IMPLANT
DRAPE C-ARM XRAY 36X54 (DRAPES) ×3 IMPLANT
DURAPREP 26ML APPLICATOR (WOUND CARE) ×3 IMPLANT
FEE RENTAL RFA GENERATOR (MISCELLANEOUS) IMPLANT
GLOVE SURG SYN 9.0  PF PI (GLOVE) ×2
GLOVE SURG SYN 9.0 PF PI (GLOVE) ×1 IMPLANT
GOWN SRG 2XL LVL 4 RGLN SLV (GOWNS) ×1 IMPLANT
GOWN STRL NON-REIN 2XL LVL4 (GOWNS) ×2
GOWN STRL REUS W/ TWL LRG LVL3 (GOWN DISPOSABLE) ×1 IMPLANT
GOWN STRL REUS W/TWL LRG LVL3 (GOWN DISPOSABLE) ×2
PACK KYPHOPLASTY (MISCELLANEOUS) ×3 IMPLANT
RENTAL RFA  GENERATOR (MISCELLANEOUS)
RENTAL RFA GENERATOR (MISCELLANEOUS) IMPLANT
STRAP SAFETY 5IN WIDE (MISCELLANEOUS) ×3 IMPLANT
SWABSTK COMLB BENZOIN TINCTURE (MISCELLANEOUS) ×3 IMPLANT
TRAY KYPHOPAK 15/3 EXPRESS 1ST (MISCELLANEOUS) ×3 IMPLANT
TRAY KYPHOPAK 20/3 EXPRESS 1ST (MISCELLANEOUS) ×1 IMPLANT

## 2019-06-08 NOTE — Op Note (Signed)
Date Jun 08, 2019  Time :31 pm   PATIENT:  Katie Harding   PRE-OPERATIVE DIAGNOSIS:  closed wedge compression fracture of T12   POST-OPERATIVE DIAGNOSIS:  closed wedge compression fracture of T12   PROCEDURE:  Procedure(s): KYPHOPLASTY T12  SURGEON: Laurene Footman, MD   ASSISTANTS: None   ANESTHESIA:   local and MAC   EBL:  No intake/output data recorded.   BLOOD ADMINISTERED:none   DRAINS: none    LOCAL MEDICATIONS USED:  MARCAINE    and XYLOCAINE    SPECIMEN:   T12 vertebral body biopsy   DISPOSITION OF SPECIMEN:  Pathology   COUNTS:  YES   TOURNIQUET:  * No tourniquets in log *   IMPLANTS: Bone cement   DICTATION: .Dragon Dictation  patient was brought to the operating room and after adequate anesthesia was obtained the patient was placed prone.  C arm was brought in in good visualization of the affected level obtained on both AP and lateral projections.  After patient identification and timeout procedures were completed, local anesthetic was infiltrated with 10 cc 1% Xylocaine infiltrated subcutaneously.  This is done the area on each t side of the planned approach.  The back was then prepped and draped in the usual sterile manner and repeat timeout procedure carried out.  A spinal needle was brought down to the pedicle on the each side of  T12 and a 50-50 mix of 1% Xylocaine half percent Sensorcaine with epinephrine total of 20 cc injected.  After allowing this to set a small incision was made on the left and the trocar was advanced into the vertebral body in an extrapedicular fashion.  Biopsy was obtained Drilling was carried out balloon inserted with inflation to  5 cc.    The balloon past the midline and inflation went on both sides so a second sided stick was not required there is partial correction of superior endplate deformity.  When the cement was appropriate consistency 6 cc were injected into the vertebral body without extravasation, good fill superior to inferior  endplates and from right to left sides along the inferior endplate.  After the cement had set the trochar was removed and permanent C-arm views obtained.  The wound was closed with Dermabond followed by Band-Aid   PLAN OF CARE: Discharge to home after PACU   PATIENT DISPOSITION:  PACU - hemodynamically stable.

## 2019-06-08 NOTE — Progress Notes (Signed)
Patient arrived to Post op, went to restroom and got dressed. When she got to the room patient was complaining of a 7/10 pain with elevated blood pressure. Paged Dr Rudene Christians and got a call back. Discussed with Dr. Sharlyne Pacas order for pain medication she has at home which is percocet 5/325mg  1-2 tablets po once. Patient given one pill. Will continue to monitor.

## 2019-06-08 NOTE — H&P (Signed)
Chief Complaint  Patient presents with  . Establish Care  New T12 Compression fx, Prev L1, L2, L3 kyphoplasties   History of the Present Illness: Katie Harding is a 79 y.o. female here for evaluation of T12 compression fracture seen on MRI from 06/01/2019. The patient comes in to discuss possible kyphoplasty.   The patient presents in a wheelchair. She is accompanied by a female who gives most of the history. She states the patient injured her back 2 weeks ago today. She states she is normally very active, but she has had back problems for approximately 1 year. The patient has had epidural injections with Dr. Sharlet Salina. She has also had kyphoplasty by Dr. Deri Fuelling in [place] over 1 year ago, and she has had problems since that time. She has pain much of the time. The patient has scoliosis and that has kept her from walking. The patient has a numb leg some of the time, and feels like it has ice water in it, as well as feeling like it is on fire.   The patient has a remote history of a fractured pelvis. She state she has a large bulge to the right side of her abdomen with great discomfort after eating. She feels as through her ribs are poking into something. She also has constipation that is being treated with a cleanse.   She was admitted to the hospital after taking hydrochlorothiazide due to low sodium.   The patient's husband has Alzheimer dementia and she tells me she cannot undergo a large surgery due to this.   I have reviewed past medical, surgical, social and family history, and allergies as documented in the EMR.  Past Medical History: Past Medical History:  Diagnosis Date  . Colon polyp  adenoma  . Gastroesophageal reflux disease 04/04/2015  . GERD (gastroesophageal reflux disease)  . H/O gastric ulcer 04/2001  . History of right breast cancer  post surgery/XRT  . MVP (mitral valve prolapse)  dental prophylaxis  . Osteoporosis, post-menopausal  . Paroxysmal atrial  fibrillation (CMS-HCC) 07/08/2018  . Surgical menopause   Past Surgical History: Past Surgical History:  Procedure Laterality Date  . cataract surgery  . CHOLECYSTECTOMY  . COLONOSCOPY 12/15/1999  Tubulovillous Adenoma, [facility] (Brother)  . COLONOSCOPY 06/18/2001  Adenomatous Polyp, [facility] (Brother)  . COLONOSCOPY 11/29/2004  PH Adenomatous Polyps, [facility] (Brother)  . COLONOSCOPY 03/14/2010  PH Adenomatous Polyps, [facility] (Brother): CBF 03/2015; Recall Ltr mailed 01/19/2015 (dw)  . COLONOSCOPY 05/05/2015  PH Adenomatous Polyps, [facility] (Brother): No repeat due to age per RTE (dw)  . DEVIATED SEPTUM SURGERY  . EGD 12/24/2006  01/06/2004, 08/27/2001, 06/18/2001  . HYSTERECTOMY  TAH/BSO for endometriosis in the 1980s  . KYPHOPLASTY  3 vertebra Dr. Trenton Gammon on 12/29/2018  . KYPHOPLASTY 12/29/2018  . MASTECTOMY PARTIAL / LUMPECTOMY  . REMOVAL OVARIAN CYST  . ROTATOR CUFF REPAIR Right  . TONSILLECTOMY   Past Family History: Family History  Problem Relation Age of Onset  . Stroke Mother  . High blood pressure (Hypertension) Mother  . Breast cancer Other  Aunt  . Colon cancer Brother   Medications: Current Outpatient Medications Ordered in Epic  Medication Sig Dispense Refill  . acetaminophen (TYLENOL EXTRA STRENGTH ORAL) Take by mouth 3 (three) times daily as needed  . ALPRAZolam (XANAX) 0.25 MG tablet Take 1 tablet (0.25 mg total) by mouth 3 (three) times daily as needed for Sleep 90 tablet 5  . amoxicillin (AMOXIL) 500 MG capsule Take 500 mg by mouth as  directed Prescribed by dentist  . bisacodyL (DULCOLAX) 5 mg EC tablet Take 1 tablet (5 mg total) by mouth once daily for 30 days 30 tablet 0  . CALCIUM CITRATE/VITAMIN D3 (CITRACAL + D ORAL) Take by mouth. 2 po bid  . cholecalciferol (VITAMIN D3) 2,000 unit capsule Take 2,000 Units by mouth once daily.  Marland Kitchen co-enzyme Q-10, ubiquinone, (CO Q-10) 100 mg capsule Take 200 mg by mouth once daily.  Marland Kitchen diltiazem (CARDIZEM CD) 120 MG XR capsule TAKE  ONE CAPSULE EACH NIGHT 30 capsule 11  . famotidine (PEPCID) 40 MG tablet Take 1 tablet (40 mg total) by mouth nightly 30 tablet 11  . GLUC/CHON-MSM#1/VIT C/MANG/BOR (GLUCOSAMINE-CHOND-MSM COMPLEX ORAL) Take by mouth once daily.   . indapamide (LOZOL) 1.25 MG tablet Take 1 tablet (1.25 mg total) by mouth once daily as needed (edema) 30 tablet 2  . magnesium oxide (MAG-OX) 400 mg tablet Take 400 mg by mouth 2 (two) times daily.  . metoclopramide (REGLAN) 5 MG tablet Take 1 tablet (5 mg total) by mouth 3 (three) times a day for 30 days 90 tablet 3  . metroNIDAZOLE (METROCREAM) 0.75 % cream  . multivitamin tablet Take 1 tablet by mouth once daily.  Marland Kitchen omeprazole (PRILOSEC) 40 MG DR capsule Take 1 capsule (40 mg total) by mouth 2 (two) times daily before meals 180 capsule 0  . oxyCODONE-acetaminophen (PERCOCET) 5-325 mg tablet Take 1 tablet by mouth 2 (two) times daily for 20 days 40 tablet 0  . propranoloL (INDERAL) 40 MG tablet Take 1 tablet (40 mg total) by mouth 3 (three) times daily 90 tablet 11  . sodium chloride 1 gram tablet Take 1 g by mouth once daily  . sucralfate (CARAFATE) 1 gram tablet TAKE ONE TABLET BY MOUTH 3 TIMES DAILY 270 tablet 3   No current Epic-ordered facility-administered medications on file.   Allergies: Allergies  Allergen Reactions  . Hydrochlorothiazide Other (See Comments)  Severe hyponatremia    Body mass index is 17.14 kg/m.  Review of Systems: A comprehensive 14 point ROS was performed, reviewed, and the pertinent orthopaedic findings are documented in the HPI.  Vitals:  06/05/19 1032  BP: 128/66   General Physical Examination:  General/Constitutional: No apparent distress: well-nourished and well developed. Eyes: Pupils equal, round with synchronous movement. Lungs: Clear to auscultation HEENT: Normal Vascular: No edema, swelling or tenderness, except as noted in detailed exam. Cardiac: Heart rate and rhythm is regular. Integumentary: No  impressive skin lesions present, except as noted in detailed exam. Neuro/Psych: Normal mood and affect, oriented to person, place and time.  Musculoskeletal Examination: On exam, lungs are clear. Heart rate and rhythm is normal. HEENT is normal. Teeth remarkable for nothing loose or removeable.   Radiographs: No new imaging studies were obtained or reviewed today.  Assessment: ICD-10-CM  1. Thoracic compression fracture, closed, initial encounter (CMS-HCC) S22.000A   Plan: The patient has clinical findings of T12 compression fracture.   We discussed the patient's prior MRI findings. We discussed kyphoplasty in detail. We discussed if the surgery does not relieve her pain, she can be referred to [facility] for a procedure, as well as getting another injection in the location where the left ribs are hitting her pelvis.   We will schedule the patient for T12 kyphoplasty next week.   Surgical Risks:  The nature of the condition and the proposed procedure has been reviewed in detail with the patient. Surgical versus non-surgical options and prognosis for recovery have been reviewed  and the inherent risks and benefits of each have been discussed including the risks of infection, bleeding, injury to nerves/blood vessels/tendons, incomplete relief of symptoms, persisting pain and/or stiffness, loss of function, complex regional pain syndrome, failure of the procedure, as appropriate.  Teeth: Teeth remarkable for nothing loose or removeable  Scribe Attestation: I, Dawn Royse, am acting as scribe for [employer], MD    Electronically signed by Lauris Poag, MD at 06/06/2019 5:27 PM EDT   Reviewed paper H+P, will be scanned into chart. No changes noted.

## 2019-06-08 NOTE — Anesthesia Postprocedure Evaluation (Signed)
Anesthesia Post Note  Patient: Katie Harding  Procedure(s) Performed: T12 KYPHOPLASTY (N/A Back)  Patient location during evaluation: PACU Anesthesia Type: General Level of consciousness: awake and alert Pain management: pain level controlled Vital Signs Assessment: post-procedure vital signs reviewed and stable Respiratory status: spontaneous breathing, nonlabored ventilation, respiratory function stable and patient connected to nasal cannula oxygen Cardiovascular status: blood pressure returned to baseline and stable Postop Assessment: no apparent nausea or vomiting Anesthetic complications: no     Last Vitals:  Vitals:   06/08/19 1506 06/08/19 1515  BP: (!) 153/84 (!) 162/87  Pulse: 65 64  Resp: 15 12  Temp:  (!) 36.2 C  SpO2: 96% 99%    Last Pain:  Vitals:   06/08/19 1515  TempSrc:   PainSc: 3                  Precious Haws Pete Merten

## 2019-06-08 NOTE — Transfer of Care (Signed)
Immediate Anesthesia Transfer of Care Note  Patient: Katie Harding  Procedure(s) Performed: T12 KYPHOPLASTY (N/A Back)  Patient Location: PACU  Anesthesia Type:General  Level of Consciousness: awake  Airway & Oxygen Therapy: Patient Spontanous Breathing and Patient connected to face mask oxygen  Post-op Assessment: Post -op Vital signs reviewed and stable  Post vital signs: stable  Last Vitals:  Vitals Value Taken Time  BP 155/88 06/08/19 1451  Temp 36.1 C 06/08/19 1436  Pulse 67 06/08/19 1504  Resp 13 06/08/19 1504  SpO2 99 % 06/08/19 1504  Vitals shown include unvalidated device data.  Last Pain:  Vitals:   06/08/19 1455  TempSrc:   PainSc: 3          Complications: No apparent anesthesia complications

## 2019-06-08 NOTE — Discharge Instructions (Addendum)
Take it easy today and tomorrow.  Remove Band-Aids on Wednesday then okay to shower.  Try to avoid bending at the waist or lifting over 5 pounds for 2 weeks.  Try to walk is much as you can.  Call office if you have a sudden increase in pain. [place]   1) The drugs that you were given will stay in your system until tomorrow so for the next 24 hours you should not:  A) Drive an automobile B) Make any legal decisions C) Drink any alcoholic beverage   2) You may resume regular meals tomorrow.  Today it is better to start with liquids and gradually work up to solid foods.  You may eat anything you prefer, but it is better to start with liquids, then soup and crackers, and gradually work up to solid foods.   3) Please notify your doctor immediately if you have any unusual bleeding, trouble breathing, redness and pain at the surgery site, drainage, fever, or pain not relieved by medication.   4) Additional Instructions:   Please contact your physician with any problems or Same Day Surgery at [phone], Monday through Friday 6 am to 4 pm, or  at [facility] number at [phone].

## 2019-06-08 NOTE — Anesthesia Preprocedure Evaluation (Addendum)
Anesthesia Evaluation  Patient identified by MRN, date of birth, ID band Patient awake    Reviewed: Allergy & Precautions, H&P , NPO status , Patient's Chart, lab work & pertinent test results  History of Anesthesia Complications (+) PONV and history of anesthetic complications  Airway Mallampati: I  TM Distance: >3 FB Neck ROM: full    Dental  (+) Chipped   Pulmonary neg pulmonary ROS, neg shortness of breath,    Pulmonary exam normal        Cardiovascular Exercise Tolerance: Good (-) angina(-) Past MI and (-) DOE Normal cardiovascular exam+ dysrhythmias Atrial Fibrillation + Valvular Problems/Murmurs MVP      Neuro/Psych PSYCHIATRIC DISORDERS negative neurological ROS     GI/Hepatic Neg liver ROS, GERD  ,  Endo/Other  negative endocrine ROS  Renal/GU negative Renal ROS  negative genitourinary   Musculoskeletal   Abdominal   Peds  Hematology negative hematology ROS (+)   Anesthesia Other Findings Past Medical History: [id number] No date: Anxiety No date: Arthritis     Comment:  osteoporosis 2006: Breast cancer ([place])     Comment:  right breast lumpectomy with radiation. no lymph nodes               involved No date: Colon polyp     Comment:  adenoma No date: Complication of anesthesia 04/2019: Constipation     Comment:  on a new bowel regime No date: Dyspnea No date: Dysrhythmia     Comment:  ATRIAL FIBRILLATION No date: Fracture of vertebra due to osteoporosis ([place])     Comment:  history of L1, L2 and now T12 04/04/2015: GERD (gastroesophageal reflux disease) No date: Heart disease 04/2001: History of gastric ulcer No date: History of right breast cancer     Comment:  post surgery/ XRT No date: MVP (mitral valve prolapse)     Comment:  dental prophylaxis No date: Osteoporosis No date: Osteoporosis, post-menopausal 2006: Personal history of radiation therapy     Comment:  right breast ca No  date: PONV (postoperative nausea and vomiting)     Comment:  NAUSEA ON SEVERAL OCCASIONS No date: Scoliosis No date: Surgical menopause  Past Surgical History: 2006: BREAST BIOPSY; Right     Comment:  breast ca 2006: BREAST LUMPECTOMY; Right     Comment:  breast ca No date: CATARACT EXTRACTION No date: CHOLECYSTECTOMY 12/15/1999: COLONOSCOPY     Comment:  Tubulovillous Adenoma [facility] (Brother) 11/29/2004: COLONOSCOPY     Comment:  PH Adenomatous Polyps, [facility] (Brother) 03/14/2010: COLONOSCOPY     Comment:  PH Adenomatous Polyps, [facility] (Brother); CBF 03/2015, Ltr               mailed 01/19/2015 (dw) 05/05/2015: COLONOSCOPY     Comment:  PH Adenomatous Polyps, [facility] (Brother); No repeat due to               age per RTE (dw) 05/05/2015: COLONOSCOPY WITH PROPOFOL; N/A     Comment:  Procedure: COLONOSCOPY WITH PROPOFOL;  Surgeon: Manya Silvas, MD;  Location: [facility];  Service:               Endoscopy;  Laterality: N/A; 12/24/2006: ESOPHAGOGASTRODUODENOSCOPY     Comment:  01/06/2004, 08/27/2001, 06/18/2001 05/05/2015: ESOPHAGOGASTRODUODENOSCOPY (EGD) WITH PROPOFOL; N/A     Comment:  Procedure: ESOPHAGOGASTRODUODENOSCOPY (EGD) WITH               PROPOFOL;  Surgeon: Manya Silvas, MD;  Location: [facility]              ENDOSCOPY;  Service: Endoscopy;  Laterality: N/A; No date: EYE SURGERY; Bilateral     Comment:  cataracts removed No date: NASAL SEPTUM SURGERY No date: NASAL SEPTUM SURGERY No date: ovarian cyst removed No date: ROTATOR CUFF REPAIR; Right No date: TONSILLECTOMY No date: TONSILLECTOMY 1980s: TOTAL ABDOMINAL HYSTERECTOMY W/ BILATERAL SALPINGOOPHORECTOMY     Comment:  for endometriosis in 1980s     Reproductive/Obstetrics negative OB ROS                            Anesthesia Physical Anesthesia Plan  ASA: III  Anesthesia Plan: General   Post-op Pain Management:    Induction: Intravenous  PONV Risk Score and Plan: Propofol  infusion and TIVA  Airway Management Planned: Natural Airway and Nasal Cannula  Additional Equipment:   Intra-op Plan:   Post-operative Plan:   Informed Consent: I have reviewed the patients History and Physical, chart, labs and discussed the procedure including the risks, benefits and alternatives for the proposed anesthesia with the patient or authorized representative who has indicated his/her understanding and acceptance.     Dental Advisory Given  Plan Discussed with: Anesthesiologist, CRNA and Surgeon  Anesthesia Plan Comments: (Patient consented for risks of anesthesia including but not limited to:  - adverse reactions to medications - risk of intubation if required - damage to eyes, teeth, lips or other oral mucosa - nerve damage due to positioning  - sore throat or hoarseness - Damage to heart, brain, nerves, lungs or loss of life  Patient voiced understanding.)        Anesthesia Quick Evaluation

## 2019-06-10 LAB — SURGICAL PATHOLOGY

## 2019-06-22 ENCOUNTER — Other Ambulatory Visit: Payer: Self-pay | Admitting: Orthopedic Surgery

## 2019-06-22 DIAGNOSIS — S22080A Wedge compression fracture of T11-T12 vertebra, initial encounter for closed fracture: Secondary | ICD-10-CM

## 2019-06-23 ENCOUNTER — Ambulatory Visit
Admission: RE | Admit: 2019-06-23 | Discharge: 2019-06-23 | Disposition: A | Discharge status: Home / Self Care | Payer: Medicare PPO | Source: Ambulatory Visit | Attending: Orthopedic Surgery | Admitting: Orthopedic Surgery

## 2019-06-23 ENCOUNTER — Other Ambulatory Visit: Payer: Self-pay

## 2019-06-23 DIAGNOSIS — S22080A Wedge compression fracture of T11-T12 vertebra, initial encounter for closed fracture: Secondary | ICD-10-CM | POA: Diagnosis present

## 2019-06-26 ENCOUNTER — Other Ambulatory Visit: Payer: Self-pay | Admitting: Orthopedic Surgery

## 2019-06-30 ENCOUNTER — Encounter
Admission: RE | Disposition: A | Discharge status: Home / Self Care | Payer: Self-pay | Source: Home / Self Care | Attending: Orthopedic Surgery

## 2019-06-30 ENCOUNTER — Ambulatory Visit
Admission: RE | Admit: 2019-06-30 | Discharge: 2019-06-30 | Disposition: A | Discharge status: Home / Self Care | Payer: Medicare PPO | Attending: Orthopedic Surgery | Admitting: Orthopedic Surgery

## 2019-06-30 ENCOUNTER — Other Ambulatory Visit
Admission: RE | Admit: 2019-06-30 | Discharge: 2019-06-30 | Disposition: A | Discharge status: Home / Self Care | Payer: Medicare PPO | Source: Ambulatory Visit | Attending: Orthopedic Surgery | Admitting: Orthopedic Surgery

## 2019-06-30 ENCOUNTER — Other Ambulatory Visit: Payer: Self-pay

## 2019-06-30 ENCOUNTER — Ambulatory Visit: Payer: Medicare PPO | Admitting: Certified Registered"

## 2019-06-30 ENCOUNTER — Ambulatory Visit: Payer: Medicare PPO

## 2019-06-30 ENCOUNTER — Encounter: Payer: Self-pay | Admitting: Orthopedic Surgery

## 2019-06-30 DIAGNOSIS — Z87310 Personal history of (healed) osteoporosis fracture: Secondary | ICD-10-CM | POA: Diagnosis not present

## 2019-06-30 DIAGNOSIS — Z8711 Personal history of peptic ulcer disease: Secondary | ICD-10-CM | POA: Diagnosis not present

## 2019-06-30 DIAGNOSIS — Z79899 Other long term (current) drug therapy: Secondary | ICD-10-CM | POA: Diagnosis not present

## 2019-06-30 DIAGNOSIS — M810 Age-related osteoporosis without current pathological fracture: Secondary | ICD-10-CM | POA: Diagnosis not present

## 2019-06-30 DIAGNOSIS — Z888 Allergy status to other drugs, medicaments and biological substances status: Secondary | ICD-10-CM | POA: Insufficient documentation

## 2019-06-30 DIAGNOSIS — Z923 Personal history of irradiation: Secondary | ICD-10-CM | POA: Insufficient documentation

## 2019-06-30 DIAGNOSIS — M4854XA Collapsed vertebra, not elsewhere classified, thoracic region, initial encounter for fracture: Secondary | ICD-10-CM | POA: Insufficient documentation

## 2019-06-30 DIAGNOSIS — F419 Anxiety disorder, unspecified: Secondary | ICD-10-CM | POA: Diagnosis not present

## 2019-06-30 DIAGNOSIS — K219 Gastro-esophageal reflux disease without esophagitis: Secondary | ICD-10-CM | POA: Diagnosis not present

## 2019-06-30 DIAGNOSIS — Z20822 Contact with and (suspected) exposure to covid-19: Secondary | ICD-10-CM | POA: Insufficient documentation

## 2019-06-30 DIAGNOSIS — Z886 Allergy status to analgesic agent status: Secondary | ICD-10-CM | POA: Diagnosis not present

## 2019-06-30 DIAGNOSIS — Z419 Encounter for procedure for purposes other than remedying health state, unspecified: Secondary | ICD-10-CM

## 2019-06-30 DIAGNOSIS — Z853 Personal history of malignant neoplasm of breast: Secondary | ICD-10-CM | POA: Insufficient documentation

## 2019-06-30 HISTORY — PX: KYPHOPLASTY: SHX5884

## 2019-06-30 LAB — SARS CORONAVIRUS 2 BY RT PCR (HOSPITAL ORDER, PERFORMED IN ~~LOC~~ HOSPITAL LAB): SARS Coronavirus 2: NEGATIVE

## 2019-06-30 SURGERY — KYPHOPLASTY
Anesthesia: General | Site: Back

## 2019-06-30 MED ORDER — FAMOTIDINE 20 MG PO TABS: 20.0000 mg | ORAL_TABLET | Freq: Once | ORAL | Status: DC

## 2019-06-30 MED ORDER — CHLORHEXIDINE GLUCONATE 0.12 % MT SOLN: 15.0000 mL | Freq: Once | OROMUCOSAL | Status: AC

## 2019-06-30 MED ORDER — ONDANSETRON HCL 4 MG PO TABS: 4.0000 mg | ORAL_TABLET | Freq: Four times a day (QID) | ORAL | Status: DC | PRN

## 2019-06-30 MED ORDER — CHLORHEXIDINE GLUCONATE 0.12 % MT SOLN
OROMUCOSAL | Status: AC
  Administered 2019-06-30: 15 mL via OROMUCOSAL
  Filled 2019-06-30: qty 15

## 2019-06-30 MED ORDER — HYDROCODONE-ACETAMINOPHEN 7.5-325 MG PO TABS
1.0000 | ORAL_TABLET | Freq: Once | ORAL | Status: AC
  Administered 2019-06-30: 1 via ORAL
  Filled 2019-06-30: qty 1

## 2019-06-30 MED ORDER — ONDANSETRON HCL 4 MG/2ML IJ SOLN: 4.0000 mg | Freq: Once | INTRAMUSCULAR | Status: DC | PRN

## 2019-06-30 MED ORDER — HYDROCODONE-ACETAMINOPHEN 7.5-325 MG PO TABS: 1.0000 | ORAL_TABLET | ORAL | 0 refills | Status: DC | PRN

## 2019-06-30 MED ORDER — SODIUM CHLORIDE 0.9 % IV SOLN: INTRAVENOUS | Status: DC

## 2019-06-30 MED ORDER — PROPOFOL 10 MG/ML IV BOLUS
INTRAVENOUS | Status: AC
  Filled 2019-06-30: qty 20

## 2019-06-30 MED ORDER — METOCLOPRAMIDE HCL 5 MG/ML IJ SOLN: 5.0000 mg | Freq: Three times a day (TID) | INTRAMUSCULAR | Status: DC | PRN

## 2019-06-30 MED ORDER — CEFAZOLIN SODIUM-DEXTROSE 2-4 GM/100ML-% IV SOLN
2.0000 g | INTRAVENOUS | Status: AC
  Administered 2019-06-30: 2 g via INTRAVENOUS

## 2019-06-30 MED ORDER — PROPOFOL 500 MG/50ML IV EMUL
INTRAVENOUS | Status: DC | PRN
  Administered 2019-06-30: 30 ug/kg/min via INTRAVENOUS

## 2019-06-30 MED ORDER — BUPIVACAINE-EPINEPHRINE (PF) 0.5% -1:200000 IJ SOLN
INTRAMUSCULAR | Status: AC
  Filled 2019-06-30: qty 90

## 2019-06-30 MED ORDER — BUPIVACAINE-EPINEPHRINE (PF) 0.5% -1:200000 IJ SOLN
INTRAMUSCULAR | Status: DC | PRN
  Administered 2019-06-30: 10 mL via PERINEURAL

## 2019-06-30 MED ORDER — FENTANYL CITRATE (PF) 100 MCG/2ML IJ SOLN
INTRAMUSCULAR | Status: AC
  Filled 2019-06-30: qty 2

## 2019-06-30 MED ORDER — METOCLOPRAMIDE HCL 10 MG PO TABS: 5.0000 mg | ORAL_TABLET | Freq: Three times a day (TID) | ORAL | Status: DC | PRN

## 2019-06-30 MED ORDER — LIDOCAINE HCL 1 % IJ SOLN
INTRAMUSCULAR | Status: DC | PRN
  Administered 2019-06-30: 20 mL

## 2019-06-30 MED ORDER — ORAL CARE MOUTH RINSE: 15.0000 mL | Freq: Once | OROMUCOSAL | Status: AC

## 2019-06-30 MED ORDER — CEFAZOLIN SODIUM-DEXTROSE 2-4 GM/100ML-% IV SOLN
INTRAVENOUS | Status: AC
  Filled 2019-06-30: qty 100

## 2019-06-30 MED ORDER — LACTATED RINGERS IV SOLN: INTRAVENOUS | Status: DC

## 2019-06-30 MED ORDER — FAMOTIDINE 20 MG PO TABS
ORAL_TABLET | ORAL | Status: AC
  Filled 2019-06-30: qty 1

## 2019-06-30 MED ORDER — FENTANYL CITRATE (PF) 100 MCG/2ML IJ SOLN
25.0000 ug | INTRAMUSCULAR | Status: DC | PRN
  Administered 2019-06-30 (×2): 25 ug via INTRAVENOUS

## 2019-06-30 MED ORDER — HYDROCODONE-ACETAMINOPHEN 7.5-325 MG PO TABS
ORAL_TABLET | ORAL | Status: AC
  Filled 2019-06-30: qty 1

## 2019-06-30 MED ORDER — PROPOFOL 10 MG/ML IV BOLUS
INTRAVENOUS | Status: DC | PRN
  Administered 2019-06-30: 30 mg via INTRAVENOUS

## 2019-06-30 MED ORDER — ONDANSETRON HCL 4 MG/2ML IJ SOLN: 4.0000 mg | Freq: Four times a day (QID) | INTRAMUSCULAR | Status: DC | PRN

## 2019-06-30 MED ORDER — FENTANYL CITRATE (PF) 100 MCG/2ML IJ SOLN
INTRAMUSCULAR | Status: DC | PRN
  Administered 2019-06-30: 25 ug via INTRAVENOUS

## 2019-06-30 MED ORDER — LIDOCAINE HCL (PF) 1 % IJ SOLN
INTRAMUSCULAR | Status: AC
  Filled 2019-06-30: qty 60

## 2019-06-30 MED ORDER — FENTANYL CITRATE (PF) 100 MCG/2ML IJ SOLN
INTRAMUSCULAR | Status: AC
  Administered 2019-06-30: 25 ug via INTRAVENOUS
  Filled 2019-06-30: qty 2

## 2019-06-30 SURGICAL SUPPLY — 22 items
CEMENT KYPHON CX01A KIT/MIXER (Cement) ×3 IMPLANT
COVER WAND RF STERILE (DRAPES) ×3 IMPLANT
DERMABOND ADVANCED (GAUZE/BANDAGES/DRESSINGS) ×2
DERMABOND ADVANCED .7 DNX12 (GAUZE/BANDAGES/DRESSINGS) ×1 IMPLANT
DEVICE BIOPSY BONE KYPHX (INSTRUMENTS) ×3 IMPLANT
DRAPE C-ARM XRAY 36X54 (DRAPES) ×3 IMPLANT
DURAPREP 26ML APPLICATOR (WOUND CARE) ×3 IMPLANT
FEE RENTAL RFA GENERATOR (MISCELLANEOUS) IMPLANT
GLOVE SURG SYN 9.0  PF PI (GLOVE) ×2
GLOVE SURG SYN 9.0 PF PI (GLOVE) ×1 IMPLANT
GOWN SRG 2XL LVL 4 RGLN SLV (GOWNS) ×1 IMPLANT
GOWN STRL NON-REIN 2XL LVL4 (GOWNS) ×2
GOWN STRL REUS W/ TWL LRG LVL3 (GOWN DISPOSABLE) ×1 IMPLANT
GOWN STRL REUS W/TWL LRG LVL3 (GOWN DISPOSABLE) ×2
PACK KYPHOPLASTY (MISCELLANEOUS) ×3 IMPLANT
RENTAL RFA  GENERATOR (MISCELLANEOUS)
RENTAL RFA GENERATOR (MISCELLANEOUS) IMPLANT
STRAP SAFETY 5IN WIDE (MISCELLANEOUS) ×3 IMPLANT
SWABSTK COMLB BENZOIN TINCTURE (MISCELLANEOUS) ×3 IMPLANT
TRAY KYPHOPAK 15/2 EXPRESS (KITS) ×2 IMPLANT
TRAY KYPHOPAK 15/3 EXPRESS 1ST (MISCELLANEOUS) ×1 IMPLANT
TRAY KYPHOPAK 20/3 EXPRESS 1ST (MISCELLANEOUS) ×1 IMPLANT

## 2019-06-30 NOTE — Discharge Instructions (Addendum)
Take it easy today and tomorrow.  Remove Band-Aid on Thursday.  Okay to shower after that. Call office if you have a sudden increase in pain. Call office if you need a refill of pain medication. Try to walk all you can without bending over or lifting.  AMBULATORY SURGERY  DISCHARGE INSTRUCTIONS   1) The drugs that you were given will stay in your system until tomorrow so for the next 24 hours you should not:  A) Drive an automobile B) Make any legal decisions C) Drink any alcoholic beverage   2) You may resume regular meals tomorrow.  Today it is better to start with liquids and gradually work up to solid foods.  You may eat anything you prefer, but it is better to start with liquids, then soup and crackers, and gradually work up to solid foods.   3) Please notify your doctor immediately if you have any unusual bleeding, trouble breathing, redness and pain at the surgery site, drainage, fever, or pain not relieved by medication.    4) Additional Instructions:        Please contact your physician with any problems or Same Day Surgery at [phone], Monday through Friday 6 am to 4 pm, or [place] at [facility] number at [phone].

## 2019-06-30 NOTE — Op Note (Signed)
Date June 30, 2019  time 3:37 PM   PATIENT:  Katie Harding   PRE-OPERATIVE DIAGNOSIS:  closed wedge compression fracture of T11   POST-OPERATIVE DIAGNOSIS:  closed wedge compression fracture of T11   PROCEDURE:  Procedure(s): KYPHOPLASTY T11  SURGEON: Laurene Footman, MD   ASSISTANTS: None   ANESTHESIA:   local and MAC   EBL:  No intake/output data recorded.   BLOOD ADMINISTERED:none   DRAINS: none    LOCAL MEDICATIONS USED:  MARCAINE    and XYLOCAINE    SPECIMEN:   T11 vertebral body biopsy   DISPOSITION OF SPECIMEN:  Pathology   COUNTS:  YES   TOURNIQUET:  * No tourniquets in log *   IMPLANTS: Bone cement   DICTATION: .Dragon Dictation  patient was brought to the operating room and after adequate anesthesia was obtained the patient was placed prone.  C arm was brought in in good visualization of the affected level obtained on both AP and lateral projections.  After patient identification and timeout procedures were completed, local anesthetic was infiltrated with 10 cc 1% Xylocaine infiltrated subcutaneously.  This is done the area on the each side of the planned approach.  The back was then prepped and draped in the usual sterile manner and repeat timeout procedure carried out.  A spinal needle was brought down to the pedicle on the each side of  T11 and a 50-50 mix of 1% Xylocaine half percent Sensorcaine with epinephrine total of 20 cc injected on each side.  After allowing this to set a small incision was made and the trocar was advanced into the vertebral body in an extrapedicular fashion.  Biopsy was obtained Drilling was carried out balloon inserted with inflation to  to cc on the right and across the midline so left-sided pedicle approach was not required..  When the cement was appropriate consistency 2 cc were injected on the right and 1-1/2 cc on the left into the vertebral body with extravasation into anterior veins, good fill superior to inferior endplates and from  right to left sides along the inferior endplate.  After the cement had set the trochar was removed and permanent C-arm views obtained.  The wound was closed with Dermabond followed by Band-Aid   PLAN OF CARE:  Discharge home after recovery room   PATIENT DISPOSITION:  PACU - hemodynamically stable.

## 2019-06-30 NOTE — Anesthesia Preprocedure Evaluation (Addendum)
Anesthesia Evaluation  Patient identified by MRN, date of birth, ID band Patient awake    Reviewed: Allergy & Precautions, NPO status , Patient's Chart, lab work & pertinent test results  History of Anesthesia Complications (+) PONV and history of anesthetic complications  Airway Mallampati: II       Dental   Pulmonary neg sleep apnea, neg COPD, Not current smoker,           Cardiovascular (-) hypertension(-) Past MI and (-) CHF (-) dysrhythmias (-) Valvular Problems/Murmurs     Neuro/Psych neg Seizures Anxiety    GI/Hepatic Neg liver ROS, GERD  Medicated and Controlled,  Endo/Other  neg diabetes  Renal/GU negative Renal ROS     Musculoskeletal   Abdominal   Peds  Hematology   Anesthesia Other Findings   Reproductive/Obstetrics                            Anesthesia Physical Anesthesia Plan  ASA: II  Anesthesia Plan: General   Post-op Pain Management:    Induction: Intravenous  PONV Risk Score and Plan: 4 or greater and Ondansetron, Dexamethasone, Propofol infusion and Treatment may vary due to age or medical condition  Airway Management Planned: Nasal Cannula  Additional Equipment:   Intra-op Plan:   Post-operative Plan:   Informed Consent: I have reviewed the patients History and Physical, chart, labs and discussed the procedure including the risks, benefits and alternatives for the proposed anesthesia with the patient or authorized representative who has indicated his/her understanding and acceptance.       Plan Discussed with:   Anesthesia Plan Comments:         Anesthesia Quick Evaluation

## 2019-06-30 NOTE — H&P (Signed)
Subjective:   Patient is a 79 y.o. female presents with severe mid back pain.  She has a history of recent T12 kyphoplasty with good relief and then had increasing pain.  MRI was ordered and this shows a new T11 fracture inferior endplate with significant deformity.  She has unrelenting pain not relieved with pain medication.  She is being admitted for T11 kyphoplasty as an outpatient  Patient Active Problem List   Diagnosis Date Noted  . Protein-calorie malnutrition, severe 01/30/2017  . Hyponatremia 01/28/2017  . Neuropathy 09/19/2016  . Urinary retention 09/19/2016  . Closed fracture of multiple pubic rami (Secret[facility]) 08/20/2016   Past Medical History:  Diagnosis Date  . #[id number] 2006  . Anxiety   . Arthritis    osteoporosis  . Breast cancer [facility]) 2006   right breast lumpectomy with radiation. no lymph nodes involved  . Colon polyp    adenoma  . Complication of anesthesia   . Constipation 04/2019   on a new bowel regime  . Dyspnea   . Dysrhythmia    ATRIAL FIBRILLATION  . Fracture of vertebra due to osteoporosis (HCC)    history of L1, L2 and now T12  . GERD (gastroesophageal reflux disease) 04/04/2015  . Heart disease   . History of gastric ulcer 04/2001  . History of right breast cancer    post surgery/ XRT  . MVP (mitral valve prolapse)    dental prophylaxis  . Osteoporosis   . Osteoporosis, post-menopausal   . Personal history of radiation therapy 2006   right breast ca  . PONV (postoperative nausea and vomiting)    NAUSEA ON SEVERAL OCCASIONS  . Scoliosis   . Surgical menopause     Past Surgical History:  Procedure Laterality Date  . BREAST BIOPSY Right 2006   breast ca  . BREAST LUMPECTOMY Right 2006   breast ca  . CATARACT EXTRACTION    . CHOLECYSTECTOMY    . 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 COLONOSCOPY  11/29/2004   PH Adenomatous Polyps, [facility] (Brother)  . COLONOSCOPY  03/14/2010   PH Adenomatous Polyps, [facility] (Brother);  CBF 03/2015, Ltr mailed 01/19/2015 (dw)  . COLONOSCOPY  05/05/2015   PH Adenomatous Polyps, [facility] (Brother); No repeat due to age per RTE (dw)  . COLONOSCOPY WITH PROPOFOL N/A 05/05/2015   Procedure: COLONOSCOPY WITH PROPOFOL;  Surgeon: Manya Silvas, MD;  Location: [facility] ENDOSCOPY;  Service: Endoscopy;  Laterality: N/A;  . ESOPHAGOGASTRODUODENOSCOPY  12/24/2006   01/06/2004, 08/27/2001, 06/18/2001  . ESOPHAGOGASTRODUODENOSCOPY (EGD) WITH PROPOFOL N/A 05/05/2015   Procedure: ESOPHAGOGASTRODUODENOSCOPY (EGD) WITH PROPOFOL;  Surgeon: Manya Silvas, MD;  Location: [facility] ENDOSCOPY;  Service: Endoscopy;  Laterality: N/A;  . EYE SURGERY Bilateral    cataracts removed  . KYPHOPLASTY N/A 06/08/2019   Procedure: T12 KYPHOPLASTY;  Surgeon: Hessie Knows, MD;  Location: [facility] ORS;  Service: Orthopedics;  Laterality: N/A;  . NASAL SEPTUM SURGERY    . NASAL SEPTUM SURGERY    . ovarian cyst removed    . ROTATOR CUFF REPAIR Right   . TONSILLECTOMY    . TONSILLECTOMY    . TOTAL ABDOMINAL HYSTERECTOMY W/ BILATERAL SALPINGOOPHORECTOMY  1980s   for endometriosis in 1980s    Medications Prior to Admission  Medication Sig Dispense Refill Last Dose  . acetaminophen (TYLENOL) 325 MG tablet Take 650 mg by mouth every 4 (four) hours as needed. For pain / increased temp Max dose for 24 hrs is 3000 mg from  all sources of Apap/tyenol     . ALPRAZolam (XANAX) 0.25 MG tablet Take 0.5 tablets (0.125 mg total) by mouth 4 (four) times daily. 60 tablet 1   . Amino Acids-Protein Hydrolys (FEEDING SUPPLEMENT, PRO-STAT SUGAR FREE 64,) LIQD Take 30 mLs by mouth 2 (two) times daily between meals.     . bisacodyl (DULCOLAX) 5 MG EC tablet Take 5 mg by mouth daily as needed for moderate constipation.     . calcium citrate-vitamin D (CITRACAL+D) 315-200 MG-UNIT tablet Take 2 tablets by mouth 2 (two) times daily.      . Cholecalciferol 1000 units capsule Take 2,000 Units by mouth daily. 2 caps     . co-enzyme Q-10 30 MG capsule Take 30  mg by mouth daily.     . [employer] FIBER GUMMIES PO Take by mouth.     . diltiazem (CARDIZEM LA) 120 MG 24 hr tablet Take 120 mg by mouth daily.     Marland Kitchen docusate sodium (COLACE) 100 MG capsule Take 100 mg by mouth 2 (two) times daily.     . magnesium oxide (MAG-OX) 400 MG tablet Take 400 mg by mouth 2 (two) times daily.     . metoCLOPramide (REGLAN) 5 MG tablet Take 5 mg by mouth at bedtime.      . metoprolol succinate (TOPROL XL) 50 MG 24 hr tablet Take 50 mg by mouth See admin instructions. Take 1/2 tablet (25 mg) q a.m., 1 whole tablet (50 mg)  at lunch, 1/2 tablet (67m)  with dinner, and 1/2 tablet (25 mg) at bedtime     . Multiple Vitamin (MULTIVITAMIN) tablet Take 1 tablet by mouth daily.     . Nutritional Supplements (ENSURE ENLIVE PO) Take 1 Bottle by mouth 2 (two) times daily between meals.     .Marland Kitchenomeprazole (PRILOSEC) 40 MG capsule Take 40 mg by mouth 2 (two) times daily.     .Marland KitchenoxyCODONE-acetaminophen (PERCOCET/ROXICET) 5-325 MG tablet Take by mouth every 4 (four) hours as needed for severe pain.     . polyethylene glycol (MIRALAX / GLYCOLAX) packet Take 17 g by mouth daily.      . propranolol (INDERAL) 40 MG tablet Take 40 mg by mouth 3 (three) times daily.     .Marland Kitchensenna (SENOKOT) 8.6 MG tablet Take 2 tablets by mouth 2 (two) times daily.       Allergies  Allergen Reactions  . Nsaids Other (See Comments)    Causes gastric burning, pain. History of gastric ulcer.  . Hydrochlorothiazide Other (See Comments)    CAUSED A SEVERE DROP IN POTASSIUM LEVELS REQUIRING HOSPITALIZATION    Social History   Tobacco Use  . Smoking status: Never Smoker  . Smokeless tobacco: Never Used  Substance Use Topics  . Alcohol use: No    Family History  Problem Relation Age of Onset  . Breast cancer Sister 672 . Multiple myeloma Sister   . Breast cancer Maternal Aunt 60  . Breast cancer Maternal Aunt 60  . Colon cancer Brother   . Intracerebral hemorrhage Mother   . CVA Mother   . Hypertension Mother    . Stroke Mother   . Basal cell carcinoma Mother   . Melanoma Neg Hx     Review of Systems Pertinent items are noted in HPI.  Objective:   No data found. No intake/output data recorded. No intake/output data recorded.    There were no vitals taken for this visit. General appearance: alert and appears stated  age Lungs: clear to auscultation bilaterally Heart: regular rate and rhythm, S1, S2 normal, no murmur, click, rub or gallop Tender to percussion at T11 with slight kyphotic deformity.  Data Review MRI shows new T11 fracture.  Assessment:   Active Problems:   * No active hospital problems. *  T11 compression fracture with severe pain Plan:   T11 kyphoplasty today

## 2019-06-30 NOTE — Anesthesia Procedure Notes (Signed)
Date/Time: 06/30/2019 2:55 PM Performed by: Doreen Salvage, CRNA Pre-anesthesia Checklist: Patient identified, Emergency Drugs available, Suction available and Patient being monitored Patient Re-evaluated:Patient Re-evaluated prior to induction Oxygen Delivery Method: Nasal cannula Induction Type: IV induction Dental Injury: Teeth and Oropharynx as per pre-operative assessment  Comments: Nasal cannula with etCO2 monitoring

## 2019-06-30 NOTE — Transfer of Care (Signed)
Immediate Anesthesia Transfer of Care Note  Patient: Katie Harding  Procedure(s) Performed: T 11 KYPHOPLASTY (N/A Back)  Patient Location: PACU  Anesthesia Type:MAC  Level of Consciousness: awake, alert  and oriented  Airway & Oxygen Therapy: Patient Spontanous Breathing and Patient connected to nasal cannula oxygen  Post-op Assessment: Report given to RN and Post -op Vital signs reviewed and stable  Post vital signs: Reviewed and stable  Last Vitals:  Vitals Value Taken Time  BP 144/85 06/30/19 1530  Temp 36.4 C 06/30/19 1530  Pulse 62 06/30/19 1531  Resp 14 06/30/19 1531  SpO2 100 % 06/30/19 1531  Vitals shown include unvalidated device data.  Last Pain:  Vitals:   06/30/19 1244  TempSrc: Tympanic  PainSc: 0-No pain         Complications: No apparent anesthesia complications

## 2019-06-30 NOTE — Anesthesia Postprocedure Evaluation (Signed)
Anesthesia Post Note  Patient: Katie Harding  Procedure(s) Performed: T 11 KYPHOPLASTY (N/A Back)  Patient location during evaluation: PACU Anesthesia Type: General Level of consciousness: awake and alert Pain management: pain level controlled Vital Signs Assessment: post-procedure vital signs reviewed and stable Respiratory status: spontaneous breathing and respiratory function stable Cardiovascular status: stable Anesthetic complications: no     Last Vitals:  Vitals:   06/30/19 1244 06/30/19 1530  BP: 117/61 (!) 144/85  Pulse: (!) 110 62  Resp: 16 15  Temp: (!) 36.1 C 36.4 C  SpO2: 100% 100%    Last Pain:  Vitals:   06/30/19 1530  TempSrc:   PainSc: 9                  Katie Harding K

## 2019-07-02 LAB — SURGICAL PATHOLOGY

## 2019-07-08 ENCOUNTER — Emergency Department: Payer: Medicare PPO

## 2019-07-08 ENCOUNTER — Other Ambulatory Visit: Payer: Self-pay

## 2019-07-08 ENCOUNTER — Observation Stay
Admission: EM | Admit: 2019-07-08 | Discharge: 2019-07-10 | Disposition: A | Discharge status: Home / Self Care | Payer: Medicare PPO | Attending: Internal Medicine | Admitting: Internal Medicine

## 2019-07-08 ENCOUNTER — Encounter: Payer: Self-pay | Admitting: Emergency Medicine

## 2019-07-08 ENCOUNTER — Observation Stay
Admit: 2019-07-08 | Discharge: 2019-07-08 | Disposition: A | Discharge status: Home / Self Care | Payer: Medicare PPO | Attending: Internal Medicine | Admitting: Internal Medicine

## 2019-07-08 DIAGNOSIS — Z86010 Personal history of colonic polyps: Secondary | ICD-10-CM | POA: Insufficient documentation

## 2019-07-08 DIAGNOSIS — Z9849 Cataract extraction status, unspecified eye: Secondary | ICD-10-CM | POA: Insufficient documentation

## 2019-07-08 DIAGNOSIS — E871 Hypo-osmolality and hyponatremia: Secondary | ICD-10-CM | POA: Diagnosis not present

## 2019-07-08 DIAGNOSIS — K219 Gastro-esophageal reflux disease without esophagitis: Secondary | ICD-10-CM | POA: Diagnosis present

## 2019-07-08 DIAGNOSIS — M419 Scoliosis, unspecified: Secondary | ICD-10-CM | POA: Insufficient documentation

## 2019-07-08 DIAGNOSIS — R079 Chest pain, unspecified: Secondary | ICD-10-CM | POA: Diagnosis present

## 2019-07-08 DIAGNOSIS — Z90710 Acquired absence of both cervix and uterus: Secondary | ICD-10-CM | POA: Insufficient documentation

## 2019-07-08 DIAGNOSIS — Z20822 Contact with and (suspected) exposure to covid-19: Secondary | ICD-10-CM | POA: Insufficient documentation

## 2019-07-08 DIAGNOSIS — E876 Hypokalemia: Secondary | ICD-10-CM | POA: Diagnosis not present

## 2019-07-08 DIAGNOSIS — Z923 Personal history of irradiation: Secondary | ICD-10-CM | POA: Diagnosis not present

## 2019-07-08 DIAGNOSIS — I509 Heart failure, unspecified: Secondary | ICD-10-CM | POA: Diagnosis not present

## 2019-07-08 DIAGNOSIS — M810 Age-related osteoporosis without current pathological fracture: Secondary | ICD-10-CM | POA: Insufficient documentation

## 2019-07-08 DIAGNOSIS — Z800 Family history of malignant neoplasm of digestive organs: Secondary | ICD-10-CM | POA: Insufficient documentation

## 2019-07-08 DIAGNOSIS — I4891 Unspecified atrial fibrillation: Secondary | ICD-10-CM | POA: Diagnosis not present

## 2019-07-08 DIAGNOSIS — J90 Pleural effusion, not elsewhere classified: Secondary | ICD-10-CM | POA: Insufficient documentation

## 2019-07-08 DIAGNOSIS — I110 Hypertensive heart disease with heart failure: Secondary | ICD-10-CM | POA: Diagnosis not present

## 2019-07-08 DIAGNOSIS — Z886 Allergy status to analgesic agent status: Secondary | ICD-10-CM | POA: Insufficient documentation

## 2019-07-08 DIAGNOSIS — I088 Other rheumatic multiple valve diseases: Secondary | ICD-10-CM | POA: Diagnosis not present

## 2019-07-08 DIAGNOSIS — K449 Diaphragmatic hernia without obstruction or gangrene: Secondary | ICD-10-CM | POA: Insufficient documentation

## 2019-07-08 DIAGNOSIS — Z808 Family history of malignant neoplasm of other organs or systems: Secondary | ICD-10-CM | POA: Insufficient documentation

## 2019-07-08 DIAGNOSIS — Z8249 Family history of ischemic heart disease and other diseases of the circulatory system: Secondary | ICD-10-CM | POA: Insufficient documentation

## 2019-07-08 DIAGNOSIS — R0600 Dyspnea, unspecified: Secondary | ICD-10-CM | POA: Diagnosis not present

## 2019-07-08 DIAGNOSIS — Z823 Family history of stroke: Secondary | ICD-10-CM | POA: Insufficient documentation

## 2019-07-08 DIAGNOSIS — I5033 Acute on chronic diastolic (congestive) heart failure: Secondary | ICD-10-CM | POA: Insufficient documentation

## 2019-07-08 DIAGNOSIS — Z8711 Personal history of peptic ulcer disease: Secondary | ICD-10-CM | POA: Diagnosis not present

## 2019-07-08 DIAGNOSIS — Z888 Allergy status to other drugs, medicaments and biological substances status: Secondary | ICD-10-CM | POA: Insufficient documentation

## 2019-07-08 DIAGNOSIS — I5031 Acute diastolic (congestive) heart failure: Secondary | ICD-10-CM

## 2019-07-08 DIAGNOSIS — Z79899 Other long term (current) drug therapy: Secondary | ICD-10-CM | POA: Diagnosis not present

## 2019-07-08 DIAGNOSIS — Z853 Personal history of malignant neoplasm of breast: Secondary | ICD-10-CM | POA: Insufficient documentation

## 2019-07-08 DIAGNOSIS — Z90722 Acquired absence of ovaries, bilateral: Secondary | ICD-10-CM | POA: Insufficient documentation

## 2019-07-08 DIAGNOSIS — Z7982 Long term (current) use of aspirin: Secondary | ICD-10-CM | POA: Diagnosis not present

## 2019-07-08 DIAGNOSIS — F419 Anxiety disorder, unspecified: Secondary | ICD-10-CM | POA: Diagnosis not present

## 2019-07-08 DIAGNOSIS — E041 Nontoxic single thyroid nodule: Secondary | ICD-10-CM | POA: Insufficient documentation

## 2019-07-08 DIAGNOSIS — Z803 Family history of malignant neoplasm of breast: Secondary | ICD-10-CM | POA: Insufficient documentation

## 2019-07-08 DIAGNOSIS — G96191 Perineural cyst: Secondary | ICD-10-CM | POA: Insufficient documentation

## 2019-07-08 DIAGNOSIS — M8580 Other specified disorders of bone density and structure, unspecified site: Secondary | ICD-10-CM | POA: Diagnosis not present

## 2019-07-08 DIAGNOSIS — M48061 Spinal stenosis, lumbar region without neurogenic claudication: Secondary | ICD-10-CM | POA: Diagnosis not present

## 2019-07-08 DIAGNOSIS — Z9011 Acquired absence of right breast and nipple: Secondary | ICD-10-CM | POA: Insufficient documentation

## 2019-07-08 LAB — BASIC METABOLIC PANEL
Anion gap: 13 (ref 5–15)
BUN: 20 mg/dL (ref 8–23)
CO2: 28 mmol/L (ref 22–32)
Calcium: 9.1 mg/dL (ref 8.9–10.3)
Chloride: 88 mmol/L — ABNORMAL LOW (ref 98–111)
Creatinine, Ser: 0.58 mg/dL (ref 0.44–1.00)
GFR calc Af Amer: >60 mL/min (ref >60)
GFR calc non Af Amer: >60 mL/min (ref >60)
Glucose, Bld: 76 mg/dL (ref 70–99)
Potassium: 3.4 mmol/L — ABNORMAL LOW (ref 3.5–5.1)
Sodium: 129 mmol/L — ABNORMAL LOW (ref 135–145)

## 2019-07-08 LAB — TROPONIN I (HIGH SENSITIVITY)
Troponin I (High Sensitivity): 4 ng/L (ref <18)
Troponin I (High Sensitivity): 4 ng/L (ref <18)

## 2019-07-08 LAB — CBC
HCT: 37.1 % (ref 36.0–46.0)
Hemoglobin: 12.6 g/dL (ref 12.0–15.0)
MCH: 30.4 pg (ref 26.0–34.0)
MCHC: 34 g/dL (ref 30.0–36.0)
MCV: 89.6 fL (ref 80.0–100.0)
Platelets: 313 10*3/uL (ref 150–400)
RBC: 4.14 MIL/uL (ref 3.87–5.11)
RDW: 14.4 % (ref 11.5–15.5)
WBC: 8.2 10*3/uL (ref 4.0–10.5)
nRBC: 0 % (ref 0.0–0.2)

## 2019-07-08 LAB — ECHOCARDIOGRAM COMPLETE
Height: 66 in
Weight: 1664 oz

## 2019-07-08 LAB — SARS CORONAVIRUS 2 BY RT PCR (HOSPITAL ORDER, PERFORMED IN ~~LOC~~ HOSPITAL LAB): SARS Coronavirus 2: NEGATIVE

## 2019-07-08 MED ORDER — ENOXAPARIN SODIUM 40 MG/0.4ML ~~LOC~~ SOLN
40.0000 mg | SUBCUTANEOUS | Status: DC
  Administered 2019-07-08 – 2019-07-09 (×2): 40 mg via SUBCUTANEOUS
  Filled 2019-07-08 (×2): qty 0.4

## 2019-07-08 MED ORDER — ATORVASTATIN CALCIUM 20 MG PO TABS
40.0000 mg | ORAL_TABLET | Freq: Every day | ORAL | Status: DC
  Administered 2019-07-08 – 2019-07-10 (×3): 40 mg via ORAL
  Filled 2019-07-08 (×3): qty 2

## 2019-07-08 MED ORDER — ACETAMINOPHEN 325 MG PO TABS: 650.0000 mg | ORAL_TABLET | ORAL | Status: DC | PRN

## 2019-07-08 MED ORDER — BISACODYL 5 MG PO TBEC: 5.0000 mg | DELAYED_RELEASE_TABLET | Freq: Every day | ORAL | Status: DC | PRN

## 2019-07-08 MED ORDER — ASPIRIN 81 MG PO CHEW
324.0000 mg | CHEWABLE_TABLET | ORAL | Status: AC
  Administered 2019-07-08: 324 mg via ORAL
  Filled 2019-07-08: qty 4

## 2019-07-08 MED ORDER — FAMOTIDINE 20 MG PO TABS
40.0000 mg | ORAL_TABLET | Freq: Every day | ORAL | Status: DC
  Administered 2019-07-08 – 2019-07-09 (×2): 40 mg via ORAL
  Filled 2019-07-08 (×2): qty 2

## 2019-07-08 MED ORDER — PANTOPRAZOLE SODIUM 40 MG PO TBEC
40.0000 mg | DELAYED_RELEASE_TABLET | Freq: Every day | ORAL | Status: DC
  Administered 2019-07-09 – 2019-07-10 (×2): 40 mg via ORAL
  Filled 2019-07-08 (×2): qty 1

## 2019-07-08 MED ORDER — FUROSEMIDE 10 MG/ML IJ SOLN: 40.0000 mg | Freq: Every day | INTRAMUSCULAR | Status: DC

## 2019-07-08 MED ORDER — POTASSIUM CHLORIDE CRYS ER 20 MEQ PO TBCR
40.0000 meq | EXTENDED_RELEASE_TABLET | Freq: Every day | ORAL | Status: DC
  Administered 2019-07-08 – 2019-07-10 (×3): 40 meq via ORAL
  Filled 2019-07-08 (×3): qty 2

## 2019-07-08 MED ORDER — COENZYME Q10 30 MG PO CAPS: 30.0000 mg | ORAL_CAPSULE | Freq: Every day | ORAL | Status: DC

## 2019-07-08 MED ORDER — FUROSEMIDE 10 MG/ML IJ SOLN
40.0000 mg | Freq: Every day | INTRAMUSCULAR | Status: DC
  Administered 2019-07-08 – 2019-07-09 (×2): 40 mg via INTRAVENOUS
  Filled 2019-07-08 (×2): qty 4

## 2019-07-08 MED ORDER — ADULT MULTIVITAMIN W/MINERALS CH
1.0000 | ORAL_TABLET | Freq: Every day | ORAL | Status: DC
  Administered 2019-07-08 – 2019-07-10 (×3): 1 via ORAL
  Filled 2019-07-08 (×3): qty 1

## 2019-07-08 MED ORDER — SUCRALFATE 1 G PO TABS
1.0000 g | ORAL_TABLET | Freq: Three times a day (TID) | ORAL | Status: DC
  Administered 2019-07-08 – 2019-07-10 (×6): 1 g via ORAL
  Filled 2019-07-08 (×6): qty 1

## 2019-07-08 MED ORDER — METOCLOPRAMIDE HCL 10 MG PO TABS
5.0000 mg | ORAL_TABLET | Freq: Three times a day (TID) | ORAL | Status: DC
  Administered 2019-07-08 – 2019-07-10 (×6): 5 mg via ORAL
  Filled 2019-07-08 (×6): qty 1

## 2019-07-08 MED ORDER — DILTIAZEM HCL ER COATED BEADS 120 MG PO TB24
120.0000 mg | ORAL_TABLET | Freq: Every day | ORAL | Status: DC
  Administered 2019-07-08 – 2019-07-10 (×3): 120 mg via ORAL
  Filled 2019-07-08 (×5): qty 1

## 2019-07-08 MED ORDER — ONDANSETRON HCL 4 MG/2ML IJ SOLN: 4.0000 mg | Freq: Four times a day (QID) | INTRAMUSCULAR | Status: DC | PRN

## 2019-07-08 MED ORDER — HYDROCODONE-ACETAMINOPHEN 5-325 MG PO TABS: 1.0000 | ORAL_TABLET | Freq: Once | ORAL | Status: DC

## 2019-07-08 MED ORDER — POLYETHYLENE GLYCOL 3350 17 G PO PACK
17.0000 g | PACK | Freq: Every day | ORAL | Status: DC
  Administered 2019-07-08 – 2019-07-10 (×3): 17 g via ORAL
  Filled 2019-07-08 (×3): qty 1

## 2019-07-08 MED ORDER — ASPIRIN EC 81 MG PO TBEC
81.0000 mg | DELAYED_RELEASE_TABLET | Freq: Every day | ORAL | Status: DC
  Administered 2019-07-09 – 2019-07-10 (×2): 81 mg via ORAL
  Filled 2019-07-08 (×2): qty 1

## 2019-07-08 MED ORDER — NITROGLYCERIN 0.4 MG SL SUBL: 0.4000 mg | SUBLINGUAL_TABLET | SUBLINGUAL | Status: DC | PRN

## 2019-07-08 MED ORDER — HYDROCODONE-ACETAMINOPHEN 7.5-325 MG PO TABS
1.0000 | ORAL_TABLET | Freq: Four times a day (QID) | ORAL | Status: DC | PRN
  Administered 2019-07-08: 1 via ORAL
  Filled 2019-07-08: qty 1

## 2019-07-08 MED ORDER — HYDROCODONE-ACETAMINOPHEN 7.5-325 MG PO TABS
1.0000 | ORAL_TABLET | Freq: Four times a day (QID) | ORAL | Status: DC | PRN
  Administered 2019-07-09 – 2019-07-10 (×4): 1 via ORAL
  Filled 2019-07-08 (×4): qty 1

## 2019-07-08 MED ORDER — ASPIRIN 300 MG RE SUPP: 300.0000 mg | RECTAL | Status: AC

## 2019-07-08 MED ORDER — ALPRAZOLAM 0.25 MG PO TABS
0.1250 mg | ORAL_TABLET | Freq: Four times a day (QID) | ORAL | Status: DC
  Administered 2019-07-08 – 2019-07-10 (×7): 0.125 mg via ORAL
  Filled 2019-07-08 (×7): qty 1

## 2019-07-08 MED ORDER — PROPRANOLOL HCL 40 MG PO TABS
40.0000 mg | ORAL_TABLET | Freq: Three times a day (TID) | ORAL | Status: DC
  Administered 2019-07-08 – 2019-07-10 (×6): 40 mg via ORAL
  Filled 2019-07-08 (×6): qty 1
  Filled 2019-07-08: qty 2
  Filled 2019-07-08: qty 1

## 2019-07-08 MED ORDER — IOHEXOL 350 MG/ML SOLN
60.0000 mL | Freq: Once | INTRAVENOUS | Status: AC | PRN
  Administered 2019-07-08: 60 mL via INTRAVENOUS

## 2019-07-08 MED ORDER — ACETAMINOPHEN 325 MG PO TABS: 650.0000 mg | ORAL_TABLET | Freq: Four times a day (QID) | ORAL | Status: DC | PRN

## 2019-07-08 MED ORDER — VITAMIN D 25 MCG (1000 UNIT) PO TABS
2000.0000 [IU] | ORAL_TABLET | Freq: Every day | ORAL | Status: DC
  Administered 2019-07-08 – 2019-07-10 (×3): 2000 [IU] via ORAL
  Filled 2019-07-08 (×3): qty 2

## 2019-07-08 MED ORDER — IOHEXOL 300 MG/ML  SOLN: 60.0000 mL | Freq: Once | INTRAMUSCULAR | Status: DC | PRN

## 2019-07-08 MED ORDER — MAGNESIUM OXIDE 400 (241.3 MG) MG PO TABS
400.0000 mg | ORAL_TABLET | Freq: Two times a day (BID) | ORAL | Status: DC
  Administered 2019-07-08 – 2019-07-10 (×5): 400 mg via ORAL
  Filled 2019-07-08 (×5): qty 1

## 2019-07-08 NOTE — ED Notes (Signed)
Pt ambulatory to toilet with 1 staff assistance and cane at bedside.

## 2019-07-08 NOTE — ED Notes (Signed)
Pt to CT

## 2019-07-08 NOTE — Consult Note (Signed)
[facility] Consultation Note  Patient ID: KAAREN NASS, MRN: [id number], DOB/AGE: [birth year] 79 y.o. Admit date: 07/08/2019   Date of Consult: 07/08/2019 Primary Physician: Rusty Aus, MD Primary Cardiologist: None   Chief Complaint:  Chief Complaint  Patient presents with  . Chest Pain   Reason for Consult: Diastolic dysfunction congestive heart failure  HPI: 79 y.o. female with known chronic diastolic dysfunction congestive heart failure hyponatremia from hydrochlorothiazide use in the past significant scoliosis with recent kyphoplasty having significant amount of chest discomfort.  It appears that this chest discomfort is reproducible with chest manipulation and related to her kyphoplasty.  She has not had any progression of this with or without shortness of breath.  The patient does have an EKG showing normal sinus rhythm with preventricular contractions.  Troponin level is 4.  Chest x-ray shows some vascular congestion with small pleural effusions.  Echocardiogram shows normal LV systolic function with ejection fraction of 65% and mild valvular heart disease.  Currently she is comfortable with this chest pain and has no progression of issues on appropriate medication management for hypertension control including diltiazem and propranolol.  The patient does have significant lower extremity edema as well which could be caused by diltiazem use.  Patient is not very active at this time.  She will need to have further treatment of diastolic dysfunction heart failure  Past Medical History:  Diagnosis Date  . #[id number] 2006  . Anxiety   . Arthritis    osteoporosis  . Breast cancer [facility]) 2006   right breast lumpectomy with radiation. no lymph nodes involved  . Colon polyp    adenoma  . Complication of anesthesia   . Constipation 04/2019   on a new bowel regime  . Dyspnea   . Dysrhythmia    ATRIAL FIBRILLATION  . Fracture of vertebra due to osteoporosis (HCC)     history of L1, L2 and now T12  . GERD (gastroesophageal reflux disease) 04/04/2015  . Heart disease   . History of gastric ulcer 04/2001  . History of right breast cancer    post surgery/ XRT  . MVP (mitral valve prolapse)    dental prophylaxis  . Osteoporosis   . Osteoporosis, post-menopausal   . Personal history of radiation therapy 2006   right breast ca  . PONV (postoperative nausea and vomiting)    NAUSEA ON SEVERAL OCCASIONS  . Scoliosis   . Surgical menopause       Surgical History:  Past Surgical History:  Procedure Laterality Date  . BREAST BIOPSY Right 2006   breast ca  . BREAST LUMPECTOMY Right 2006   breast ca  . CATARACT EXTRACTION    . CHOLECYSTECTOMY    . 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 COLONOSCOPY  11/29/2004   PH Adenomatous Polyps, [facility] (Brother)  . COLONOSCOPY  03/14/2010   PH Adenomatous Polyps, [facility] (Brother); CBF 03/2015, Ltr mailed 01/19/2015 (dw)  . COLONOSCOPY  05/05/2015   PH Adenomatous Polyps, [facility] (Brother); No repeat due to age per RTE (dw)  . COLONOSCOPY WITH PROPOFOL N/A 05/05/2015   Procedure: COLONOSCOPY WITH PROPOFOL;  Surgeon: Manya Silvas, MD;  Location: [facility] ENDOSCOPY;  Service: Endoscopy;  Laterality: N/A;  . ESOPHAGOGASTRODUODENOSCOPY  12/24/2006   01/06/2004, 08/27/2001, 06/18/2001  . ESOPHAGOGASTRODUODENOSCOPY (EGD) WITH PROPOFOL N/A 05/05/2015   Procedure: ESOPHAGOGASTRODUODENOSCOPY (EGD) WITH PROPOFOL;  Surgeon: Manya Silvas, MD;  Location: [facility] ENDOSCOPY;  Service: Endoscopy;  Laterality: N/A;  .  EYE SURGERY Bilateral    cataracts removed  . KYPHOPLASTY N/A 06/08/2019   Procedure: T12 KYPHOPLASTY;  Surgeon: Hessie Knows, MD;  Location: [facility] ORS;  Service: Orthopedics;  Laterality: N/A;  . KYPHOPLASTY N/A 06/30/2019   Procedure: T 11 KYPHOPLASTY;  Surgeon: Hessie Knows, MD;  Location: [facility] ORS;  Service: Orthopedics;  Laterality: N/A;  . NASAL SEPTUM SURGERY    . NASAL SEPTUM SURGERY    .  ovarian cyst removed    . ROTATOR CUFF REPAIR Right   . TONSILLECTOMY    . TONSILLECTOMY    . TOTAL ABDOMINAL HYSTERECTOMY W/ BILATERAL SALPINGOOPHORECTOMY  1980s   for endometriosis in 1980s     Home Meds: Prior to Admission medications   Medication Sig Start Date End Date Taking? Authorizing Provider  acetaminophen (TYLENOL) 325 MG tablet Take 650 mg by mouth every 4 (four) hours as needed. For pain / increased temp Max dose for 24 hrs is 3000 mg from all sources of Apap/tyenol   Yes [provider]  ALPRAZolam (XANAX) 0.25 MG tablet Take 0.5 tablets (0.125 mg total) by mouth 4 (four) times daily. 08/24/16  Yes Toni Arthurs, NP  Amino Acids-Protein Hydrolys (FEEDING SUPPLEMENT, PRO-STAT SUGAR FREE 64,) LIQD Take 30 mLs by mouth 2 (two) times daily between meals.   Yes [provider]  bisacodyl (DULCOLAX) 5 MG EC tablet Take 5 mg by mouth daily as needed for moderate constipation.   Yes [provider]  calcium citrate-vitamin D (CITRACAL+D) 315-200 MG-UNIT tablet Take 2 tablets by mouth 2 (two) times daily.    Yes [provider]  Cholecalciferol 1000 units capsule Take 2,000 Units by mouth daily. 2 caps   Yes [provider]  co-enzyme Q-10 30 MG capsule Take 30 mg by mouth daily.   Yes [provider]  diltiazem (CARDIZEM LA) 120 MG 24 hr tablet Take 120 mg by mouth daily.   Yes [provider]  HYDROcodone-acetaminophen (NORCO) 7.5-325 MG tablet Take 1 tablet by mouth every 4 (four) hours as needed. 06/30/19  Yes Hessie Knows, MD  indapamide (LOZOL) 1.25 MG tablet Take 1.25 mg by mouth daily as needed. 06/18/19  Yes [provider]  magnesium oxide (MAG-OX) 400 MG tablet Take 400 mg by mouth 2 (two) times daily.   Yes [provider]  metoCLOPramide (REGLAN) 5 MG tablet Take 5 mg by mouth 3 (three) times daily. 06/01/19 07/08/19 Yes [provider]  Multiple Vitamin (MULTIVITAMIN) tablet Take 1 tablet by  mouth daily.   Yes [provider]  omeprazole (PRILOSEC) 40 MG capsule Take 40 mg by mouth 2 (two) times daily.   Yes [provider]  polyethylene glycol (MIRALAX / GLYCOLAX) packet Take 17 g by mouth daily.    Yes [provider]  propranolol (INDERAL) 40 MG tablet Take 40 mg by mouth 3 (three) times daily.   Yes [provider]  sucralfate (CARAFATE) 1 g tablet Take 1 g by mouth 3 (three) times daily. 04/16/19  Yes [provider]  famotidine (PEPCID) 40 MG tablet Take 40 mg by mouth at bedtime. 07/03/19   [provider]    Inpatient Medications:  . ALPRAZolam  0.125 mg Oral QID  . [START ON 07/09/2019] aspirin EC  81 mg Oral Daily  . atorvastatin  40 mg Oral Daily  . cholecalciferol  2,000 Units Oral Daily  . diltiazem  120 mg Oral Daily  . enoxaparin (LOVENOX) injection  40 mg Subcutaneous Q24H  .  famotidine  40 mg Oral QHS  . furosemide  40 mg Intravenous Daily  . magnesium oxide  400 mg Oral BID  . metoCLOPramide  5 mg Oral TID  . multivitamin with minerals  1 tablet Oral Daily  . [START ON 07/09/2019] pantoprazole  40 mg Oral Daily  . polyethylene glycol  17 g Oral Daily  . potassium chloride  40 mEq Oral Daily  . propranolol  40 mg Oral TID  . sucralfate  1 g Oral TID     Allergies:  Allergies  Allergen Reactions  . Nsaids Other (See Comments)    Causes gastric burning, pain. History of gastric ulcer.  . Hydrochlorothiazide Other (See Comments)    CAUSED A SEVERE DROP IN POTASSIUM LEVELS REQUIRING HOSPITALIZATION    Social History   Socioeconomic History  . Marital status: Married    Spouse name: Lynann Bologna  . Number of children: 0  . Years of education: Doctoral Degree  . Highest education level: Not on file  Occupational History  . 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 Smoking status: Never Smoker  . Smokeless tobacco: Never Used   Substance and Sexual Activity  . Alcohol use: No  . Drug use: No  . Sexual activity: Not Currently  Other Topics Concern  . Not on file  Social History Narrative   Patient lives with husband.  They have 24 hour care for him d/t dementia and patient not able to help him if he falls.   They live at [facility] and have support through their Commerce.   Social Determinants of Health   Financial Resource Strain:   . Difficulty of Paying Living Expenses:   Food Insecurity:   . Worried About Charity fundraiser in the Last Year:   . Arboriculturist in the Last Year:   Transportation Needs:   . Film/video editor (Medical):   Marland Kitchen Lack of Transportation (Non-Medical):   Physical Activity:   . Days of Exercise per Week:   . Minutes of Exercise per Session:   Stress:   . Feeling of Stress :   Social Connections:   . Frequency of Communication with Friends and Family:   . Frequency of Social Gatherings with Friends and Family:   . Attends Religious Services:   . Active Member of Clubs or Organizations:   . Attends Archivist Meetings:   Marland Kitchen Marital Status:   Intimate Partner Violence:   . Fear of Current or Ex-Partner:   . Emotionally Abused:   Marland Kitchen Physically Abused:   . Sexually Abused:      Family History  Problem Relation Age of Onset  . Breast cancer Sister 60  . Multiple myeloma Sister   . Breast cancer Maternal Aunt 60  . Breast cancer Maternal Aunt 60  . Colon cancer Brother   . Intracerebral hemorrhage Mother   . CVA Mother   . Hypertension Mother   . Stroke Mother   . Basal cell carcinoma Mother   . Melanoma Neg Hx      Review of Systems Positive for shortness of breath chest pain lower extremity edema Negative for: General:  chills, fever, night sweats or weight changes.  Cardiovascular: PND orthopnea syncope dizziness  Dermatological skin lesions rashes Respiratory: Cough congestion Urologic: Frequent urination urination at night and  hematuria Abdominal: negative for nausea, vomiting, diarrhea, bright red blood per rectum,  melena, or hematemesis Neurologic: negative for visual changes, and/or hearing changes  All other systems reviewed and are otherwise negative except as noted above.  Labs: No results for input(s): CKTOTAL, CKMB, TROPONINI in the last 72 hours. Lab Results  Component Value Date   WBC 8.2 07/08/2019   HGB 12.6 07/08/2019   HCT 37.1 07/08/2019   MCV 89.6 07/08/2019   PLT 313 07/08/2019    Recent Labs  Lab 07/08/19 0917  NA 129*  K 3.4*  CL 88*  CO2 28  BUN 20  CREATININE 0.58  CALCIUM 9.1  GLUCOSE 76   No results found for: CHOL, HDL, LDLCALC, TRIG No results found for: DDIMER  Radiology/Studies:  DG Chest 2 View  Result Date: 07/08/2019 CLINICAL DATA:  Chest pain with palpitations. Shortness of breath. Recent kyphoplasty. EXAM: CHEST - 2 VIEW COMPARISON:  06/01/2019 FINDINGS: Normal heart size. Small bilateral pleural effusions with pulmonary venous congestion noted. Diffuse coarsened interstitial markings are identified bilaterally. On the lateral radiograph there is a 4.1 cm nodular opacity in the expected location of the lingula. On 03/18/2019 abdominal CT a suspicious nodular opacity was identified in this area. Diffuse osteopenia. Multi level lower thoracic and lumbar spine treated compression deformities identified. IMPRESSION: 1. Pulmonary venous congestion and small bilateral pleural effusions. 2. Indeterminate nodular opacity in the expected location of the lingula. A suspicious nodular density measuring 1.5 cm was noted on recent CT of the abdomen and pelvis dated 03/18/2019. Recommend further evaluation with CT of the chest to re-evaluate previously noted pulmonary nodule and exclude underlying malignancy. Electronically Signed   By: Kerby Moors M.D.   On: 07/08/2019 09:55   DG Thoracic Spine 2 View  Result Date: 06/30/2019 CLINICAL DATA:  T11 kyphoplasty EXAM: THORACIC SPINE 2  VIEWS; DG C-ARM 1-60 MIN COMPARISON:  MRI 06/23/2019 FINDINGS: Single intraoperative spot image demonstrates multi level vertebroplasty changes, with new vertebroplasty at T11. No visible complicating feature. IMPRESSION: As above. Electronically Signed   By: Rolm Baptise M.D.   On: 06/30/2019 17:34   CT Angio Chest PE W and/or Wo Contrast  Result Date: 07/08/2019 CLINICAL DATA:  Shortness of breath and chest pain post surgery question pulmonary embolism, past history kyphoplasty, RIGHT breast cancer at, mitral valve prolapse, GERD EXAM: CT ANGIOGRAPHY CHEST WITH CONTRAST TECHNIQUE: Multidetector CT imaging of the chest was performed using the standard protocol during bolus administration of intravenous contrast. Multiplanar CT image reconstructions and MIPs were obtained to evaluate the vascular anatomy. CONTRAST:  51m OMNIPAQUE IOHEXOL 350 MG/ML SOLN IV COMPARISON:  03/21/2016 FINDINGS: Cardiovascular: Mild enlargement of cardiac chambers. Atherosclerotic calcification aorta without aneurysm. No pericardial effusion. Pulmonary arteries adequately opacified and patent. No evidence of pulmonary embolism. Mediastinum/Nodes: Large hiatal hernia. 7 mm RIGHT thyroid nodule; Not clinically significant; no follow-up imaging recommended (ref: J Am Coll Radiol. 2015 Feb;12(2): 143-50).Base of cervical region otherwise normal appearance. No thoracic adenopathy. Post RIGHT mastectomy. Lungs/Pleura: BILATERAL small pleural effusions, RIGHT greater than LEFT. Compressive atelectasis of adjacent RIGHT lower lobe. Multiple pulmonary nodules less than 1 cm diameter suspicious for pulmonary metastatic disease. Scarring at base of lingula and at both apices greater on RIGHT. Additional scarring at base of lingula. Few calcified pulmonary granulomata noted. No infiltrate or pneumothorax. Upper Abdomen: Visualized upper abdomen unremarkable Musculoskeletal: Osseous demineralization with prior spinal augmentation procedures of 4  adjacent vertebral compression fractures at the thoracolumbar junction. Review of the MIP images confirms the above findings. IMPRESSION: No evidence of pulmonary embolism. New BILATERAL pulmonary  nodules < 1 cm diameter worrisome for pulmonary metastases. Small BILATERAL pleural effusions and minimal adjacent RIGHT lower lobe atelectasis. Large hiatal hernia. Aortic Atherosclerosis (ICD10-I70.0). Electronically Signed   By: Lavonia Dana M.D.   On: 07/08/2019 11:33   MR THORACIC SPINE WO CONTRAST  Result Date: 06/23/2019 CLINICAL DATA:  Thoracic compression fracture. History of T12 kyphoplasty on 06/08/2019. EXAM: MRI THORACIC SPINE WITHOUT CONTRAST TECHNIQUE: Multiplanar, multisequence MR imaging of the thoracic spine was performed. No intravenous contrast was administered. COMPARISON:  Lumbar spine MRI 06/01/2019. FINDINGS: Alignment: Grade 1 anterolisthesis of C7 on T1 and T1 on T2. Mild left convex curvature of the thoracic spine. Vertebrae: New T11 inferior endplate compression fracture with mild-to-moderate marrow edema and 15% vertebral body height loss without retropulsion. Interval augmentation of the T12 compression fracture with small volume cement in the ventral epidural space primarily left of midline without associated spinal stenosis. Persistent marrow edema diffusely throughout the T12 vertebral body. Chronic, previously augmented L1, L2, and L3 compression fractures. Subcentimeter stir hyperintense foci in the T5 and T6 vertebral bodies, possibly atypical hemangiomas. Scattered perineural cysts including a 2.5 cm cyst in the left neural foramen at T10-11. Cord:  Normal signal and morphology. Paraspinal and other soft tissues: 6 mm T2 hyperintense right thyroid nodule (not clinically significant; no follow-up imaging recommended). Trace right pleural effusion. Mild paravertebral soft tissue edema at T11-12. Disc levels: The thoracic spinal canal is capacious, and there is mild thoracic spondylosis  without spinal stenosis, spinal cord mass effect, or compressive neural foraminal stenosis. There is mild spinal stenosis at L1 due to vertebral retropulsion as described on the recent lumbar MRI. IMPRESSION: 1. Acute T11 compression fracture with 15% height loss and no retropulsion. 2. Interval augmentation of T12 compression fracture. 3. Chronic L1, L2, and L3 compression fractures. Electronically Signed   By: Logan Bores M.D.   On: 06/23/2019 16:11   US Venous Img Lower Bilateral  Result Date: 07/08/2019 CLINICAL DATA:  Bilateral leg swelling and shortness of breath EXAM: BILATERAL LOWER EXTREMITY VENOUS DOPPLER ULTRASOUND TECHNIQUE: Gray-scale sonography with graded compression, as well as color Doppler and duplex ultrasound were performed to evaluate the lower extremity deep venous systems from the level of the common femoral vein and including the common femoral, femoral, profunda femoral, popliteal and calf veins including the posterior tibial, peroneal and gastrocnemius veins when visible. The superficial great saphenous vein was also interrogated. Spectral Doppler was utilized to evaluate flow at rest and with distal augmentation maneuvers in the common femoral, femoral and popliteal veins. COMPARISON:  None. FINDINGS: RIGHT LOWER EXTREMITY Common Femoral Vein: No evidence of thrombus. Normal compressibility, respiratory phasicity and response to augmentation. Saphenofemoral Junction: No evidence of thrombus. Normal compressibility and flow on color Doppler imaging. Profunda Femoral Vein: No evidence of thrombus. Normal compressibility and flow on color Doppler imaging. Femoral Vein: No evidence of thrombus. Normal compressibility, respiratory phasicity and response to augmentation. Popliteal Vein: No evidence of thrombus. Normal compressibility, respiratory phasicity and response to augmentation. Calf Veins: No evidence of thrombus. Normal compressibility and flow on color Doppler imaging. LEFT LOWER  EXTREMITY Common Femoral Vein: No evidence of thrombus. Normal compressibility, respiratory phasicity and response to augmentation. Saphenofemoral Junction: No evidence of thrombus. Normal compressibility and flow on color Doppler imaging. Profunda Femoral Vein: No evidence of thrombus. Normal compressibility and flow on color Doppler imaging. Femoral Vein: No evidence of thrombus. Normal compressibility, respiratory phasicity and response to augmentation. Popliteal Vein: No evidence of thrombus. Normal compressibility,  respiratory phasicity and response to augmentation. Calf Veins: No evidence of thrombus. Normal compressibility and flow on color Doppler imaging. IMPRESSION: No evidence of deep venous thrombosis in either lower extremity. Electronically Signed   By: Jerilynn Mages.  Shick M.D.   On: 07/08/2019 11:09   DG C-Arm 1-60 Min  Result Date: 06/30/2019 CLINICAL DATA:  T11 kyphoplasty EXAM: THORACIC SPINE 2 VIEWS; DG C-ARM 1-60 MIN COMPARISON:  MRI 06/23/2019 FINDINGS: Single intraoperative spot image demonstrates multi level vertebroplasty changes, with new vertebroplasty at T11. No visible complicating feature. IMPRESSION: As above. Electronically Signed   By: Rolm Baptise M.D.   On: 06/30/2019 17:34    EKG: Normal sinus rhythm otherwise occasional preventricular contraction  Weights: Filed Weights   07/08/19 0855  Weight: 47.2 kg     Physical Exam: Blood pressure 105/61, pulse (!) 56, temperature 98.6 F (37 C), temperature source Oral, resp. rate 12, height _0  (1.676 m), weight 47.2 kg, SpO2 96 %. Body mass index is 16.79 kg/m. General: Well developed, well nourished, in no acute distress. Head eyes ears nose throat: Normocephalic, atraumatic, sclera non-icteric, no xanthomas, nares are without discharge. No apparent thyromegaly and/or mass  Lungs: Normal respiratory effort.  no wheezes, few basilar rales, no rhonchi.  Heart: RRR with normal S1 S2. no murmur gallop, no rub, PMI is normal size  and placement, carotid upstroke normal without bruit, jugular venous pressure is normal Abdomen: Soft, non-tender, non-distended with normoactive bowel sounds. No hepatomegaly. No rebound/guarding. No obvious abdominal masses. Abdominal aorta is normal size without bruit Extremities: 1+ edema. no cyanosis, no clubbing, no ulcers  Peripheral : 2+ bilateral upper extremity pulses, 2+ bilateral femoral pulses, 2+ bilateral dorsal pedal pulse Neuro: Alert and oriented. No facial asymmetry. No focal deficit. Moves all extremities spontaneously. Musculoskeletal: Normal muscle tone without kyphosis Psych:  Responds to questions appropriately with a normal affect.    Assessment: 79 year old female with acute on chronic diastolic dysfunction congestive heart failure with pulmonary edema and pleural effusions and lower extremity edema and no current evidence of myocardial infarction despite musculoskeletal chest discomfort  Plan: 1.  Begin intravenous furosemide for pulmonary edema lower extremity edema and pleural effusions 2.  Okay to change to oral Lasix tomorrow if improved 3.  No further cardiac diagnostics necessary at this time due to no evidence of myocardial infarction 4.  No further intervention or treatment of chest pain most consistent with musculoskeletal abnormalities 5.  Begin ambulation in a.m. and if ambulating well with no further significant symptoms okay for discharge to home on furosemide for heart failure and follow-up in 1 to 2 weeks for further adjustments of medication management  Signed, Corey Skains M.D. [facility] 07/08/2019, 8:09 PM

## 2019-07-08 NOTE — ED Triage Notes (Signed)
Arrived from doctor office for EKG changes.  Pt has had central chest pain worse with palpation and movement. C/o SHOB as well. Unlabored at this time.

## 2019-07-08 NOTE — Progress Notes (Signed)
PHARMACIST - PHYSICIAN ORDER COMMUNICATION  CONCERNING: P&T Medication Policy on Herbal Medications  DESCRIPTION:  This patient's order for:  co-enzyme Q-10 capsule 30 mg  has been noted.  This product(s) is classified as an "herbal" or natural product. Due to a lack of definitive safety studies or FDA approval, nonstandard manufacturing practices, plus the potential risk of unknown drug-drug interactions while on inpatient medications, the Pharmacy and Therapeutics Committee does not permit the use of "herbal" or natural products of this type within [facility].   ACTION TAKEN: The pharmacy department is unable to verify this order at this time.  Please reevaluate patient's clinical condition at discharge and address if the herbal or natural product(s) should be resumed at that time.  Pernell Dupre, PharmD, BCPS Clinical Pharmacist 07/08/2019 12:30 PM

## 2019-07-08 NOTE — ED Notes (Signed)
Pt given lunch

## 2019-07-08 NOTE — H&P (Signed)
History and Physical    Katie Harding TKZ:[id number] DOB: [birth year] DOA: 07/08/2019  PCP: Rusty Aus, MD   Patient coming from: Home  I have personally briefly reviewed patient's old medical records in [place]  Chief Complaint: Chest pain  HPI: Katie Harding is a 79 y.o. female with medical history significant for recent kyphoplasty (about a week postop), history of chronic diastolic dysfunction CHF who was sent to the emergency room by her primary care provider for evaluation of midsternal nonradiating chest pain which she has had for about 5 days.  She rates her pain a 5 x 10 in intensity at its worst associated with shortness of breath and discomfort with deep inspiration.  She denies having any nausea or vomiting, denies diaphoresis or palpitations.  She went to see her primary care provider for evaluation of this pain and had an EKG which showed some nonspecific T wave abnormalities in the lateral leads so she was sent to the ER.  Patient also states that she continues to gain weight despite being compliant with her diuretic therapy.  She denies having any dietary indiscretion. She had a CT angiogram of the chest which was negative for PE but showed a large hiatal hernia as well as small bilateral pleural effusions and minimal right lower lobe atelectasis. Lower extremity ultrasound was negative for DVT Labs reveal a potassium of 3.4 and sodium of 129   ED Course: Patient is an 79 year old female who was sent to the emergency room for evaluation of midsternal nonradiating chest pain with EKG changes.  Her troponin is negative.  CT scan of the chest shows bilateral pleural effusion and patient admits to weight gain.  She will be admitted to the hospital for further evaluation  Review of Systems: As per HPI otherwise 10 point review of systems negative.   Past Medical History:  Diagnosis Date  . #[id number] 2006  . Anxiety   . Arthritis    osteoporosis  . Breast cancer  [facility]) 2006   right breast lumpectomy with radiation. no lymph nodes involved  . Colon polyp    adenoma  . Complication of anesthesia   . Constipation 04/2019   on a new bowel regime  . Dyspnea   . Dysrhythmia    ATRIAL FIBRILLATION  . Fracture of vertebra due to osteoporosis (HCC)    history of L1, L2 and now T12  . GERD (gastroesophageal reflux disease) 04/04/2015  . Heart disease   . History of gastric ulcer 04/2001  . History of right breast cancer    post surgery/ XRT  . MVP (mitral valve prolapse)    dental prophylaxis  . Osteoporosis   . Osteoporosis, post-menopausal   . Personal history of radiation therapy 2006   right breast ca  . PONV (postoperative nausea and vomiting)    NAUSEA ON SEVERAL OCCASIONS  . Scoliosis   . Surgical menopause     Past Surgical History:  Procedure Laterality Date  . BREAST BIOPSY Right 2006   breast ca  . BREAST LUMPECTOMY Right 2006   breast ca  . CATARACT EXTRACTION    . CHOLECYSTECTOMY    . 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 COLONOSCOPY  11/29/2004   PH Adenomatous Polyps, [facility] (Brother)  . COLONOSCOPY  03/14/2010   PH Adenomatous Polyps, [facility] (Brother); CBF 03/2015, Ltr mailed 01/19/2015 (dw)  . COLONOSCOPY  05/05/2015   PH Adenomatous Polyps, [facility] (Brother); No repeat due to age  per RTE (dw)  . COLONOSCOPY WITH PROPOFOL N/A 05/05/2015   Procedure: COLONOSCOPY WITH PROPOFOL;  Surgeon: Manya Silvas, MD;  Location: [facility] ENDOSCOPY;  Service: Endoscopy;  Laterality: N/A;  . ESOPHAGOGASTRODUODENOSCOPY  12/24/2006   01/06/2004, 08/27/2001, 06/18/2001  . ESOPHAGOGASTRODUODENOSCOPY (EGD) WITH PROPOFOL N/A 05/05/2015   Procedure: ESOPHAGOGASTRODUODENOSCOPY (EGD) WITH PROPOFOL;  Surgeon: Manya Silvas, MD;  Location: [facility] ENDOSCOPY;  Service: Endoscopy;  Laterality: N/A;  . EYE SURGERY Bilateral    cataracts removed  . KYPHOPLASTY N/A 06/08/2019   Procedure: T12 KYPHOPLASTY;  Surgeon: Hessie Knows, MD;   Location: [facility] ORS;  Service: Orthopedics;  Laterality: N/A;  . KYPHOPLASTY N/A 06/30/2019   Procedure: T 11 KYPHOPLASTY;  Surgeon: Hessie Knows, MD;  Location: [facility] ORS;  Service: Orthopedics;  Laterality: N/A;  . NASAL SEPTUM SURGERY    . NASAL SEPTUM SURGERY    . ovarian cyst removed    . ROTATOR CUFF REPAIR Right   . TONSILLECTOMY    . TONSILLECTOMY    . TOTAL ABDOMINAL HYSTERECTOMY W/ BILATERAL SALPINGOOPHORECTOMY  1980s   for endometriosis in 1980s     reports that she has never smoked. She has never used smokeless tobacco. She reports that she does not drink alcohol or use drugs.  Allergies  Allergen Reactions  . Nsaids Other (See Comments)    Causes gastric burning, pain. History of gastric ulcer.  . Hydrochlorothiazide Other (See Comments)    CAUSED A SEVERE DROP IN POTASSIUM LEVELS REQUIRING HOSPITALIZATION    Family History  Problem Relation Age of Onset  . Breast cancer Sister 37  . Multiple myeloma Sister   . Breast cancer Maternal Aunt 60  . Breast cancer Maternal Aunt 60  . Colon cancer Brother   . Intracerebral hemorrhage Mother   . CVA Mother   . Hypertension Mother   . Stroke Mother   . Basal cell carcinoma Mother   . Melanoma Neg Hx      Prior to Admission medications   Medication Sig Start Date End Date Taking? Authorizing Provider  acetaminophen (TYLENOL) 325 MG tablet Take 650 mg by mouth every 4 (four) hours as needed. For pain / increased temp Max dose for 24 hrs is 3000 mg from all sources of Apap/tyenol   Yes [provider]  ALPRAZolam (XANAX) 0.25 MG tablet Take 0.5 tablets (0.125 mg total) by mouth 4 (four) times daily. 08/24/16  Yes Toni Arthurs, NP  Amino Acids-Protein Hydrolys (FEEDING SUPPLEMENT, PRO-STAT SUGAR FREE 64,) LIQD Take 30 mLs by mouth 2 (two) times daily between meals.   Yes [provider]  bisacodyl (DULCOLAX) 5 MG EC tablet Take 5 mg by mouth daily as needed for moderate constipation.   Yes [provider]  calcium citrate-vitamin D (CITRACAL+D) 315-200 MG-UNIT tablet Take 2 tablets by mouth 2 (two) times daily.    Yes [provider]  Cholecalciferol 1000 units capsule Take 2,000 Units by mouth daily. 2 caps   Yes [provider]  co-enzyme Q-10 30 MG capsule Take 30 mg by mouth daily.   Yes [provider]  diltiazem (CARDIZEM LA) 120 MG 24 hr tablet Take 120 mg by mouth daily.   Yes [provider]  HYDROcodone-acetaminophen (NORCO) 7.5-325 MG tablet Take 1 tablet by mouth every 4 (four) hours as needed. 06/30/19  Yes Hessie Knows, MD  indapamide (LOZOL) 1.25 MG tablet Take 1.25 mg by mouth daily as needed. 06/18/19  Yes [provider]  magnesium oxide (MAG-OX) 400  MG tablet Take 400 mg by mouth 2 (two) times daily.   Yes [provider]  metoCLOPramide (REGLAN) 5 MG tablet Take 5 mg by mouth 3 (three) times daily. 06/01/19 07/08/19 Yes [provider]  Multiple Vitamin (MULTIVITAMIN) tablet Take 1 tablet by mouth daily.   Yes [provider]  omeprazole (PRILOSEC) 40 MG capsule Take 40 mg by mouth 2 (two) times daily.   Yes [provider]  polyethylene glycol (MIRALAX / GLYCOLAX) packet Take 17 g by mouth daily.    Yes [provider]  propranolol (INDERAL) 40 MG tablet Take 40 mg by mouth 3 (three) times daily.   Yes [provider]  sucralfate (CARAFATE) 1 g tablet Take 1 g by mouth 3 (three) times daily. 04/16/19  Yes [provider]  famotidine (PEPCID) 40 MG tablet Take 40 mg by mouth at bedtime. 07/03/19   [provider]    Physical Exam: Vitals:   07/08/19 1130 07/08/19 1200 07/08/19 1328 07/08/19 1329  BP: (!) 149/90 136/82 (!) 140/93   Pulse: (!) 59 (!) 56  85  Resp: 14 11    Temp:      TempSrc:      SpO2: 99% 98%  99%  Weight:      Height:         Vitals:   07/08/19 1130 07/08/19 1200 07/08/19 1328 07/08/19 1329  BP: (!) 149/90 136/82 (!) 140/93     Pulse: (!) 59 (!) 56  85  Resp: 14 11    Temp:      TempSrc:      SpO2: 99% 98%  99%  Weight:      Height:        Constitutional: NAD, alert and oriented x 3 Eyes: PERRL, lids and conjunctivae normal ENMT: Mucous membranes are moist.  Neck: normal, supple, no masses, no thyromegaly Respiratory: Decreased air entry at the bases, no wheezing, no crackles. Normal respiratory effort. No accessory muscle use.  Cardiovascular: Regular rate and rhythm,no murmurs / rubs / gallops. 2+ extremity edema. 2+ pedal pulses. No carotid bruits.  Abdomen: no tenderness, no masses palpated. No hepatosplenomegaly. Bowel sounds positive.  Musculoskeletal: no clubbing / cyanosis. No joint deformity upper and lower extremities.  Skin: no rashes, lesions, ulcers.  Erythema involving left >> right lower extremity Neurologic: No gross focal neurologic deficit. Psychiatric: Normal mood and affect.   Labs on Admission: I have personally reviewed following labs and imaging studies  CBC: Recent Labs  Lab 07/08/19 0917  WBC 8.2  HGB 12.6  HCT 37.1  MCV 89.6  PLT 185   Basic Metabolic Panel: Recent Labs  Lab 07/08/19 0917  NA 129*  K 3.4*  CL 88*  CO2 28  GLUCOSE 76  BUN 20  CREATININE 0.58  CALCIUM 9.1   GFR: Estimated Creatinine Clearance: 41.8 mL/min (by C-G formula based on SCr of 0.58 mg/dL). Liver Function Tests: No results for input(s): AST, ALT, ALKPHOS, BILITOT, PROT, ALBUMIN in the last 168 hours. No results for input(s): LIPASE, AMYLASE in the last 168 hours. No results for input(s): AMMONIA in the last 168 hours. Coagulation Profile: No results for input(s): INR, PROTIME in the last 168 hours. Cardiac Enzymes: No results for input(s): CKTOTAL, CKMB, CKMBINDEX, TROPONINI in the last 168 hours. BNP (last 3 results) No results for input(s): PROBNP in the last 8760 hours. HbA1C: No results for input(s): HGBA1C in the last 72 hours. CBG: No results for input(s): GLUCAP in the  last 168 hours. Lipid Profile: No results for input(s): CHOL, HDL, LDLCALC, TRIG, CHOLHDL, LDLDIRECT in the last 72 hours. Thyroid Function Tests: No results for input(s): TSH, T4TOTAL, FREET4, T3FREE, THYROIDAB in the last 72 hours. Anemia Panel: No results for input(s): VITAMINB12, FOLATE, FERRITIN, TIBC, IRON, RETICCTPCT in the last 72 hours. Urine analysis:    Component Value Date/Time   COLORURINE YELLOW (A) 01/28/2017 1136   APPEARANCEUR HAZY (A) 01/28/2017 1136   LABSPEC 1.010 01/28/2017 1136   PHURINE 8.0 01/28/2017 1136   GLUCOSEU NEGATIVE 01/28/2017 1136   HGBUR NEGATIVE 01/28/2017 1136   BILIRUBINUR NEGATIVE 01/28/2017 1136   KETONESUR NEGATIVE 01/28/2017 1136   PROTEINUR NEGATIVE 01/28/2017 1136   NITRITE NEGATIVE 01/28/2017 1136   LEUKOCYTESUR NEGATIVE 01/28/2017 1136    Radiological Exams on Admission: DG Chest 2 View  Result Date: 07/08/2019 CLINICAL DATA:  Chest pain with palpitations. Shortness of breath. Recent kyphoplasty. EXAM: CHEST - 2 VIEW COMPARISON:  06/01/2019 FINDINGS: Normal heart size. Small bilateral pleural effusions with pulmonary venous congestion noted. Diffuse coarsened interstitial markings are identified bilaterally. On the lateral radiograph there is a 4.1 cm nodular opacity in the expected location of the lingula. On 03/18/2019 abdominal CT a suspicious nodular opacity was identified in this area. Diffuse osteopenia. Multi level lower thoracic and lumbar spine treated compression deformities identified. IMPRESSION: 1. Pulmonary venous congestion and small bilateral pleural effusions. 2. Indeterminate nodular opacity in the expected location of the lingula. A suspicious nodular density measuring 1.5 cm was noted on recent CT of the abdomen and pelvis dated 03/18/2019. Recommend further evaluation with CT of the chest to re-evaluate previously noted pulmonary nodule and exclude underlying malignancy. Electronically Signed   By: Kerby Moors M.D.   On:  07/08/2019 09:55   CT Angio Chest PE W and/or Wo Contrast  Result Date: 07/08/2019 CLINICAL DATA:  Shortness of breath and chest pain post surgery question pulmonary embolism, past history kyphoplasty, RIGHT breast cancer at, mitral valve prolapse, GERD EXAM: CT ANGIOGRAPHY CHEST WITH CONTRAST TECHNIQUE: Multidetector CT imaging of the chest was performed using the standard protocol during bolus administration of intravenous contrast. Multiplanar CT image reconstructions and MIPs were obtained to evaluate the vascular anatomy. CONTRAST:  23m OMNIPAQUE IOHEXOL 350 MG/ML SOLN IV COMPARISON:  03/21/2016 FINDINGS: Cardiovascular: Mild enlargement of cardiac chambers. Atherosclerotic calcification aorta without aneurysm. No pericardial effusion. Pulmonary arteries adequately opacified and patent. No evidence of pulmonary embolism. Mediastinum/Nodes: Large hiatal hernia. 7 mm RIGHT thyroid nodule; Not clinically significant; no follow-up imaging recommended (ref: J Am Coll Radiol. 2015 Feb;12(2): 143-50).Base of cervical region otherwise normal appearance. No thoracic adenopathy. Post RIGHT mastectomy. Lungs/Pleura: BILATERAL small pleural effusions, RIGHT greater than LEFT. Compressive atelectasis of adjacent RIGHT lower lobe. Multiple pulmonary nodules less than 1 cm diameter suspicious for pulmonary metastatic disease. Scarring at base of lingula and at both apices greater on RIGHT. Additional scarring at base of lingula. Few calcified pulmonary granulomata noted. No infiltrate or pneumothorax. Upper Abdomen: Visualized upper abdomen unremarkable Musculoskeletal: Osseous demineralization with prior spinal augmentation procedures of 4 adjacent vertebral compression fractures at the thoracolumbar junction. Review of the MIP images confirms the above findings. IMPRESSION: No evidence of pulmonary embolism. New BILATERAL pulmonary nodules < 1 cm diameter worrisome for pulmonary metastases. Small BILATERAL pleural  effusions and minimal adjacent RIGHT lower lobe atelectasis. Large hiatal hernia. Aortic Atherosclerosis (ICD10-I70.0). Electronically Signed   By: MLavonia DanaM.D.   On: 07/08/2019 11:33   UKoreaVenous Img Lower Bilateral  Result Date: 07/08/2019 CLINICAL DATA:  Bilateral leg swelling and shortness of breath EXAM: BILATERAL LOWER EXTREMITY VENOUS DOPPLER ULTRASOUND TECHNIQUE: Gray-scale sonography with graded compression, as well as color Doppler and duplex ultrasound were performed to evaluate the lower extremity deep venous systems from the level of the common femoral vein and including the common femoral, femoral, profunda femoral, popliteal and calf veins including the posterior tibial, peroneal and gastrocnemius veins when visible. The superficial great saphenous vein was also interrogated. Spectral Doppler was utilized to evaluate flow at rest and with distal augmentation maneuvers in the common femoral, femoral and popliteal veins. COMPARISON:  None. FINDINGS: RIGHT LOWER EXTREMITY Common Femoral Vein: No evidence of thrombus. Normal compressibility, respiratory phasicity and response to augmentation. Saphenofemoral Junction: No evidence of thrombus. Normal compressibility and flow on color Doppler imaging. Profunda Femoral Vein: No evidence of thrombus. Normal compressibility and flow on color Doppler imaging. Femoral Vein: No evidence of thrombus. Normal compressibility, respiratory phasicity and response to augmentation. Popliteal Vein: No evidence of thrombus. Normal compressibility, respiratory phasicity and response to augmentation. Calf Veins: No evidence of thrombus. Normal compressibility and flow on color Doppler imaging. LEFT LOWER EXTREMITY Common Femoral Vein: No evidence of thrombus. Normal compressibility, respiratory phasicity and response to augmentation. Saphenofemoral Junction: No evidence of thrombus. Normal compressibility and flow on color Doppler imaging. Profunda Femoral Vein: No  evidence of thrombus. Normal compressibility and flow on color Doppler imaging. Femoral Vein: No evidence of thrombus. Normal compressibility, respiratory phasicity and response to augmentation. Popliteal Vein: No evidence of thrombus. Normal compressibility, respiratory phasicity and response to augmentation. Calf Veins: No evidence of thrombus. Normal compressibility and flow on color Doppler imaging. IMPRESSION: No evidence of deep venous thrombosis in either lower extremity. Electronically Signed   By: Jerilynn Mages.  Shick M.D.   On: 07/08/2019 11:09    EKG: Independently reviewed.  Sinus rhythm with PVCs and PACs  Assessment/Plan Principal Problem:   Chest pain at rest Active Problems:   Hyponatremia   GERD (gastroesophageal reflux disease)   Hypokalemia    Chest pain Unclear etiology Patient was said to have had EKG changes at her primary care provider's office Her troponin is negative x 2 Will obtain 2D echocardiogram to assess LVEF and rule out regional wall motion abnormality Chest pain may be noncardiac and probably related to reflux since patient has a large hiatal hernia but will rule out cardiac causes first   Hyponatremia Most likely hypovolemic hyponatremia Patient is asymptomatic We will ask patient on Lasix 40 mg IV daily as well as fluid restriction Repeat sodium levels in a.m.  Hypokalemia Secondary to diuretic therapy Will supplement   Acute CHF Unclear etiology Patient with signs of fluid overload as evidenced by bilateral lower extremity swelling bilateral pleural effusions on CT scan of the chest Obtain 2D echocardiogram to assess LVEF Continue Lasix 40 mg IV daily Continue propranolol with holding parameters   GERD Continue oral PPI   DVT prophylaxis: Lovenox Code Status: DNR Family Communication: Greater than 50% of time was spent discussing patient's condition and plan of care with her at the bedside.  All questions and concerns have been addressed.  She  verbalizes understanding and agrees with the plan. Disposition Plan: Back to previous home environment Consults called: Cardiology    Shayley Medlin MD [facility]     07/08/2019, 3:59 PM

## 2019-07-08 NOTE — ED Notes (Signed)
US at bedside

## 2019-07-08 NOTE — ED Notes (Signed)
Pt ambulatory to toilet with 1 staff assistance.

## 2019-07-08 NOTE — ED Provider Notes (Signed)
[facility] Emergency Department Provider Note    First MD Initiated Contact with Patient 07/08/19 0901     (approximate)  I have reviewed the triage vital signs and the nursing notes.   HISTORY  Chief Complaint Chest Pain    HPI Katie Harding is a 79 y.o. female below listed past medical history presents to the ER 1 week status post kyphoplasty presenting with midsternal nonradiating chest pain associate with some shortness of breath and discomfort with taking deep inspiration.  Patient is seen at her primary care physician's office today had EKG done which did show some nonspecific T wave abnormality in V5 was sent to the ER for further evaluation.  States the pain is worsened with movement and palpation.  Denies any fevers.  No nausea or vomiting.  States that she has been taking her diuretic medication but feels like she is still gaining water weight.    Past Medical History:  Diagnosis Date  . #[id number] 2006  . Anxiety   . Arthritis    osteoporosis  . Breast cancer [facility]) 2006   right breast lumpectomy with radiation. no lymph nodes involved  . Colon polyp    adenoma  . Complication of anesthesia   . Constipation 04/2019   on a new bowel regime  . Dyspnea   . Dysrhythmia    ATRIAL FIBRILLATION  . Fracture of vertebra due to osteoporosis (HCC)    history of L1, L2 and now T12  . GERD (gastroesophageal reflux disease) 04/04/2015  . Heart disease   . History of gastric ulcer 04/2001  . History of right breast cancer    post surgery/ XRT  . MVP (mitral valve prolapse)    dental prophylaxis  . Osteoporosis   . Osteoporosis, post-menopausal   . Personal history of radiation therapy 2006   right breast ca  . PONV (postoperative nausea and vomiting)    NAUSEA ON SEVERAL OCCASIONS  . Scoliosis   . Surgical menopause    Family History  Problem Relation Age of Onset  . Breast cancer Sister 76  . Multiple myeloma Sister   . Breast cancer  Maternal Aunt 60  . Breast cancer Maternal Aunt 60  . Colon cancer Brother   . Intracerebral hemorrhage Mother   . CVA Mother   . Hypertension Mother   . Stroke Mother   . Basal cell carcinoma Mother   . Melanoma Neg Hx    Past Surgical History:  Procedure Laterality Date  . BREAST BIOPSY Right 2006   breast ca  . BREAST LUMPECTOMY Right 2006   breast ca  . CATARACT EXTRACTION    . CHOLECYSTECTOMY    . 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 COLONOSCOPY  11/29/2004   PH Adenomatous Polyps, [facility] (Brother)  . COLONOSCOPY  03/14/2010   PH Adenomatous Polyps, [facility] (Brother); CBF 03/2015, Ltr mailed 01/19/2015 (dw)  . COLONOSCOPY  05/05/2015   PH Adenomatous Polyps, [facility] (Brother); No repeat due to age per RTE (dw)  . COLONOSCOPY WITH PROPOFOL N/A 05/05/2015   Procedure: COLONOSCOPY WITH PROPOFOL;  Surgeon: Manya Silvas, MD;  Location: [facility] ENDOSCOPY;  Service: Endoscopy;  Laterality: N/A;  . ESOPHAGOGASTRODUODENOSCOPY  12/24/2006   01/06/2004, 08/27/2001, 06/18/2001  . ESOPHAGOGASTRODUODENOSCOPY (EGD) WITH PROPOFOL N/A 05/05/2015   Procedure: ESOPHAGOGASTRODUODENOSCOPY (EGD) WITH PROPOFOL;  Surgeon: Manya Silvas, MD;  Location: [facility] ENDOSCOPY;  Service: Endoscopy;  Laterality: N/A;  . EYE SURGERY Bilateral  cataracts removed  . KYPHOPLASTY N/A 06/08/2019   Procedure: T12 KYPHOPLASTY;  Surgeon: Hessie Knows, MD;  Location: [facility] ORS;  Service: Orthopedics;  Laterality: N/A;  . KYPHOPLASTY N/A 06/30/2019   Procedure: T 11 KYPHOPLASTY;  Surgeon: Hessie Knows, MD;  Location: [facility] ORS;  Service: Orthopedics;  Laterality: N/A;  . NASAL SEPTUM SURGERY    . NASAL SEPTUM SURGERY    . ovarian cyst removed    . ROTATOR CUFF REPAIR Right   . TONSILLECTOMY    . TONSILLECTOMY    . TOTAL ABDOMINAL HYSTERECTOMY W/ BILATERAL SALPINGOOPHORECTOMY  1980s   for endometriosis in 1980s   Patient Active Problem List   Diagnosis Date Noted  . Protein-calorie  malnutrition, severe 01/30/2017  . Hyponatremia 01/28/2017  . Neuropathy 09/19/2016  . Urinary retention 09/19/2016  . Closed fracture of multiple pubic rami ([place]) 08/20/2016      Prior to Admission medications   Medication Sig Start Date End Date Taking? Authorizing Provider  ALPRAZolam (XANAX) 0.25 MG tablet Take 0.5 tablets (0.125 mg total) by mouth 4 (four) times daily. 08/24/16  Yes Toni Arthurs, NP  Amino Acids-Protein Hydrolys (FEEDING SUPPLEMENT, PRO-STAT SUGAR FREE 64,) LIQD Take 30 mLs by mouth 2 (two) times daily between meals.   Yes [provider]  calcium citrate-vitamin D (CITRACAL+D) 315-200 MG-UNIT tablet Take 2 tablets by mouth 2 (two) times daily.    Yes [provider]  Cholecalciferol 1000 units capsule Take 2,000 Units by mouth daily. 2 caps   Yes [provider]  co-enzyme Q-10 30 MG capsule Take 30 mg by mouth daily.   Yes [provider]  diltiazem (CARDIZEM LA) 120 MG 24 hr tablet Take 120 mg by mouth daily.   Yes [provider]  HYDROcodone-acetaminophen (NORCO) 7.5-325 MG tablet Take 1 tablet by mouth every 4 (four) hours as needed. 06/30/19  Yes Hessie Knows, MD  indapamide (LOZOL) 1.25 MG tablet Take 1.25 mg by mouth daily as needed. 06/18/19  Yes [provider]  magnesium oxide (MAG-OX) 400 MG tablet Take 400 mg by mouth 2 (two) times daily.   Yes [provider]  metoCLOPramide (REGLAN) 5 MG tablet Take 5 mg by mouth 3 (three) times daily. 06/01/19 07/08/19 Yes [provider]  Multiple Vitamin (MULTIVITAMIN) tablet Take 1 tablet by mouth daily.   Yes [provider]  omeprazole (PRILOSEC) 40 MG capsule Take 40 mg by mouth 2 (two) times daily.   Yes [provider]  sucralfate (CARAFATE) 1 g tablet Take 1 g by mouth 3 (three) times daily. 04/16/19  Yes [provider]  acetaminophen (TYLENOL) 325 MG tablet Take 650 mg by mouth every 4 (four) hours as needed. For pain  / increased temp Max dose for 24 hrs is 3000 mg from all sources of Apap/tyenol    [provider]  docusate sodium (COLACE) 100 MG capsule Take 100 mg by mouth 2 (two) times daily.    [provider]  famotidine (PEPCID) 40 MG tablet Take 40 mg by mouth at bedtime. 07/03/19   [provider]  polyethylene glycol (MIRALAX / GLYCOLAX) packet Take 17 g by mouth daily.     [provider]  propranolol (INDERAL) 40 MG tablet Take 40 mg by mouth 3 (three) times daily.    [provider]  senna (SENOKOT) 8.6 MG tablet Take 2 tablets by mouth 2 (two) times daily.     [provider]    Allergies Nsaids and Hydrochlorothiazide  Social History Social History   Tobacco Use  . Smoking status: Never Smoker  . Smokeless tobacco: Never Used  Substance Use Topics  . Alcohol use: No  . Drug use: No    Review of Systems Patient denies headaches, rhinorrhea, blurry vision, numbness, shortness of breath, chest pain, edema, cough, abdominal pain, nausea, vomiting, diarrhea, dysuria, fevers, rashes or hallucinations unless otherwise stated above in HPI. ____________________________________________   PHYSICAL EXAM:  VITAL SIGNS: Vitals:   07/08/19 1030 07/08/19 1100  BP: 138/78 140/87  Pulse: (!) 58 66  Resp: 15 15  Temp:    SpO2: 100% 100%    Constitutional: Alert and oriented.  Eyes: Conjunctivae are normal.  Head: Atraumatic. Nose: No congestion/rhinnorhea. Mouth/Throat: Mucous membranes are moist.   Neck: No stridor. Painless ROM.  Cardiovascular: Normal rate, regular rhythm. Grossly normal heart sounds.  Good peripheral circulation. Respiratory: Normal respiratory effort.  No retractions. Lungs CTAB expiratory crackles anteriorly. Gastrointestinal: Soft and nontender. No distention. No abdominal bruits. No CVA tenderness. Genitourinary:  Musculoskeletal: No lower extremity tenderness 2+ BLE edema.  No joint  effusions. Neurologic:  Normal speech and language. No gross focal neurologic deficits are appreciated. No facial droop Skin:  Skin is warm, dry and intact. No rash noted. Psychiatric: Mood and affect are normal. Speech and behavior are normal.  ____________________________________________   LABS (all labs ordered are listed, but only abnormal results are displayed)  Results for orders placed or performed during the hospital encounter of 07/08/19 (from the past 24 hour(s))  Basic metabolic panel     Status: Abnormal   Collection Time: 07/08/19  9:17 AM  Result Value Ref Range   Sodium 129 (L) 135 - 145 mmol/L   Potassium 3.4 (L) 3.5 - 5.1 mmol/L   Chloride 88 (L) 98 - 111 mmol/L   CO2 28 22 - 32 mmol/L   Glucose, Bld 76 70 - 99 mg/dL   BUN 20 8 - 23 mg/dL   Creatinine, Ser 0.58 0.44 - 1.00 mg/dL   Calcium 9.1 8.9 - 10.3 mg/dL   GFR calc non Af Amer >60 >60 mL/min   GFR calc Af Amer >60 >60 mL/min   Anion gap 13 5 - 15  CBC     Status: None   Collection Time: 07/08/19  9:17 AM  Result Value Ref Range   WBC 8.2 4.0 - 10.5 K/uL   RBC 4.14 3.87 - 5.11 MIL/uL   Hemoglobin 12.6 12.0 - 15.0 g/dL   HCT 37.1 36.0 - 46.0 %   MCV 89.6 80.0 - 100.0 fL   MCH 30.4 26.0 - 34.0 pg   MCHC 34.0 30.0 - 36.0 g/dL   RDW 14.4 11.5 - 15.5 %   Platelets 313 150 - 400 K/uL   nRBC 0.0 0.0 - 0.2 %  Troponin I (High Sensitivity)     Status: None   Collection Time: 07/08/19  9:17 AM  Result Value Ref Range   Troponin I (High Sensitivity) 4 <18 ng/L  Troponin I (High Sensitivity)     Status: None   Collection Time: 07/08/19 11:01 AM  Result Value Ref Range   Troponin I (High Sensitivity) 4 <18 ng/L   ____________________________________________  EKG My review and personal interpretation at Time: 8:35   Indication: chestpain  Rate: 70  Rhythm: sinus Axis: normal Other: biphasic t wave abnormality in V5, no stemi or depressions   My review and personal interpretation at Time: 8:55    Indication: chest pain  Rate:  65  Rhythm: sinus Axis: normal Other: normal intervals, no stemi, no t wave abn  ____________________________________________  RADIOLOGY  I personally reviewed all radiographic images ordered to evaluate for the above acute complaints and reviewed radiology reports and findings.  These findings were personally discussed with the patient.  Please see medical record for radiology report.  ____________________________________________   PROCEDURES  Procedure(s) performed:  Procedures    Critical Care performed: no ____________________________________________   INITIAL IMPRESSION / ASSESSMENT AND PLAN / ED COURSE  Pertinent labs & imaging results that were available during my care of the patient were reviewed by me and considered in my medical decision making (see chart for details).   DDX: ACS, pericarditis, esophagitis, boerhaaves, pe, dissection, pna, bronchitis, costochondritis   Katie Harding is a 79 y.o. who presents to the ED with symptoms as described above.  Patient nontoxic-appearing but with chronic comorbidities high risk profile.  As she is recently postop will order CTA to evaluate for evidence of PE.  Does not seem consistent with dissection.  I do suspect she might be having some component of worsening CHF given worsening swelling despite Lasix.  The patient will be placed on continuous pulse oximetry and telemetry for monitoring.  Laboratory evaluation will be sent to evaluate for the above complaints.     Clinical Course as of Jul 07 1144  Wed Jul 08, 2019  1136 GFR, Est Non African American: >60 [PR]  1138 CT imaging is reassuring.  Initial troponin stable but given EKG changes age and risk factors will discuss with hospitalist for chest pain obs.  I suspect she will need echo and I do suspect a component of worsening congestive heart failure based on her clinical presentation.  [PR]    Clinical Course User Index [PR] Merlyn Lot, MD    The patient was evaluated in Emergency Department today for the symptoms described in the history of present illness. He/she was evaluated in the context of the global COVID-19 pandemic, which necessitated consideration that the patient might be at risk for infection with the SARS-CoV-2 virus that causes COVID-19. Institutional protocols and algorithms that pertain to the evaluation of patients at risk for COVID-19 are in a state of rapid change based on information released by regulatory bodies including the CDC and federal and state organizations. These policies and algorithms were followed during the patient's care in the ED.  As part of my medical decision making, I reviewed the following data within the [place] notes reviewed and incorporated, Labs reviewed, notes from prior ED visits and [place] Controlled Substance Database   ____________________________________________   FINAL CLINICAL IMPRESSION(S) / ED DIAGNOSES  Final diagnoses:  Nonspecific chest pain      NEW MEDICATIONS STARTED DURING THIS VISIT:  New Prescriptions   No medications on file     Note:  This document was prepared using Dragon voice recognition software and may include unintentional dictation errors.    Merlyn Lot, MD 07/08/19 1147

## 2019-07-09 DIAGNOSIS — K219 Gastro-esophageal reflux disease without esophagitis: Secondary | ICD-10-CM

## 2019-07-09 DIAGNOSIS — R079 Chest pain, unspecified: Secondary | ICD-10-CM | POA: Diagnosis not present

## 2019-07-09 DIAGNOSIS — I5031 Acute diastolic (congestive) heart failure: Secondary | ICD-10-CM

## 2019-07-09 DIAGNOSIS — E871 Hypo-osmolality and hyponatremia: Secondary | ICD-10-CM | POA: Diagnosis not present

## 2019-07-09 DIAGNOSIS — E876 Hypokalemia: Secondary | ICD-10-CM | POA: Diagnosis not present

## 2019-07-09 LAB — BASIC METABOLIC PANEL
Anion gap: 9 (ref 5–15)
BUN: 19 mg/dL (ref 8–23)
CO2: 32 mmol/L (ref 22–32)
Calcium: 8.6 mg/dL — ABNORMAL LOW (ref 8.9–10.3)
Chloride: 91 mmol/L — ABNORMAL LOW (ref 98–111)
Creatinine, Ser: 0.6 mg/dL (ref 0.44–1.00)
GFR calc Af Amer: >60 mL/min (ref >60)
GFR calc non Af Amer: >60 mL/min (ref >60)
Glucose, Bld: 93 mg/dL (ref 70–99)
Potassium: 3.5 mmol/L (ref 3.5–5.1)
Sodium: 132 mmol/L — ABNORMAL LOW (ref 135–145)

## 2019-07-09 LAB — CBC
HCT: 34.9 % — ABNORMAL LOW (ref 36.0–46.0)
Hemoglobin: 12 g/dL (ref 12.0–15.0)
MCH: 30.4 pg (ref 26.0–34.0)
MCHC: 34.4 g/dL (ref 30.0–36.0)
MCV: 88.4 fL (ref 80.0–100.0)
Platelets: 304 10*3/uL (ref 150–400)
RBC: 3.95 MIL/uL (ref 3.87–5.11)
RDW: 14.4 % (ref 11.5–15.5)
WBC: 7.6 10*3/uL (ref 4.0–10.5)
nRBC: 0 % (ref 0.0–0.2)

## 2019-07-09 MED ORDER — FUROSEMIDE 10 MG/ML IJ SOLN
40.0000 mg | Freq: Two times a day (BID) | INTRAMUSCULAR | Status: DC
  Administered 2019-07-09 – 2019-07-10 (×2): 40 mg via INTRAVENOUS
  Filled 2019-07-09 (×2): qty 4

## 2019-07-09 MED ORDER — ENSURE ENLIVE PO LIQD: 237.0000 mL | Freq: Two times a day (BID) | ORAL | Status: DC

## 2019-07-09 NOTE — Progress Notes (Addendum)
[facility] NAME: Katie Harding    MR#:  [id number]  DATE OF BIRTH:  06-13-1940  SUBJECTIVE:   Patient came in with increasing leg swelling for last several days along with some shortness of breath and chest tightness. She was found to be in congestive heart failure. Started on IV Lasix. Seems to be urinating well and weight trending down her worse legs still quite edematous REVIEW OF SYSTEMS:   Review of Systems  Constitutional: Negative for chills, fever and weight loss.  HENT: Negative for ear discharge, ear pain and nosebleeds.   Eyes: Negative for blurred vision, pain and discharge.  Respiratory: Negative for sputum production, shortness of breath, wheezing and stridor.   Cardiovascular: Negative for chest pain, palpitations, orthopnea and PND.  Gastrointestinal: Negative for abdominal pain, diarrhea, nausea and vomiting.  Genitourinary: Negative for frequency and urgency.  Musculoskeletal: Negative for back pain and joint pain.  Neurological: Negative for sensory change, speech change, focal weakness and weakness.  Psychiatric/Behavioral: Negative for depression and hallucinations. The patient is not nervous/anxious.    Tolerating Diet:yes  Tolerating PT: ambulatory  DRUG ALLERGIES:   Allergies  Allergen Reactions  . Nsaids Other (See Comments)    Causes gastric burning, pain. History of gastric ulcer.  . Hydrochlorothiazide Other (See Comments)    CAUSED A SEVERE DROP IN POTASSIUM LEVELS REQUIRING HOSPITALIZATION    VITALS:  Blood pressure 114/82, pulse 67, temperature 97.6 F (36.4 C), resp. rate 18, height 5\' 7"  (1.702 m), weight 46.3 kg, SpO2 98 %.  PHYSICAL EXAMINATION:   Physical Exam  GENERAL:  79 y.o.-year-old patient lying in the bed with no acute distress.  EYES: Pupils equal, round, reactive to light and accommodation. No scleral icterus.   HEENT: Head atraumatic, normocephalic. Oropharynx and  nasopharynx clear.  NECK:  Supple, no jugular venous distention. No thyroid enlargement, no tenderness.  LUNGS: Normal breath sounds bilaterally, no wheezing, rales, rhonchi. No use of accessory muscles of respiration.  CARDIOVASCULAR: S1, S2 normal. No murmurs, rubs, or gallops.  ABDOMEN: Soft, nontender, nondistended. Bowel sounds present. No organomegaly or mass.  EXTREMITIES: No cyanosis, clubbing  +++ edema b/l.    NEUROLOGIC: Cranial nerves II through XII are intact. No focal Motor or sensory deficits b/l.   PSYCHIATRIC:  patient is alert and oriented x 3.  SKIN: No obvious rash, lesion, or ulcer.   LABORATORY PANEL:  CBC Recent Labs  Lab 07/09/19 0418  WBC 7.6  HGB 12.0  HCT 34.9*  PLT 304    Chemistries  Recent Labs  Lab 07/09/19 0418  NA 132*  K 3.5  CL 91*  CO2 32  GLUCOSE 93  BUN 19  CREATININE 0.60  CALCIUM 8.6*   Cardiac Enzymes No results for input(s): TROPONINI in the last 168 hours. RADIOLOGY:  DG Chest 2 View  Result Date: 07/08/2019 CLINICAL DATA:  Chest pain with palpitations. Shortness of breath. Recent kyphoplasty. EXAM: CHEST - 2 VIEW COMPARISON:  06/01/2019 FINDINGS: Normal heart size. Small bilateral pleural effusions with pulmonary venous congestion noted. Diffuse coarsened interstitial markings are identified bilaterally. On the lateral radiograph there is a 4.1 cm nodular opacity in the expected location of the lingula. On 03/18/2019 abdominal CT a suspicious nodular opacity was identified in this area. Diffuse osteopenia. Multi level lower thoracic and lumbar spine treated compression deformities identified. IMPRESSION: 1. Pulmonary venous congestion and small bilateral pleural effusions. 2. Indeterminate nodular opacity in the expected location of  the lingula. A suspicious nodular density measuring 1.5 cm was noted on recent CT of the abdomen and pelvis dated 03/18/2019. Recommend further evaluation with CT of the chest to re-evaluate previously  noted pulmonary nodule and exclude underlying malignancy. Electronically Signed   By: Kerby Moors M.D.   On: 07/08/2019 09:55   CT Angio Chest PE W and/or Wo Contrast  Result Date: 07/08/2019 CLINICAL DATA:  Shortness of breath and chest pain post surgery question pulmonary embolism, past history kyphoplasty, RIGHT breast cancer at, mitral valve prolapse, GERD EXAM: CT ANGIOGRAPHY CHEST WITH CONTRAST TECHNIQUE: Multidetector CT imaging of the chest was performed using the standard protocol during bolus administration of intravenous contrast. Multiplanar CT image reconstructions and MIPs were obtained to evaluate the vascular anatomy. CONTRAST:  44mL OMNIPAQUE IOHEXOL 350 MG/ML SOLN IV COMPARISON:  03/21/2016 FINDINGS: Cardiovascular: Mild enlargement of cardiac chambers. Atherosclerotic calcification aorta without aneurysm. No pericardial effusion. Pulmonary arteries adequately opacified and patent. No evidence of pulmonary embolism. Mediastinum/Nodes: Large hiatal hernia. 7 mm RIGHT thyroid nodule; Not clinically significant; no follow-up imaging recommended (ref: J Am Coll Radiol. 2015 Feb;12(2): 143-50).Base of cervical region otherwise normal appearance. No thoracic adenopathy. Post RIGHT mastectomy. Lungs/Pleura: BILATERAL small pleural effusions, RIGHT greater than LEFT. Compressive atelectasis of adjacent RIGHT lower lobe. Multiple pulmonary nodules less than 1 cm diameter suspicious for pulmonary metastatic disease. Scarring at base of lingula and at both apices greater on RIGHT. Additional scarring at base of lingula. Few calcified pulmonary granulomata noted. No infiltrate or pneumothorax. Upper Abdomen: Visualized upper abdomen unremarkable Musculoskeletal: Osseous demineralization with prior spinal augmentation procedures of 4 adjacent vertebral compression fractures at the thoracolumbar junction. Review of the MIP images confirms the above findings. IMPRESSION: No evidence of pulmonary embolism.  New BILATERAL pulmonary nodules < 1 cm diameter worrisome for pulmonary metastases. Small BILATERAL pleural effusions and minimal adjacent RIGHT lower lobe atelectasis. Large hiatal hernia. Aortic Atherosclerosis (ICD10-I70.0). Electronically Signed   By: Lavonia Dana M.D.   On: 07/08/2019 11:33   US Venous Img Lower Bilateral  Result Date: 07/08/2019 CLINICAL DATA:  Bilateral leg swelling and shortness of breath EXAM: BILATERAL LOWER EXTREMITY VENOUS DOPPLER ULTRASOUND TECHNIQUE: Gray-scale sonography with graded compression, as well as color Doppler and duplex ultrasound were performed to evaluate the lower extremity deep venous systems from the level of the common femoral vein and including the common femoral, femoral, profunda femoral, popliteal and calf veins including the posterior tibial, peroneal and gastrocnemius veins when visible. The superficial great saphenous vein was also interrogated. Spectral Doppler was utilized to evaluate flow at rest and with distal augmentation maneuvers in the common femoral, femoral and popliteal veins. COMPARISON:  None. FINDINGS: RIGHT LOWER EXTREMITY Common Femoral Vein: No evidence of thrombus. Normal compressibility, respiratory phasicity and response to augmentation. Saphenofemoral Junction: No evidence of thrombus. Normal compressibility and flow on color Doppler imaging. Profunda Femoral Vein: No evidence of thrombus. Normal compressibility and flow on color Doppler imaging. Femoral Vein: No evidence of thrombus. Normal compressibility, respiratory phasicity and response to augmentation. Popliteal Vein: No evidence of thrombus. Normal compressibility, respiratory phasicity and response to augmentation. Calf Veins: No evidence of thrombus. Normal compressibility and flow on color Doppler imaging. LEFT LOWER EXTREMITY Common Femoral Vein: No evidence of thrombus. Normal compressibility, respiratory phasicity and response to augmentation. Saphenofemoral Junction: No  evidence of thrombus. Normal compressibility and flow on color Doppler imaging. Profunda Femoral Vein: No evidence of thrombus. Normal compressibility and flow on color Doppler imaging. Femoral Vein: No  evidence of thrombus. Normal compressibility, respiratory phasicity and response to augmentation. Popliteal Vein: No evidence of thrombus. Normal compressibility, respiratory phasicity and response to augmentation. Calf Veins: No evidence of thrombus. Normal compressibility and flow on color Doppler imaging. IMPRESSION: No evidence of deep venous thrombosis in either lower extremity. Electronically Signed   By: Jerilynn Mages.  Shick M.D.   On: 07/08/2019 11:09   ECHOCARDIOGRAM COMPLETE  Result Date: 07/08/2019    ECHOCARDIOGRAM REPORT   Patient Name:   Katie Harding Date of Exam: 07/08/2019 Medical Rec #:  [id number]       Height:       66.0 in Accession #:    [id number]      Weight:       104.0 lb Date of Birth:  July 01, 1940       BSA:          1.515 m Patient Age:    72 years        BP:           105/61 mmHg Patient Gender: F               HR:           62 bpm. Exam Location:  [facility] Procedure: 2D Echo, Cardiac Doppler and Color Doppler Indications:     I50.9 Congestive heart failure  History:         Patient has no prior history of Echocardiogram examinations.                  Dyspnea. Mitralo Valve Prolapse. Dysrhythmia.  Sonographer:     Wilford Sports Rodgers-Jones Referring Phys:  [place] Diagnosing Phys: Serafina Royals MD IMPRESSIONS  1. Left ventricular ejection fraction, by estimation, is 60 to 65%. The left ventricle has normal function. The left ventricle has no regional wall motion abnormalities. Left ventricular diastolic parameters were normal.  2. Right ventricular systolic function is normal. The right ventricular size is mildly enlarged. There is mildly elevated pulmonary artery systolic pressure.  3. Right atrial size was moderately dilated.  4. The mitral valve is normal in structure. Mild mitral  valve regurgitation.  5. Tricuspid valve regurgitation is moderate to severe.  6. The aortic valve is normal in structure. Aortic valve regurgitation is trivial. FINDINGS  Left Ventricle: Left ventricular ejection fraction, by estimation, is 60 to 65%. The left ventricle has normal function. The left ventricle has no regional wall motion abnormalities. The left ventricular internal cavity size was normal in size. There is  no left ventricular hypertrophy. Left ventricular diastolic parameters were normal. Right Ventricle: The right ventricular size is mildly enlarged. No increase in right ventricular wall thickness. Right ventricular systolic function is normal. There is mildly elevated pulmonary artery systolic pressure. The tricuspid regurgitant velocity is 2.43 m/s, and with an assumed right atrial pressure of 10 mmHg, the estimated right ventricular systolic pressure is 76.2 mmHg. Left Atrium: Left atrial size was normal in size. Right Atrium: Right atrial size was moderately dilated. Pericardium: There is no evidence of pericardial effusion. Mitral Valve: The mitral valve is normal in structure. Mild mitral valve regurgitation. Tricuspid Valve: The tricuspid valve is normal in structure. Tricuspid valve regurgitation is moderate to severe. Aortic Valve: The aortic valve is normal in structure. Aortic valve regurgitation is trivial. Pulmonic Valve: The pulmonic valve was normal in structure. Pulmonic valve regurgitation is mild. Aorta: The aortic root and ascending aorta are structurally normal, with no evidence of dilitation. IAS/Shunts: No atrial  level shunt detected by color flow Doppler.  LEFT VENTRICLE PLAX 2D LVIDd:         3.74 cm  Diastology LVIDs:         2.23 cm  LV e' lateral:   9.36 cm/s LV PW:         0.78 cm  LV E/e' lateral: 7.4 LV IVS:        1.00 cm  LV e' medial:    6.64 cm/s LVOT diam:     1.70 cm  LV E/e' medial:  10.4 LV SV:         43 LV SV Index:   29 LVOT Area:     2.27 cm  RIGHT  VENTRICLE             IVC RV Basal diam:  3.62 cm     IVC diam: 1.68 cm RV S prime:     17.50 cm/s TAPSE (M-mode): 2.3 cm LEFT ATRIUM             Index       RIGHT ATRIUM           Index LA diam:        3.70 cm 2.44 cm/m  RA Area:     20.70 cm LA Vol (A2C):   41.1 ml 27.14 ml/m RA Volume:   68.30 ml  45.10 ml/m LA Vol (A4C):   37.0 ml 24.43 ml/m LA Biplane Vol: 40.3 ml 26.61 ml/m  AORTIC VALVE LVOT Vmax:   97.90 cm/s LVOT Vmean:  63.800 cm/s LVOT VTI:    0.191 m  AORTA Ao Root diam: 3.40 cm Ao Asc diam:  3.20 cm MITRAL VALVE               TRICUSPID VALVE MV Area (PHT): 3.48 cm    TR Peak grad:   23.6 mmHg MV Decel Time: 218 msec    TR Vmax:        243.00 cm/s MV E velocity: 69.00 cm/s MV A velocity: 48.80 cm/s  SHUNTS MV E/A ratio:  1.41        Systemic VTI:  0.19 m                            Systemic Diam: 1.70 cm Serafina Royals MD Electronically signed by Serafina Royals MD Signature Date/Time: 07/08/2019/8:53:00 PM    Final    ASSESSMENT AND PLAN:   Katie Harding is a 79 y.o. female with medical history significant for recent kyphoplasty (about a week postop), history of chronic diastolic dysfunction CHF who was sent to the emergency room by her primary care provider for evaluation of midsternal nonradiating chest pain which she has had for about 5 days.  She rates her pain a 5 x 10 in intensity at its worst associated with shortness of breath and discomfort with deep inspiration.  Acute on chronic diastolic congestive heart failure with pulmonary edema and  pleural effusion with bilateral lower extremity edema -continue IV Lasix 40 mg BID-- change to oral Lasix from tomorrow after discussing dose for Lasix with cardiology Dr. Nehemiah Massed -patient was seen by Dr. Nehemiah Massed appreciate input -2 g sodium discussed -sodium improving from 129 to 132 -echo shows EF 55-60%, mod-severe TR -weight trending down - Reds Clip reading --30% (stable)  Hyponatremia improving with diuresis  Jerrye Bushy continue  PPI  Chest pain  -suspected due to pulmonary edema and CHF. Troponins flat. EKG no  acute changes.  Osteoporosis with vertebral compression fracture  -status post recent Keifer plasty by Dr. Rudene Christians  Procedures:none Family communication :none Consults :[facility] cardiology CODE STATUS: DNR prior to admission DVT Prophylaxis :lovenox   Dispo: The patient is from: Home              Anticipated d/c is to: Home              Anticipated d/c date is tomorrow if remains stable              Patient currently is not medically stable to d/c. patient has significant leg edema. Knee needs couple more doses of IV Lasix for diuresis.       TOTAL TIME TAKING CARE OF THIS PATIENT: *35* minutes.  >50% time spent on counselling and coordination of care  Note: This dictation was prepared with Dragon dictation along with smaller phrase technology. Any transcriptional errors that result from this process are unintentional.  Fritzi Mandes M.D    [facility]   CC: Primary care physician; Rusty Aus, MDPatient ID: Katie Harding, female   DOB: 1940-06-05, 79 y.o.   MRN: [id number]

## 2019-07-09 NOTE — Plan of Care (Signed)

## 2019-07-10 DIAGNOSIS — R079 Chest pain, unspecified: Secondary | ICD-10-CM | POA: Diagnosis not present

## 2019-07-10 MED ORDER — ATORVASTATIN CALCIUM 40 MG PO TABS: 40.0000 mg | ORAL_TABLET | Freq: Every day | ORAL | 0 refills | Status: AC

## 2019-07-10 MED ORDER — ASPIRIN 81 MG PO TBEC: 81.0000 mg | DELAYED_RELEASE_TABLET | Freq: Every day | ORAL | 11 refills | Status: AC

## 2019-07-10 MED ORDER — POTASSIUM CHLORIDE CRYS ER 20 MEQ PO TBCR: 20.0000 meq | EXTENDED_RELEASE_TABLET | Freq: Every day | ORAL | 1 refills | Status: DC

## 2019-07-10 MED ORDER — FUROSEMIDE 40 MG PO TABS
40.0000 mg | ORAL_TABLET | Freq: Every day | ORAL | 11 refills | Status: DC
Start: 2019-07-10 — End: 2019-09-19

## 2019-07-10 NOTE — Discharge Summary (Signed)
Physician Discharge Summary  Katie Harding MGQ:[id number] DOB: [birth year] DOA: 07/08/2019  PCP: Rusty Aus, MD  Admit date: 07/08/2019 Discharge date: 07/10/2019  Admitted From: Home Disposition:Home    Recommendations for Outpatient Follow-up:  1. Follow up with PCP in 1-2 weeks 2. Follow-up with cardiology 3. Please obtain BMP/CBC in one week 4. Please follow up on the following pending results: None  Home Health: No Equipment/Devices:  walker Discharge Condition: Stable CODE STATUS: Order diet recommendation: Heart Healthy   Brief/Interim Summary: Katie Plair Legetteis a 79 y.o.femalewith medical history significant forrecent kyphoplasty(about a week postop),history of chronic diastolic dysfunction CHF who was sent to the emergency room by her primary care provider for evaluation of midsternal nonradiating chest pain which she has had for about 5 days. She rates her pain a 5 x 10 in intensity at its worst associated with shortness of breath and discomfort with deep inspiration.  She was also having worsening lower extremity edema.  Initially diuresed with IV Lasix.  Cardiology was consulted and they are not recommending further evaluation.  Cardizem might be contributing to lower extremity edema.  She was discharged on p.o. Lasix 40 mg daily and will follow up with cardiology as an outpatient.  Echocardiogram with normal EF, moderately severe TR.  Patient's chest pain resolved.  Troponin remained flat.  EKG without any acute change.  She will continue with her home meds and follow-up with her PCP.  Discharge Diagnoses:  Principal Problem:   Nonspecific chest pain Active Problems:   Hyponatremia   GERD (gastroesophageal reflux disease)   Hypokalemia   Acute CHF (congestive heart failure) [facility])  Discharge Instructions  Discharge Instructions    (HEART FAILURE PATIENTS) Call MD:  Anytime you have any of the following symptoms: 1) 3 pound weight gain in 24 hours or 5 pounds  in 1 week 2) shortness of breath, with or without a dry hacking cough 3) swelling in the hands, feet or stomach 4) if you have to sleep on extra pillows at night in order to breathe.   Complete by: As directed    AMB referral to CHF clinic   Complete by: As directed    Diet - low sodium heart healthy   Complete by: As directed    Discharge instructions   Complete by: As directed    It was pleasure taking care of you. We are starting you on a pill called Lasix, take it as directed and follow-up with your cardiologist within 1 to 2 weeks. You can keep your legs elevated and can use compression stockings when on feet which will help with your swelling.   Increase activity slowly   Complete by: As directed    No wound care   Complete by: As directed      Allergies as of 07/10/2019      Reactions   Nsaids Other (See Comments)   Causes gastric burning, pain. History of gastric ulcer.   Hydrochlorothiazide Other (See Comments)   CAUSED A SEVERE DROP IN POTASSIUM LEVELS REQUIRING HOSPITALIZATION      Medication List    STOP taking these medications   famotidine 40 MG tablet Commonly known as: PEPCID     TAKE these medications   acetaminophen 325 MG tablet Commonly known as: TYLENOL Take 650 mg by mouth every 4 (four) hours as needed. For pain / increased temp Max dose for 24 hrs is 3000 mg from all sources of Apap/tyenol   ALPRAZolam 0.25 MG tablet Commonly known  as: XANAX Take 0.5 tablets (0.125 mg total) by mouth 4 (four) times daily.   aspirin 81 MG EC tablet Take 1 tablet (81 mg total) by mouth daily. Swallow whole. Start taking on: July 11, 2019   atorvastatin 40 MG tablet Commonly known as: LIPITOR Take 1 tablet (40 mg total) by mouth daily. Start taking on: July 11, 2019   bisacodyl 5 MG EC tablet Commonly known as: DULCOLAX Take 5 mg by mouth daily as needed for moderate constipation.   calcium citrate-vitamin D 315-200 MG-UNIT tablet Commonly known as:  CITRACAL+D Take 2 tablets by mouth 2 (two) times daily.   Cholecalciferol 25 MCG (1000 UT) capsule Take 2,000 Units by mouth daily. 2 caps   co-enzyme Q-10 30 MG capsule Take 30 mg by mouth daily.   diltiazem 120 MG 24 hr tablet Commonly known as: CARDIZEM LA Take 120 mg by mouth daily.   feeding supplement (PRO-STAT SUGAR FREE 64) Liqd Take 30 mLs by mouth 2 (two) times daily between meals.   furosemide 40 MG tablet Commonly known as: Lasix Take 1 tablet (40 mg total) by mouth daily.   HYDROcodone-acetaminophen 7.5-325 MG tablet Commonly known as: NORCO Take 1 tablet by mouth every 4 (four) hours as needed.   indapamide 1.25 MG tablet Commonly known as: LOZOL Take 1.25 mg by mouth daily as needed.   magnesium oxide 400 MG tablet Commonly known as: MAG-OX Take 400 mg by mouth 2 (two) times daily.   metoCLOPramide 5 MG tablet Commonly known as: REGLAN Take 5 mg by mouth 3 (three) times daily.   multivitamin tablet Take 1 tablet by mouth daily.   omeprazole 40 MG capsule Commonly known as: PRILOSEC Take 40 mg by mouth 2 (two) times daily.   polyethylene glycol 17 g packet Commonly known as: MIRALAX / GLYCOLAX Take 17 g by mouth daily.   potassium chloride SA 20 MEQ tablet Commonly known as: KLOR-CON Take 1 tablet (20 mEq total) by mouth daily. Start taking on: July 11, 2019   propranolol 40 MG tablet Commonly known as: INDERAL Take 40 mg by mouth 3 (three) times daily.   sucralfate 1 g tablet Commonly known as: CARAFATE Take 1 g by mouth 3 (three) times daily.       Follow-up Information    [place] Follow up on 07/16/2019.   Specialty: Cardiology Why: at 11:30am. Enter through the [place] entrance Contact information: [place]       Rusty Aus, MD. Schedule an appointment as soon as possible for a visit in 1 week(s).    Specialty: Internal Medicine Contact information: [place] [address] [phone]        Corey Skains, MD. Schedule an appointment as soon as possible for a visit in 1 week(s).   Specialty: Cardiology Why: for CHF Contact information: [facility] [place] [address] [phone]              Allergies  Allergen Reactions  . Nsaids Other (See Comments)    Causes gastric burning, pain. History of gastric ulcer.  . Hydrochlorothiazide Other (See Comments)    CAUSED A SEVERE DROP IN POTASSIUM LEVELS REQUIRING HOSPITALIZATION    Consultations: Cardiology  Procedures/Studies: DG Chest 2 View  Result Date: 07/08/2019 CLINICAL DATA:  Chest pain with palpitations. Shortness of breath. Recent kyphoplasty. EXAM:  CHEST - 2 VIEW COMPARISON:  06/01/2019 FINDINGS: Normal heart size. Small bilateral pleural effusions with pulmonary venous congestion noted. Diffuse coarsened interstitial markings are identified bilaterally. On the lateral radiograph there is a 4.1 cm nodular opacity in the expected location of the lingula. On 03/18/2019 abdominal CT a suspicious nodular opacity was identified in this area. Diffuse osteopenia. Multi level lower thoracic and lumbar spine treated compression deformities identified. IMPRESSION: 1. Pulmonary venous congestion and small bilateral pleural effusions. 2. Indeterminate nodular opacity in the expected location of the lingula. A suspicious nodular density measuring 1.5 cm was noted on recent CT of the abdomen and pelvis dated 03/18/2019. Recommend further evaluation with CT of the chest to re-evaluate previously noted pulmonary nodule and exclude underlying malignancy. Electronically Signed   By: Kerby Moors M.D.   On: 07/08/2019 09:55   DG Thoracic Spine 2 View  Result Date: 06/30/2019 CLINICAL DATA:  T11 kyphoplasty EXAM: THORACIC SPINE 2  VIEWS; DG C-ARM 1-60 MIN COMPARISON:  MRI 06/23/2019 FINDINGS: Single intraoperative spot image demonstrates multi level vertebroplasty changes, with new vertebroplasty at T11. No visible complicating feature. IMPRESSION: As above. Electronically Signed   By: Rolm Baptise M.D.   On: 06/30/2019 17:34   CT Angio Chest PE W and/or Wo Contrast  Result Date: 07/08/2019 CLINICAL DATA:  Shortness of breath and chest pain post surgery question pulmonary embolism, past history kyphoplasty, RIGHT breast cancer at, mitral valve prolapse, GERD EXAM: CT ANGIOGRAPHY CHEST WITH CONTRAST TECHNIQUE: Multidetector CT imaging of the chest was performed using the standard protocol during bolus administration of intravenous contrast. Multiplanar CT image reconstructions and MIPs were obtained to evaluate the vascular anatomy. CONTRAST:  60mL OMNIPAQUE IOHEXOL 350 MG/ML SOLN IV COMPARISON:  03/21/2016 FINDINGS: Cardiovascular: Mild enlargement of cardiac chambers. Atherosclerotic calcification aorta without aneurysm. No pericardial effusion. Pulmonary arteries adequately opacified and patent. No evidence of pulmonary embolism. Mediastinum/Nodes: Large hiatal hernia. 7 mm RIGHT thyroid nodule; Not clinically significant; no follow-up imaging recommended (ref: J Am Coll Radiol. 2015 Feb;12(2): 143-50).Base of cervical region otherwise normal appearance. No thoracic adenopathy. Post RIGHT mastectomy. Lungs/Pleura: BILATERAL small pleural effusions, RIGHT greater than LEFT. Compressive atelectasis of adjacent RIGHT lower lobe. Multiple pulmonary nodules less than 1 cm diameter suspicious for pulmonary metastatic disease. Scarring at base of lingula and at both apices greater on RIGHT. Additional scarring at base of lingula. Few calcified pulmonary granulomata noted. No infiltrate or pneumothorax. Upper Abdomen: Visualized upper abdomen unremarkable Musculoskeletal: Osseous demineralization with prior spinal augmentation procedures of 4  adjacent vertebral compression fractures at the thoracolumbar junction. Review of the MIP images confirms the above findings. IMPRESSION: No evidence of pulmonary embolism. New BILATERAL pulmonary nodules < 1 cm diameter worrisome for pulmonary metastases. Small BILATERAL pleural effusions and minimal adjacent RIGHT lower lobe atelectasis. Large hiatal hernia. Aortic Atherosclerosis (ICD10-I70.0). Electronically Signed   By: Lavonia Dana M.D.   On: 07/08/2019 11:33   MR THORACIC SPINE WO CONTRAST  Result Date: 06/23/2019 CLINICAL DATA:  Thoracic compression fracture. History of T12 kyphoplasty on 06/08/2019. EXAM: MRI THORACIC SPINE WITHOUT CONTRAST TECHNIQUE: Multiplanar, multisequence MR imaging of the thoracic spine was performed. No intravenous contrast was administered. COMPARISON:  Lumbar spine MRI 06/01/2019. FINDINGS: Alignment: Grade 1 anterolisthesis of C7 on T1 and T1 on T2. Mild left convex curvature of the thoracic spine. Vertebrae: New T11 inferior endplate compression fracture with mild-to-moderate marrow edema and 15% vertebral body height loss without retropulsion. Interval augmentation of the T12 compression fracture with small volume  cement in the ventral epidural space primarily left of midline without associated spinal stenosis. Persistent marrow edema diffusely throughout the T12 vertebral body. Chronic, previously augmented L1, L2, and L3 compression fractures. Subcentimeter stir hyperintense foci in the T5 and T6 vertebral bodies, possibly atypical hemangiomas. Scattered perineural cysts including a 2.5 cm cyst in the left neural foramen at T10-11. Cord:  Normal signal and morphology. Paraspinal and other soft tissues: 6 mm T2 hyperintense right thyroid nodule (not clinically significant; no follow-up imaging recommended). Trace right pleural effusion. Mild paravertebral soft tissue edema at T11-12. Disc levels: The thoracic spinal canal is capacious, and there is mild thoracic spondylosis  without spinal stenosis, spinal cord mass effect, or compressive neural foraminal stenosis. There is mild spinal stenosis at L1 due to vertebral retropulsion as described on the recent lumbar MRI. IMPRESSION: 1. Acute T11 compression fracture with 15% height loss and no retropulsion. 2. Interval augmentation of T12 compression fracture. 3. Chronic L1, L2, and L3 compression fractures. Electronically Signed   By: Logan Bores M.D.   On: 06/23/2019 16:11   US Venous Img Lower Bilateral  Result Date: 07/08/2019 CLINICAL DATA:  Bilateral leg swelling and shortness of breath EXAM: BILATERAL LOWER EXTREMITY VENOUS DOPPLER ULTRASOUND TECHNIQUE: Gray-scale sonography with graded compression, as well as color Doppler and duplex ultrasound were performed to evaluate the lower extremity deep venous systems from the level of the common femoral vein and including the common femoral, femoral, profunda femoral, popliteal and calf veins including the posterior tibial, peroneal and gastrocnemius veins when visible. The superficial great saphenous vein was also interrogated. Spectral Doppler was utilized to evaluate flow at rest and with distal augmentation maneuvers in the common femoral, femoral and popliteal veins. COMPARISON:  None. FINDINGS: RIGHT LOWER EXTREMITY Common Femoral Vein: No evidence of thrombus. Normal compressibility, respiratory phasicity and response to augmentation. Saphenofemoral Junction: No evidence of thrombus. Normal compressibility and flow on color Doppler imaging. Profunda Femoral Vein: No evidence of thrombus. Normal compressibility and flow on color Doppler imaging. Femoral Vein: No evidence of thrombus. Normal compressibility, respiratory phasicity and response to augmentation. Popliteal Vein: No evidence of thrombus. Normal compressibility, respiratory phasicity and response to augmentation. Calf Veins: No evidence of thrombus. Normal compressibility and flow on color Doppler imaging. LEFT LOWER  EXTREMITY Common Femoral Vein: No evidence of thrombus. Normal compressibility, respiratory phasicity and response to augmentation. Saphenofemoral Junction: No evidence of thrombus. Normal compressibility and flow on color Doppler imaging. Profunda Femoral Vein: No evidence of thrombus. Normal compressibility and flow on color Doppler imaging. Femoral Vein: No evidence of thrombus. Normal compressibility, respiratory phasicity and response to augmentation. Popliteal Vein: No evidence of thrombus. Normal compressibility, respiratory phasicity and response to augmentation. Calf Veins: No evidence of thrombus. Normal compressibility and flow on color Doppler imaging. IMPRESSION: No evidence of deep venous thrombosis in either lower extremity. Electronically Signed   By: Jerilynn Mages.  Shick M.D.   On: 07/08/2019 11:09   DG C-Arm 1-60 Min  Result Date: 06/30/2019 CLINICAL DATA:  T11 kyphoplasty EXAM: THORACIC SPINE 2 VIEWS; DG C-ARM 1-60 MIN COMPARISON:  MRI 06/23/2019 FINDINGS: Single intraoperative spot image demonstrates multi level vertebroplasty changes, with new vertebroplasty at T11. No visible complicating feature. IMPRESSION: As above. Electronically Signed   By: Rolm Baptise M.D.   On: 06/30/2019 17:34   ECHOCARDIOGRAM COMPLETE  Result Date: 07/08/2019    ECHOCARDIOGRAM REPORT   Patient Name:   Katie Harding Date of Exam: 07/08/2019 Medical Rec #:  [id number]  Height:       66.0 in Accession #:    [id number]      Weight:       104.0 lb Date of Birth:  November 15, 1940       BSA:          1.515 m Patient Age:    79 years        BP:           105/61 mmHg Patient Gender: F               HR:           62 bpm. Exam Location:  [facility] Procedure: 2D Echo, Cardiac Doppler and Color Doppler Indications:     I50.9 Congestive heart failure  History:         Patient has no prior history of Echocardiogram examinations.                  Dyspnea. Mitralo Valve Prolapse. Dysrhythmia.  Sonographer:     Wilford Sports Rodgers-Jones Referring  Phys:  [place] Diagnosing Phys: Serafina Royals MD IMPRESSIONS  1. Left ventricular ejection fraction, by estimation, is 60 to 65%. The left ventricle has normal function. The left ventricle has no regional wall motion abnormalities. Left ventricular diastolic parameters were normal.  2. Right ventricular systolic function is normal. The right ventricular size is mildly enlarged. There is mildly elevated pulmonary artery systolic pressure.  3. Right atrial size was moderately dilated.  4. The mitral valve is normal in structure. Mild mitral valve regurgitation.  5. Tricuspid valve regurgitation is moderate to severe.  6. The aortic valve is normal in structure. Aortic valve regurgitation is trivial. FINDINGS  Left Ventricle: Left ventricular ejection fraction, by estimation, is 60 to 65%. The left ventricle has normal function. The left ventricle has no regional wall motion abnormalities. The left ventricular internal cavity size was normal in size. There is  no left ventricular hypertrophy. Left ventricular diastolic parameters were normal. Right Ventricle: The right ventricular size is mildly enlarged. No increase in right ventricular wall thickness. Right ventricular systolic function is normal. There is mildly elevated pulmonary artery systolic pressure. The tricuspid regurgitant velocity is 2.43 m/s, and with an assumed right atrial pressure of 10 mmHg, the estimated right ventricular systolic pressure is 63.1 mmHg. Left Atrium: Left atrial size was normal in size. Right Atrium: Right atrial size was moderately dilated. Pericardium: There is no evidence of pericardial effusion. Mitral Valve: The mitral valve is normal in structure. Mild mitral valve regurgitation. Tricuspid Valve: The tricuspid valve is normal in structure. Tricuspid valve regurgitation is moderate to severe. Aortic Valve: The aortic valve is normal in structure. Aortic valve regurgitation is trivial. Pulmonic Valve: The pulmonic  valve was normal in structure. Pulmonic valve regurgitation is mild. Aorta: The aortic root and ascending aorta are structurally normal, with no evidence of dilitation. IAS/Shunts: No atrial level shunt detected by color flow Doppler.  LEFT VENTRICLE PLAX 2D LVIDd:         3.74 cm  Diastology LVIDs:         2.23 cm  LV e' lateral:   9.36 cm/s LV PW:         0.78 cm  LV E/e' lateral: 7.4 LV IVS:        1.00 cm  LV e' medial:    6.64 cm/s LVOT diam:     1.70 cm  LV E/e' medial:  10.4 LV SV:  43 LV SV Index:   29 LVOT Area:     2.27 cm  RIGHT VENTRICLE             IVC RV Basal diam:  3.62 cm     IVC diam: 1.68 cm RV S prime:     17.50 cm/s TAPSE (M-mode): 2.3 cm LEFT ATRIUM             Index       RIGHT ATRIUM           Index LA diam:        3.70 cm 2.44 cm/m  RA Area:     20.70 cm LA Vol (A2C):   41.1 ml 27.14 ml/m RA Volume:   68.30 ml  45.10 ml/m LA Vol (A4C):   37.0 ml 24.43 ml/m LA Biplane Vol: 40.3 ml 26.61 ml/m  AORTIC VALVE LVOT Vmax:   97.90 cm/s LVOT Vmean:  63.800 cm/s LVOT VTI:    0.191 m  AORTA Ao Root diam: 3.40 cm Ao Asc diam:  3.20 cm MITRAL VALVE               TRICUSPID VALVE MV Area (PHT): 3.48 cm    TR Peak grad:   23.6 mmHg MV Decel Time: 218 msec    TR Vmax:        243.00 cm/s MV E velocity: 69.00 cm/s MV A velocity: 48.80 cm/s  SHUNTS MV E/A ratio:  1.41        Systemic VTI:  0.19 m                            Systemic Diam: 1.70 cm Serafina Royals MD Electronically signed by Serafina Royals MD Signature Date/Time: 07/08/2019/8:53:00 PM    Final      Subjective: Patient was feeling better when seen today.  Denies any chest pain or shortness of breath.  She wants to go home as her husband is not feeling well.  Discharge Exam: Vitals:   07/10/19 0722 07/10/19 1126  BP: 120/75 107/65  Pulse: 67 64  Resp: 18 18  Temp: 98.1 F (36.7 C) 98 F (36.7 C)  SpO2: 98% 99%   Vitals:   07/09/19 2125 07/10/19 0444 07/10/19 0722 07/10/19 1126  BP: 107/68 134/83 120/75 107/65  Pulse:  65 60 67 64  Resp:  15 18 18   Temp:  97.7 F (36.5 C) 98.1 F (36.7 C) 98 F (36.7 C)  TempSrc:  Oral Oral Oral  SpO2:  97% 98% 99%  Weight:  45.5 kg    Height:        General: Pt is alert, awake, not in acute distress Cardiovascular: RRR, S1/S2 +, no rubs, no gallops Respiratory: CTA bilaterally, no wheezing, no rhonchi Abdominal: Soft, NT, ND, bowel sounds + Extremities: 1+ LE edema, no cyanosis   The results of significant diagnostics from this hospitalization (including imaging, microbiology, ancillary and laboratory) are listed below for reference.    Microbiology: Recent Results (from the past 240 hour(s))  SARS Coronavirus 2 by RT PCR (hospital order, performed in [facility] hospital lab) Nasopharyngeal Nasopharyngeal Swab     Status: None   Collection Time: 06/30/19 11:46 AM   Specimen: Nasopharyngeal Swab  Result Value Ref Range Status   SARS Coronavirus 2 NEGATIVE NEGATIVE Final    Comment: (NOTE) SARS-CoV-2 target nucleic acids are NOT DETECTED. The SARS-CoV-2 RNA is generally detectable in upper and lower respiratory specimens  during the acute phase of infection. The lowest concentration of SARS-CoV-2 viral copies this assay can detect is 250 copies / mL. A negative result does not preclude SARS-CoV-2 infection and should not be used as the sole basis for treatment or other patient management decisions.  A negative result may occur with improper specimen collection / handling, submission of specimen other than nasopharyngeal swab, presence of viral mutation(s) within the areas targeted by this assay, and inadequate number of viral copies (<250 copies / mL). A negative result must be combined with clinical observations, patient history, and epidemiological information. Fact Sheet for Patients:   StrictlyIdeas.no Fact Sheet for Healthcare Providers: BankingDealers.co.za This test is not yet approved or cleared  by  the [place] FDA and has been authorized for detection and/or diagnosis of SARS-CoV-2 by FDA under an Emergency Use Authorization (EUA).  This EUA will remain in effect (meaning this test can be used) for the duration of the COVID-19 declaration under Section 564(b)(1) of the Act, 21 U.S.C. section 360bbb-3(b)(1), unless the authorization is terminated or revoked sooner. Performed at [facility], [address] [place], [place] [address]   SARS Coronavirus 2 by RT PCR (hospital order, performed in [facility] hospital lab) Nasopharyngeal Nasopharyngeal Swab     Status: None   Collection Time: 07/08/19 12:40 PM   Specimen: Nasopharyngeal Swab  Result Value Ref Range Status   SARS Coronavirus 2 NEGATIVE NEGATIVE Final    Comment: (NOTE) SARS-CoV-2 target nucleic acids are NOT DETECTED. The SARS-CoV-2 RNA is generally detectable in upper and lower respiratory specimens during the acute phase of infection. The lowest concentration of SARS-CoV-2 viral copies this assay can detect is 250 copies / mL. A negative result does not preclude SARS-CoV-2 infection and should not be used as the sole basis for treatment or other patient management decisions.  A negative result may occur with improper specimen collection / handling, submission of specimen other than nasopharyngeal swab, presence of viral mutation(s) within the areas targeted by this assay, and inadequate number of viral copies (<250 copies / mL). A negative result must be combined with clinical observations, patient history, and epidemiological information. Fact Sheet for Patients:   StrictlyIdeas.no Fact Sheet for Healthcare Providers: BankingDealers.co.za This test is not yet approved or cleared  by the [place] FDA and has been authorized for detection and/or diagnosis of SARS-CoV-2 by FDA under an Emergency Use Authorization (EUA).  This EUA will remain in  effect (meaning this test can be used) for the duration of the COVID-19 declaration under Section 564(b)(1) of the Act, 21 U.S.C. section 360bbb-3(b)(1), unless the authorization is terminated or revoked sooner. Performed at [facility], [address] [place], [place] [address]      Labs: BNP (last 3 results) No results for input(s): BNP in the last 8760 hours. Basic Metabolic Panel: Recent Labs  Lab 07/08/19 0917 07/09/19 0418  NA 129* 132*  K 3.4* 3.5  CL 88* 91*  CO2 28 32  GLUCOSE 76 93  BUN 20 19  CREATININE 0.58 0.60  CALCIUM 9.1 8.6*   Liver Function Tests: No results for input(s): AST, ALT, ALKPHOS, BILITOT, PROT, ALBUMIN in the last 168 hours. No results for input(s): LIPASE, AMYLASE in the last 168 hours. No results for input(s): AMMONIA in the last 168 hours. CBC: Recent Labs  Lab 07/08/19 0917 07/09/19 0418  WBC 8.2 7.6  HGB 12.6 12.0  HCT 37.1 34.9*  MCV 89.6 88.4  PLT 313 304  Cardiac Enzymes: No results for input(s): CKTOTAL, CKMB, CKMBINDEX, TROPONINI in the last 168 hours. BNP: Invalid input(s): POCBNP CBG: No results for input(s): GLUCAP in the last 168 hours. D-Dimer No results for input(s): DDIMER in the last 72 hours. Hgb A1c No results for input(s): HGBA1C in the last 72 hours. Lipid Profile No results for input(s): CHOL, HDL, LDLCALC, TRIG, CHOLHDL, LDLDIRECT in the last 72 hours. Thyroid function studies No results for input(s): TSH, T4TOTAL, T3FREE, THYROIDAB in the last 72 hours.  Invalid input(s): FREET3 Anemia work up No results for input(s): VITAMINB12, FOLATE, FERRITIN, TIBC, IRON, RETICCTPCT in the last 72 hours. Urinalysis    Component Value Date/Time   COLORURINE YELLOW (A) 01/28/2017 1136   APPEARANCEUR HAZY (A) 01/28/2017 1136   LABSPEC 1.010 01/28/2017 1136   PHURINE 8.0 01/28/2017 1136   GLUCOSEU NEGATIVE 01/28/2017 1136   HGBUR NEGATIVE 01/28/2017 1136   BILIRUBINUR NEGATIVE 01/28/2017 1136    KETONESUR NEGATIVE 01/28/2017 1136   PROTEINUR NEGATIVE 01/28/2017 1136   NITRITE NEGATIVE 01/28/2017 1136   LEUKOCYTESUR NEGATIVE 01/28/2017 1136   Sepsis Labs Invalid input(s): PROCALCITONIN,  WBC,  LACTICIDVEN Microbiology Recent Results (from the past 240 hour(s))  SARS Coronavirus 2 by RT PCR (hospital order, performed in [place] hospital lab) Nasopharyngeal Nasopharyngeal Swab     Status: None   Collection Time: 06/30/19 11:46 AM   Specimen: Nasopharyngeal Swab  Result Value Ref Range Status   SARS Coronavirus 2 NEGATIVE NEGATIVE Final    Comment: (NOTE) SARS-CoV-2 target nucleic acids are NOT DETECTED. The SARS-CoV-2 RNA is generally detectable in upper and lower respiratory specimens during the acute phase of infection. The lowest concentration of SARS-CoV-2 viral copies this assay can detect is 250 copies / mL. A negative result does not preclude SARS-CoV-2 infection and should not be used as the sole basis for treatment or other patient management decisions.  A negative result may occur with improper specimen collection / handling, submission of specimen other than nasopharyngeal swab, presence of viral mutation(s) within the areas targeted by this assay, and inadequate number of viral copies (<250 copies / mL). A negative result must be combined with clinical observations, patient history, and epidemiological information. Fact Sheet for Patients:   StrictlyIdeas.no Fact Sheet for Healthcare Providers: BankingDealers.co.za This test is not yet approved or cleared  by the [place] FDA and has been authorized for detection and/or diagnosis of SARS-CoV-2 by FDA under an Emergency Use Authorization (EUA).  This EUA will remain in effect (meaning this test can be used) for the duration of the COVID-19 declaration under Section 564(b)(1) of the Act, 21 U.S.C. section 360bbb-3(b)(1), unless the authorization is terminated  or revoked sooner. Performed at [facility], [address] [place], [place] [address]   SARS Coronavirus 2 by RT PCR (hospital order, performed in [facility] hospital lab) Nasopharyngeal Nasopharyngeal Swab     Status: None   Collection Time: 07/08/19 12:40 PM   Specimen: Nasopharyngeal Swab  Result Value Ref Range Status   SARS Coronavirus 2 NEGATIVE NEGATIVE Final    Comment: (NOTE) SARS-CoV-2 target nucleic acids are NOT DETECTED. The SARS-CoV-2 RNA is generally detectable in upper and lower respiratory specimens during the acute phase of infection. The lowest concentration of SARS-CoV-2 viral copies this assay can detect is 250 copies / mL. A negative result does not preclude SARS-CoV-2 infection and should not be used as the sole basis for treatment or other patient management decisions.  A negative result may occur  with improper specimen collection / handling, submission of specimen other than nasopharyngeal swab, presence of viral mutation(s) within the areas targeted by this assay, and inadequate number of viral copies (<250 copies / mL). A negative result must be combined with clinical observations, patient history, and epidemiological information. Fact Sheet for Patients:   StrictlyIdeas.no Fact Sheet for Healthcare Providers: BankingDealers.co.za This test is not yet approved or cleared  by the [place] FDA and has been authorized for detection and/or diagnosis of SARS-CoV-2 by FDA under an Emergency Use Authorization (EUA).  This EUA will remain in effect (meaning this test can be used) for the duration of the COVID-19 declaration under Section 564(b)(1) of the Act, 21 U.S.C. section 360bbb-3(b)(1), unless the authorization is terminated or revoked sooner. Performed at [facility], [address] [place], [place] [address]     Time coordinating discharge: Over 30  minutes  SIGNED:  Lorella Nimrod, MD  [facility] 07/10/2019, 11:41 AM  If 7PM-7AM, please contact night-coverage [email or web address]  This record has been created using Systems analyst. Errors have been sought and corrected,but may not always be located. Such creation errors do not reflect on the standard of care.

## 2019-07-10 NOTE — Progress Notes (Signed)
Katie Harding to be D/C'd Home per MD order.  Discussed prescriptions and follow up appointments with the patient. Prescriptions given to patient, medication list explained in detail. Pt verbalized understanding.  Allergies as of 07/10/2019      Reactions   Nsaids Other (See Comments)   Causes gastric burning, pain. History of gastric ulcer.   Hydrochlorothiazide Other (See Comments)   CAUSED A SEVERE DROP IN POTASSIUM LEVELS REQUIRING HOSPITALIZATION      Medication List    STOP taking these medications   famotidine 40 MG tablet Commonly known as: PEPCID     TAKE these medications   acetaminophen 325 MG tablet Commonly known as: TYLENOL Take 650 mg by mouth every 4 (four) hours as needed. For pain / increased temp Max dose for 24 hrs is 3000 mg from all sources of Apap/tyenol   ALPRAZolam 0.25 MG tablet Commonly known as: XANAX Take 0.5 tablets (0.125 mg total) by mouth 4 (four) times daily.   aspirin 81 MG EC tablet Take 1 tablet (81 mg total) by mouth daily. Swallow whole. Start taking on: July 11, 2019   atorvastatin 40 MG tablet Commonly known as: LIPITOR Take 1 tablet (40 mg total) by mouth daily. Start taking on: July 11, 2019   bisacodyl 5 MG EC tablet Commonly known as: DULCOLAX Take 5 mg by mouth daily as needed for moderate constipation.   calcium citrate-vitamin D 315-200 MG-UNIT tablet Commonly known as: CITRACAL+D Take 2 tablets by mouth 2 (two) times daily.   Cholecalciferol 25 MCG (1000 UT) capsule Take 2,000 Units by mouth daily. 2 caps   co-enzyme Q-10 30 MG capsule Take 30 mg by mouth daily.   diltiazem 120 MG 24 hr tablet Commonly known as: CARDIZEM LA Take 120 mg by mouth daily.   feeding supplement (PRO-STAT SUGAR FREE 64) Liqd Take 30 mLs by mouth 2 (two) times daily between meals.   furosemide 40 MG tablet Commonly known as: Lasix Take 1 tablet (40 mg total) by mouth daily.   HYDROcodone-acetaminophen 7.5-325 MG tablet Commonly  known as: NORCO Take 1 tablet by mouth every 4 (four) hours as needed.   indapamide 1.25 MG tablet Commonly known as: LOZOL Take 1.25 mg by mouth daily as needed.   magnesium oxide 400 MG tablet Commonly known as: MAG-OX Take 400 mg by mouth 2 (two) times daily.   metoCLOPramide 5 MG tablet Commonly known as: REGLAN Take 5 mg by mouth 3 (three) times daily.   multivitamin tablet Take 1 tablet by mouth daily.   omeprazole 40 MG capsule Commonly known as: PRILOSEC Take 40 mg by mouth 2 (two) times daily.   polyethylene glycol 17 g packet Commonly known as: MIRALAX / GLYCOLAX Take 17 g by mouth daily.   potassium chloride SA 20 MEQ tablet Commonly known as: KLOR-CON Take 1 tablet (20 mEq total) by mouth daily. Start taking on: July 11, 2019   propranolol 40 MG tablet Commonly known as: INDERAL Take 40 mg by mouth 3 (three) times daily.   sucralfate 1 g tablet Commonly known as: CARAFATE Take 1 g by mouth 3 (three) times daily.       Vitals:   07/10/19 0722 07/10/19 1126  BP: 120/75 107/65  Pulse: 67 64  Resp: 18 18  Temp: 98.1 F (36.7 C) 98 F (36.7 C)  SpO2: 98% 99%    Tele box removed and returned. Skin clean, dry and intact without evidence of skin break down, no evidence of  skin tears noted. IV catheter discontinued intact. Site without signs and symptoms of complications. Dressing and pressure applied. Pt denies pain at this time. No complaints noted.  An After Visit Summary was printed and given to the patient. Patient escorted via [place], and D/C home via private auto.  Rolley Sims

## 2019-07-16 ENCOUNTER — Ambulatory Visit: Payer: Medicare PPO | Attending: Family | Admitting: Family

## 2019-07-16 ENCOUNTER — Encounter: Payer: Self-pay | Admitting: Family

## 2019-07-16 ENCOUNTER — Other Ambulatory Visit: Payer: Self-pay

## 2019-07-16 VITALS — BP 123/72 | HR 63 | Resp 16 | Ht 67.0 in | Wt 104.0 lb

## 2019-07-16 DIAGNOSIS — R0602 Shortness of breath: Secondary | ICD-10-CM | POA: Diagnosis not present

## 2019-07-16 DIAGNOSIS — R5383 Other fatigue: Secondary | ICD-10-CM | POA: Insufficient documentation

## 2019-07-16 DIAGNOSIS — K219 Gastro-esophageal reflux disease without esophagitis: Secondary | ICD-10-CM | POA: Insufficient documentation

## 2019-07-16 DIAGNOSIS — M549 Dorsalgia, unspecified: Secondary | ICD-10-CM | POA: Insufficient documentation

## 2019-07-16 DIAGNOSIS — Z7901 Long term (current) use of anticoagulants: Secondary | ICD-10-CM | POA: Insufficient documentation

## 2019-07-16 DIAGNOSIS — F419 Anxiety disorder, unspecified: Secondary | ICD-10-CM | POA: Insufficient documentation

## 2019-07-16 DIAGNOSIS — I5032 Chronic diastolic (congestive) heart failure: Secondary | ICD-10-CM | POA: Diagnosis not present

## 2019-07-16 DIAGNOSIS — Z79899 Other long term (current) drug therapy: Secondary | ICD-10-CM | POA: Diagnosis not present

## 2019-07-16 DIAGNOSIS — R079 Chest pain, unspecified: Secondary | ICD-10-CM | POA: Insufficient documentation

## 2019-07-16 DIAGNOSIS — Z7982 Long term (current) use of aspirin: Secondary | ICD-10-CM | POA: Insufficient documentation

## 2019-07-16 DIAGNOSIS — I341 Nonrheumatic mitral (valve) prolapse: Secondary | ICD-10-CM | POA: Insufficient documentation

## 2019-07-16 DIAGNOSIS — M419 Scoliosis, unspecified: Secondary | ICD-10-CM | POA: Diagnosis not present

## 2019-07-16 DIAGNOSIS — Z8249 Family history of ischemic heart disease and other diseases of the circulatory system: Secondary | ICD-10-CM | POA: Diagnosis not present

## 2019-07-16 DIAGNOSIS — Z8711 Personal history of peptic ulcer disease: Secondary | ICD-10-CM | POA: Diagnosis not present

## 2019-07-16 DIAGNOSIS — M1990 Unspecified osteoarthritis, unspecified site: Secondary | ICD-10-CM | POA: Insufficient documentation

## 2019-07-16 DIAGNOSIS — I890 Lymphedema, not elsewhere classified: Secondary | ICD-10-CM | POA: Insufficient documentation

## 2019-07-16 DIAGNOSIS — I480 Paroxysmal atrial fibrillation: Secondary | ICD-10-CM

## 2019-07-16 NOTE — Patient Instructions (Signed)
Continue weighing daily and call for an overnight weight gain of > 2 pounds or a weekly weight gain of >5 pounds. 

## 2019-07-16 NOTE — Progress Notes (Signed)
Patient ID: Katie Harding, female    DOB: 1940-09-20, 79 y.o.   MRN: [id number]  HPI  Ms Flam is a 79 y/o female with a history of breast cancer, GERD, atrial fibrillation, mitral valve prolapse, osteoporosis, anxiety and chronic heart failure.   Echo report from 07/08/19 reviewed and showed an EF of 60-65% along with mildly elevated PA pressure, mild MR and moderate/ severe TR.   Admitted 07/08/19 due to chest pain along with worsening pedal edema. Cardiology consult obtained. Given IV lasix with transition to oral diuretics. Discharged after 2 days.   She presents today for her initial visit with a chief complaint of moderate fatigue upon minimal exertion. She describes this as chronic in nature having been present for many years. She has associated shortness of breath, chest pain, pedal edema, back pain and slight weight gain along with this. She denies any difficulty sleeping, dizziness, abdominal distention, palpitations or cough.   Currently on prednisone to try and help with her back pain.   Past Medical History:  Diagnosis Date  . #[id number] 2006  . Anxiety   . Arrhythmia    atrial fibrillation  . Arthritis    osteoporosis  . Breast cancer [facility]) 2006   right breast lumpectomy with radiation. no lymph nodes involved  . CHF (congestive heart failure) ([place])   . Colon polyp    adenoma  . Complication of anesthesia   . Constipation 04/2019   on a new bowel regime  . Dyspnea   . Dysrhythmia    ATRIAL FIBRILLATION  . Fracture of vertebra due to osteoporosis ([facility])    history of L1, L2 and now T12  . GERD (gastroesophageal reflux disease) 04/04/2015  . Heart disease   . History of gastric ulcer 04/2001  . History of right breast cancer    post surgery/ XRT  . MVP (mitral valve prolapse)    dental prophylaxis  . Osteoporosis   . Osteoporosis, post-menopausal   . Personal history of radiation therapy 2006   right breast ca  . PONV (postoperative nausea and vomiting)     NAUSEA ON SEVERAL OCCASIONS  . Scoliosis   . Surgical menopause    Past Surgical History:  Procedure Laterality Date  . BREAST BIOPSY Right 2006   breast ca  . BREAST LUMPECTOMY Right 2006   breast ca  . CATARACT EXTRACTION    . CHOLECYSTECTOMY    . 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 COLONOSCOPY  11/29/2004   PH Adenomatous Polyps, [facility] (Brother)  . COLONOSCOPY  03/14/2010   PH Adenomatous Polyps, [facility] (Brother); CBF 03/2015, Ltr mailed 01/19/2015 (dw)  . COLONOSCOPY  05/05/2015   PH Adenomatous Polyps, [facility] (Brother); No repeat due to age per RTE (dw)  . COLONOSCOPY WITH PROPOFOL N/A 05/05/2015   Procedure: COLONOSCOPY WITH PROPOFOL;  Surgeon: Manya Silvas, MD;  Location: [facility] ENDOSCOPY;  Service: Endoscopy;  Laterality: N/A;  . ESOPHAGOGASTRODUODENOSCOPY  12/24/2006   01/06/2004, 08/27/2001, 06/18/2001  . ESOPHAGOGASTRODUODENOSCOPY (EGD) WITH PROPOFOL N/A 05/05/2015   Procedure: ESOPHAGOGASTRODUODENOSCOPY (EGD) WITH PROPOFOL;  Surgeon: Manya Silvas, MD;  Location: [facility] ENDOSCOPY;  Service: Endoscopy;  Laterality: N/A;  . EYE SURGERY Bilateral    cataracts removed  . KYPHOPLASTY N/A 06/08/2019   Procedure: T12 KYPHOPLASTY;  Surgeon: Hessie Knows, MD;  Location: [facility] ORS;  Service: Orthopedics;  Laterality: N/A;  . KYPHOPLASTY N/A 06/30/2019   Procedure: T 11 KYPHOPLASTY;  Surgeon: Hessie Knows, MD;  Location: [facility]  ORS;  Service: Orthopedics;  Laterality: N/A;  . NASAL SEPTUM SURGERY    . NASAL SEPTUM SURGERY    . ovarian cyst removed    . ROTATOR CUFF REPAIR Right   . TONSILLECTOMY    . TONSILLECTOMY    . TOTAL ABDOMINAL HYSTERECTOMY W/ BILATERAL SALPINGOOPHORECTOMY  1980s   for endometriosis in 1980s   Family History  Problem Relation Age of Onset  . Breast cancer Sister 34  . Multiple myeloma Sister   . Breast cancer Maternal Aunt 60  . Breast cancer Maternal Aunt 60  . Colon cancer Brother   . Intracerebral hemorrhage Mother   . CVA  Mother   . Hypertension Mother   . Stroke Mother   . Basal cell carcinoma Mother   . Melanoma Neg Hx    Social History   Tobacco Use  . Smoking status: Never Smoker  . Smokeless tobacco: Never Used  Substance Use Topics  . Alcohol use: No   Allergies  Allergen Reactions  . Nsaids Other (See Comments)    Causes gastric burning, pain. History of gastric ulcer.  . Hydrochlorothiazide Other (See Comments)    CAUSED A SEVERE DROP IN POTASSIUM LEVELS REQUIRING HOSPITALIZATION   Prior to Admission medications   Medication Sig Start Date End Date Taking? Authorizing Provider  acetaminophen (TYLENOL) 325 MG tablet Take 650 mg by mouth every 4 (four) hours as needed. For pain / increased temp Max dose for 24 hrs is 3000 mg from all sources of Apap/tyenol   Yes [provider]  ALPRAZolam (XANAX) 0.25 MG tablet Take 0.5 tablets (0.125 mg total) by mouth 4 (four) times daily. 08/24/16  Yes Toni Arthurs, NP  Amino Acids-Protein Hydrolys (FEEDING SUPPLEMENT, PRO-STAT SUGAR FREE 64,) LIQD Take 30 mLs by mouth 2 (two) times daily between meals.   Yes [provider]  aspirin EC 81 MG EC tablet Take 1 tablet (81 mg total) by mouth daily. Swallow whole. 07/11/19  Yes Lorella Nimrod, MD  atorvastatin (LIPITOR) 40 MG tablet Take 1 tablet (40 mg total) by mouth daily. 07/11/19  Yes Lorella Nimrod, MD  bisacodyl (DULCOLAX) 5 MG EC tablet Take 5 mg by mouth daily as needed for moderate constipation.   Yes [provider]  calcium citrate-vitamin D (CITRACAL+D) 315-200 MG-UNIT tablet Take 2 tablets by mouth 2 (two) times daily.    Yes [provider]  Cholecalciferol 1000 units capsule Take 2,000 Units by mouth daily. 2 caps   Yes [provider]  co-enzyme Q-10 30 MG capsule Take 30 mg by mouth daily.   Yes [provider]  diltiazem (CARDIZEM LA) 120 MG 24 hr tablet Take 120 mg by mouth daily.   Yes [provider]  furosemide (LASIX) 40 MG  tablet Take 1 tablet (40 mg total) by mouth daily. 07/10/19 07/09/20 Yes Lorella Nimrod, MD  HYDROcodone-acetaminophen (NORCO) 7.5-325 MG tablet Take 1 tablet by mouth every 4 (four) hours as needed. 06/30/19  Yes Hessie Knows, MD  indapamide (LOZOL) 1.25 MG tablet Take 1.25 mg by mouth daily as needed. 06/18/19  Yes [provider]  magnesium oxide (MAG-OX) 400 MG tablet Take 400 mg by mouth 2 (two) times daily.   Yes [provider]  metoCLOPramide (REGLAN) 5 MG tablet Take 5 mg by mouth 3 (three) times daily. 06/01/19 07/16/19 Yes [provider]  Multiple Vitamin (MULTIVITAMIN) tablet Take 1 tablet by mouth daily.   Yes [provider]  omeprazole (PRILOSEC) 40 MG capsule  Take 40 mg by mouth 2 (two) times daily.   Yes [provider]  polyethylene glycol (MIRALAX / GLYCOLAX) packet Take 17 g by mouth daily.    Yes [provider]  potassium chloride SA (KLOR-CON) 20 MEQ tablet Take 1 tablet (20 mEq total) by mouth daily. 07/11/19  Yes Lorella Nimrod, MD  predniSONE (STERAPRED UNI-PAK 21 TAB) 10 MG (21) TBPK tablet Take by mouth daily.   Yes [provider]  propranolol (INDERAL) 40 MG tablet Take 40 mg by mouth 3 (three) times daily.   Yes [provider]  sucralfate (CARAFATE) 1 g tablet Take 1 g by mouth 3 (three) times daily. 04/16/19  Yes [provider]    Review of Systems  Constitutional: Positive for fatigue (easily). Negative for appetite change.  HENT: Positive for rhinorrhea. Negative for congestion and sore throat.   Eyes: Negative.   Respiratory: Positive for shortness of breath (easily). Negative for cough.   Cardiovascular: Positive for chest pain (at times) and leg swelling. Negative for palpitations.  Gastrointestinal: Negative for abdominal distention and abdominal pain.  Endocrine: Negative.   Genitourinary: Negative.   Musculoskeletal: Positive for back pain.  Skin: Negative.   Allergic/Immunologic:  Negative.   Neurological: Negative for dizziness and light-headedness.  Hematological: Negative for adenopathy. Does not bruise/bleed easily.  Psychiatric/Behavioral: Negative for dysphoric mood and sleep disturbance. The patient is not nervous/anxious.     Vitals:   07/16/19 1111  BP: 123/72  Pulse: 63  Resp: 16  SpO2: 97%  Weight: 104 lb (47.2 kg)  Height: 5' 7"  (1.702 m)   Wt Readings from Last 3 Encounters:  07/16/19 104 lb (47.2 kg)  07/10/19 100 lb 3.2 oz (45.5 kg)  06/30/19 100 lb (45.4 kg)   Lab Results  Component Value Date   CREATININE 0.60 07/09/2019   CREATININE 0.58 07/08/2019   CREATININE 0.54 06/08/2019     Physical Exam Vitals and nursing note reviewed.  Constitutional:      Appearance: She is well-developed.  HENT:     Head: Normocephalic and atraumatic.  Neck:     Vascular: No JVD.  Cardiovascular:     Rate and Rhythm: Normal rate and regular rhythm.  Pulmonary:     Effort: Pulmonary effort is normal. No respiratory distress.     Breath sounds: No wheezing or rales.  Musculoskeletal:     Cervical back: Normal range of motion and neck supple.     Right lower leg: No tenderness. Edema (2+ pitting) present.     Left lower leg: No tenderness. Edema (2+ pitting) present.  Skin:    General: Skin is warm and dry.  Neurological:     General: No focal deficit present.     Mental Status: She is alert and oriented to person, place, and time.  Psychiatric:        Mood and Affect: Mood normal.        Behavior: Behavior normal.    Assessment & Plan:  1: Chronic heart failure with preserved ejection fraction- - NYHA class III - euvolemic today - weighing daily and has gained 2 pounds at home in the last 5 days; instructed to call for an overnight weight gain of >2 pounds or a weekly weight gain of >5 pounds - adds "very little" salt but does live at [facility] where the food is prepared for her; last night she received grilled cheese sandwich and  potato chips; has a history of being hyponatremic - sees  cardiology Nehemiah Massed) 07/17/19 (does have some valvular disease) - has received both her COVID vaccines  2: Atrial fibrillation- - saw PCP Sabra Heck) 07/08/19 & returns on 07/20/19 - BMP from 07/09/19 reviewed and showed sodium 135, potassium 3.5, creatinine 0.6 and GFR >60  3: Lymphedema- - stage 2 - limited in her ability to exercise due to her symptoms and her back pain - has worn compression socks in the past; encouraged her to wear them daily with putting them on every morning and remove them at bedtime - encouraged to elevate her legs when sitting for long periods of time - consider lymphapress compression boots if edema persists   Patient did not bring her medications nor a list. Each medication was verbally reviewed with the patient and she was encouraged to bring the bottles to every visit to confirm accuracy of list.  Return in 2 months or sooner for any questions/problems before then.

## 2019-07-17 DIAGNOSIS — I5032 Chronic diastolic (congestive) heart failure: Secondary | ICD-10-CM | POA: Diagnosis present

## 2019-09-11 ENCOUNTER — Ambulatory Visit: Payer: Medicare PPO | Admitting: Family

## 2019-09-15 ENCOUNTER — Ambulatory Visit: Payer: Medicare PPO | Admitting: Family

## 2019-09-19 ENCOUNTER — Observation Stay
Admission: EM | Admit: 2019-09-19 | Discharge: 2019-09-22 | Disposition: A | Discharge status: Home / Self Care | Payer: Medicare PPO | Attending: Internal Medicine | Admitting: Internal Medicine

## 2019-09-19 ENCOUNTER — Emergency Department: Payer: Medicare PPO

## 2019-09-19 ENCOUNTER — Other Ambulatory Visit: Payer: Self-pay

## 2019-09-19 DIAGNOSIS — W08XXXA Fall from other furniture, initial encounter: Secondary | ICD-10-CM | POA: Insufficient documentation

## 2019-09-19 DIAGNOSIS — Z853 Personal history of malignant neoplasm of breast: Secondary | ICD-10-CM | POA: Insufficient documentation

## 2019-09-19 DIAGNOSIS — S2249XA Multiple fractures of ribs, unspecified side, initial encounter for closed fracture: Secondary | ICD-10-CM | POA: Insufficient documentation

## 2019-09-19 DIAGNOSIS — M25512 Pain in left shoulder: Secondary | ICD-10-CM

## 2019-09-19 DIAGNOSIS — G8911 Acute pain due to trauma: Secondary | ICD-10-CM

## 2019-09-19 DIAGNOSIS — Y999 Unspecified external cause status: Secondary | ICD-10-CM | POA: Insufficient documentation

## 2019-09-19 DIAGNOSIS — S42019A Nondisplaced fracture of sternal end of unspecified clavicle, initial encounter for closed fracture: Principal | ICD-10-CM | POA: Insufficient documentation

## 2019-09-19 DIAGNOSIS — W19XXXA Unspecified fall, initial encounter: Secondary | ICD-10-CM

## 2019-09-19 DIAGNOSIS — Z7982 Long term (current) use of aspirin: Secondary | ICD-10-CM | POA: Diagnosis not present

## 2019-09-19 DIAGNOSIS — Z79899 Other long term (current) drug therapy: Secondary | ICD-10-CM | POA: Insufficient documentation

## 2019-09-19 DIAGNOSIS — I509 Heart failure, unspecified: Secondary | ICD-10-CM | POA: Insufficient documentation

## 2019-09-19 DIAGNOSIS — Y9389 Activity, other specified: Secondary | ICD-10-CM | POA: Diagnosis not present

## 2019-09-19 DIAGNOSIS — Y92009 Unspecified place in unspecified non-institutional (private) residence as the place of occurrence of the external cause: Secondary | ICD-10-CM | POA: Diagnosis not present

## 2019-09-19 DIAGNOSIS — M419 Scoliosis, unspecified: Secondary | ICD-10-CM | POA: Diagnosis not present

## 2019-09-19 DIAGNOSIS — I480 Paroxysmal atrial fibrillation: Secondary | ICD-10-CM | POA: Diagnosis not present

## 2019-09-19 DIAGNOSIS — T07XXXA Unspecified multiple injuries, initial encounter: Secondary | ICD-10-CM

## 2019-09-19 DIAGNOSIS — I5032 Chronic diastolic (congestive) heart failure: Secondary | ICD-10-CM | POA: Diagnosis present

## 2019-09-19 DIAGNOSIS — R54 Age-related physical debility: Secondary | ICD-10-CM

## 2019-09-19 DIAGNOSIS — S39012A Strain of muscle, fascia and tendon of lower back, initial encounter: Secondary | ICD-10-CM | POA: Diagnosis not present

## 2019-09-19 DIAGNOSIS — R296 Repeated falls: Secondary | ICD-10-CM

## 2019-09-19 LAB — COMPREHENSIVE METABOLIC PANEL
ALT: 14 U/L (ref 0–44)
AST: 21 U/L (ref 15–41)
Albumin: 4.2 g/dL (ref 3.5–5.0)
Alkaline Phosphatase: 297 U/L — ABNORMAL HIGH (ref 38–126)
Anion gap: 13 (ref 5–15)
BUN: 30 mg/dL — ABNORMAL HIGH (ref 8–23)
CO2: 29 mmol/L (ref 22–32)
Calcium: 9.9 mg/dL (ref 8.9–10.3)
Chloride: 92 mmol/L — ABNORMAL LOW (ref 98–111)
Creatinine, Ser: 0.72 mg/dL (ref 0.44–1.00)
GFR calc Af Amer: >60 mL/min (ref >60)
GFR calc non Af Amer: >60 mL/min (ref >60)
Glucose, Bld: 93 mg/dL (ref 70–99)
Potassium: 4.1 mmol/L (ref 3.5–5.1)
Sodium: 134 mmol/L — ABNORMAL LOW (ref 135–145)
Total Bilirubin: 1 mg/dL (ref 0.3–1.2)
Total Protein: 7 g/dL (ref 6.5–8.1)

## 2019-09-19 LAB — CBC
HCT: 34.7 % — ABNORMAL LOW (ref 36.0–46.0)
Hemoglobin: 11.7 g/dL — ABNORMAL LOW (ref 12.0–15.0)
MCH: 31.3 pg (ref 26.0–34.0)
MCHC: 33.7 g/dL (ref 30.0–36.0)
MCV: 92.8 fL (ref 80.0–100.0)
Platelets: 270 10*3/uL (ref 150–400)
RBC: 3.74 MIL/uL — ABNORMAL LOW (ref 3.87–5.11)
RDW: 13.8 % (ref 11.5–15.5)
WBC: 8.1 10*3/uL (ref 4.0–10.5)
nRBC: 0 % (ref 0.0–0.2)

## 2019-09-19 LAB — BRAIN NATRIURETIC PEPTIDE: B Natriuretic Peptide: 344.3 pg/mL — ABNORMAL HIGH (ref 0.0–100.0)

## 2019-09-19 MED ORDER — FUROSEMIDE 40 MG PO TABS
20.0000 mg | ORAL_TABLET | Freq: Every day | ORAL | 0 refills | Status: DC
Start: 2019-09-19 — End: 2019-09-19

## 2019-09-19 MED ORDER — FUROSEMIDE 40 MG PO TABS
20.0000 mg | ORAL_TABLET | Freq: Every day | ORAL | 0 refills | Status: AC
Start: 2019-09-19 — End: 2019-10-09

## 2019-09-19 MED ORDER — ACETAMINOPHEN 325 MG PO TABS
650.0000 mg | ORAL_TABLET | Freq: Once | ORAL | Status: AC
  Administered 2019-09-20: 650 mg via ORAL
  Filled 2019-09-19: qty 2

## 2019-09-19 MED ORDER — DIAZEPAM 2 MG PO TABS: 2.0000 mg | ORAL_TABLET | Freq: Three times a day (TID) | ORAL | 0 refills | Status: AC | PRN

## 2019-09-19 MED ORDER — OXYCODONE-ACETAMINOPHEN 5-325 MG PO TABS
1.0000 | ORAL_TABLET | Freq: Once | ORAL | Status: AC
  Administered 2019-09-20: 1 via ORAL
  Filled 2019-09-19: qty 1

## 2019-09-19 NOTE — ED Notes (Signed)
Assisted pt to restroom  

## 2019-09-19 NOTE — ED Notes (Signed)
Report given to Meagan RN.

## 2019-09-19 NOTE — ED Notes (Signed)
Dr. Joni Fears with patient.

## 2019-09-19 NOTE — ED Notes (Signed)
Patient given update on wait time. Patient verbalizes understanding.  

## 2019-09-19 NOTE — ED Provider Notes (Signed)
[facility] Emergency Department Provider Note  ____________________________________________  Time seen: Approximately 11:13 PM  I have reviewed the triage vital signs and the nursing notes.   HISTORY  Chief Complaint Back Pain and Leg Swelling    HPI Katie Harding is a 79 y.o. female with a history of anxiety, atrial fibrillation, CHF, GERD who primarily complains of acute on chronic low back pain and left shoulder pain starting today.  She was in her usual state of health which includes chronic low back pain status post multiple kyphoplasties when she had a mechanical fall at home over the edge of the couch.  After that, she started having left shoulder pain, and then when she woke up this morning her back pain was much worse, feels severe, nonradiating, worse with movement, no alleviating factors.  Tried hydrocodone at home without relief.   No lower extremity weakness or paresthesias, no bowel or bladder retention or incontinence.  She also complains of gradually worsening chronic bilateral lower extremity edema.  She has been following up with her doctor regarding this for a long time and has been referred to the heart failure clinic.  She had been continuing to have gradually worsening edema on her previous medication regimen, as a few days ago she was switched to Bumex.  She notes that over the last 2 days since switching to Bumex she has gained an additional 2 pounds.  Bilateral legs were wrapped as well.  Not having pronounced DOE or orthopnea on top of baseline.  She has an appointment scheduled with the heart failure clinic for Wednesday.   Past Medical History:  Diagnosis Date  . #[id number] 2006  . Anxiety   . Arrhythmia    atrial fibrillation  . Arthritis    osteoporosis  . Breast cancer [facility]) 2006   right breast lumpectomy with radiation. no lymph nodes involved  . CHF (congestive heart failure) ([place])   . Colon polyp    adenoma  . Complication of  anesthesia   . Constipation 04/2019   on a new bowel regime  . Dyspnea   . Dysrhythmia    ATRIAL FIBRILLATION  . Fracture of vertebra due to osteoporosis (HCC)    history of L1, L2 and now T12  . GERD (gastroesophageal reflux disease) 04/04/2015  . Heart disease   . History of gastric ulcer 04/2001  . History of right breast cancer    post surgery/ XRT  . MVP (mitral valve prolapse)    dental prophylaxis  . Osteoporosis   . Osteoporosis, post-menopausal   . Personal history of radiation therapy 2006   right breast ca  . PONV (postoperative nausea and vomiting)    NAUSEA ON SEVERAL OCCASIONS  . Scoliosis   . Surgical menopause      Patient Active Problem List   Diagnosis Date Noted  . Nonspecific chest pain 07/08/2019  . Protein-calorie malnutrition, severe 01/30/2017  . Hyponatremia 01/28/2017  . Neuropathy 09/19/2016  . Urinary retention 09/19/2016  . Closed fracture of multiple pubic rami ([place]) 08/20/2016  . GERD (gastroesophageal reflux disease) 04/04/2015  . Hypokalemia 04/04/2015  . Acute CHF (congestive heart failure) ([place]) 04/04/2015     Past Surgical History:  Procedure Laterality Date  . BREAST BIOPSY Right 2006   breast ca  . BREAST LUMPECTOMY Right 2006   breast ca  . CATARACT EXTRACTION    . CHOLECYSTECTOMY    . 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 COLONOSCOPY  11/29/2004   PH Adenomatous Polyps, [facility] (Brother)  . COLONOSCOPY  03/14/2010   PH Adenomatous Polyps, [facility] (Brother); CBF 03/2015, Ltr mailed 01/19/2015 (dw)  . COLONOSCOPY  05/05/2015   PH Adenomatous Polyps, [facility] (Brother); No repeat due to age per RTE (dw)  . COLONOSCOPY WITH PROPOFOL N/A 05/05/2015   Procedure: COLONOSCOPY WITH PROPOFOL;  Surgeon: Manya Silvas, MD;  Location: [facility] ENDOSCOPY;  Service: Endoscopy;  Laterality: N/A;  . ESOPHAGOGASTRODUODENOSCOPY  12/24/2006   01/06/2004, 08/27/2001, 06/18/2001  . ESOPHAGOGASTRODUODENOSCOPY (EGD) WITH PROPOFOL  N/A 05/05/2015   Procedure: ESOPHAGOGASTRODUODENOSCOPY (EGD) WITH PROPOFOL;  Surgeon: Manya Silvas, MD;  Location: [facility] ENDOSCOPY;  Service: Endoscopy;  Laterality: N/A;  . EYE SURGERY Bilateral    cataracts removed  . KYPHOPLASTY N/A 06/08/2019   Procedure: T12 KYPHOPLASTY;  Surgeon: Hessie Knows, MD;  Location: [facility] ORS;  Service: Orthopedics;  Laterality: N/A;  . KYPHOPLASTY N/A 06/30/2019   Procedure: T 11 KYPHOPLASTY;  Surgeon: Hessie Knows, MD;  Location: [facility] ORS;  Service: Orthopedics;  Laterality: N/A;  . NASAL SEPTUM SURGERY    . NASAL SEPTUM SURGERY    . ovarian cyst removed    . ROTATOR CUFF REPAIR Right   . TONSILLECTOMY    . TONSILLECTOMY    . TOTAL ABDOMINAL HYSTERECTOMY W/ BILATERAL SALPINGOOPHORECTOMY  1980s   for endometriosis in 1980s     Prior to Admission medications   Medication Sig Start Date End Date Taking? Authorizing Provider  acetaminophen (TYLENOL) 325 MG tablet Take 650 mg by mouth every 4 (four) hours as needed. For pain / increased temp Max dose for 24 hrs is 3000 mg from all sources of Apap/tyenol    [provider]  ALPRAZolam (XANAX) 0.25 MG tablet Take 0.5 tablets (0.125 mg total) by mouth 4 (four) times daily. 08/24/16   Toni Arthurs, NP  Amino Acids-Protein Hydrolys (FEEDING SUPPLEMENT, PRO-STAT SUGAR FREE 64,) LIQD Take 30 mLs by mouth 2 (two) times daily between meals.    [provider]  aspirin EC 81 MG EC tablet Take 1 tablet (81 mg total) by mouth daily. Swallow whole. 07/11/19   Lorella Nimrod, MD  atorvastatin (LIPITOR) 40 MG tablet Take 1 tablet (40 mg total) by mouth daily. 07/11/19   Lorella Nimrod, MD  bisacodyl (DULCOLAX) 5 MG EC tablet Take 5 mg by mouth daily as needed for moderate constipation.    [provider]  calcium citrate-vitamin D (CITRACAL+D) 315-200 MG-UNIT tablet Take 2 tablets by mouth 2 (two) times daily.     [provider]  Cholecalciferol 1000 units capsule Take 2,000 Units by mouth  daily. 2 caps    [provider]  co-enzyme Q-10 30 MG capsule Take 30 mg by mouth daily.    [provider]  diazepam (VALIUM) 2 MG tablet Take 1 tablet (2 mg total) by mouth every 8 (eight) hours as needed for up to 5 days for muscle spasms. 09/19/19 09/24/19  Carrie Mew, MD  diltiazem (CARDIZEM LA) 120 MG 24 hr tablet Take 120 mg by mouth daily.    [provider]  furosemide (LASIX) 40 MG tablet Take 0.5 tablets (20 mg total) by mouth daily for 20 days. 09/19/19 10/09/19  Carrie Mew, MD  HYDROcodone-acetaminophen (NORCO) 7.5-325 MG tablet Take 1 tablet by mouth every 4 (four) hours as needed. 06/30/19   Hessie Knows, MD  indapamide (LOZOL) 1.25 MG tablet Take 1.25 mg by mouth daily as needed. 06/18/19   [provider]  magnesium oxide (  MAG-OX) 400 MG tablet Take 400 mg by mouth 2 (two) times daily.    [provider]  metoCLOPramide (REGLAN) 5 MG tablet Take 5 mg by mouth 3 (three) times daily. 06/01/19 07/16/19  [provider]  Multiple Vitamin (MULTIVITAMIN) tablet Take 1 tablet by mouth daily.    [provider]  omeprazole (PRILOSEC) 40 MG capsule Take 40 mg by mouth 2 (two) times daily.    [provider]  polyethylene glycol (MIRALAX / GLYCOLAX) packet Take 17 g by mouth daily.     [provider]  potassium chloride SA (KLOR-CON) 20 MEQ tablet Take 1 tablet (20 mEq total) by mouth daily. 07/11/19   Lorella Nimrod, MD  predniSONE (STERAPRED UNI-PAK 21 TAB) 10 MG (21) TBPK tablet Take by mouth daily.    [provider]  propranolol (INDERAL) 40 MG tablet Take 40 mg by mouth 3 (three) times daily.    [provider]  sucralfate (CARAFATE) 1 g tablet Take 1 g by mouth 3 (three) times daily. 04/16/19   [provider]     Allergies Nsaids and Hydrochlorothiazide   Family History  Problem Relation Age of Onset  . Breast cancer Sister 58  . Multiple myeloma Sister   . Breast  cancer Maternal Aunt 60  . Breast cancer Maternal Aunt 60  . Colon cancer Brother   . Intracerebral hemorrhage Mother   . CVA Mother   . Hypertension Mother   . Stroke Mother   . Basal cell carcinoma Mother   . Melanoma Neg Hx     Social History Social History   Tobacco Use  . Smoking status: Never Smoker  . Smokeless tobacco: Never Used  Vaping Use  . Vaping Use: Never used  Substance Use Topics  . Alcohol use: No  . Drug use: No    Review of Systems  Constitutional:   No fever or chills.  ENT:   No sore throat. No rhinorrhea. Cardiovascular:   No chest pain or syncope. Respiratory:   No dyspnea or cough. Gastrointestinal:   Negative for abdominal pain, vomiting and diarrhea.  Musculoskeletal:   Left shoulder pain.  Acute on chronic low back pain. All other systems reviewed and are negative except as documented above in ROS and HPI.  ____________________________________________   PHYSICAL EXAM:  VITAL SIGNS: ED Triage Vitals  Enc Vitals Group     BP 09/19/19 1029 (!) 154/94     Pulse Rate 09/19/19 1029 68     Resp 09/19/19 1029 18     Temp 09/19/19 1029 (!) 97.5 F (36.4 C)     Temp Source 09/19/19 1029 Oral     SpO2 09/19/19 1029 98 %     Weight 09/19/19 1051 109 lb (49.4 kg)     Height 09/19/19 1051 5' 6" (1.676 m)     Head Circumference --      Peak Flow --      Pain Score 09/19/19 1051 8     Pain Loc --      Pain Edu? --      Excl. in [place]? --     Vital signs reviewed, nursing assessments reviewed.   Constitutional:   Alert and oriented. Non-toxic appearance. Eyes:   Conjunctivae are normal. EOMI. PERRL. ENT      Head:   Normocephalic and atraumatic.      Nose:   Wearing a mask.      Mouth/Throat:   Wearing a mask.  Neck:   No meningismus. Full ROM. Hematological/Lymphatic/Immunilogical:   No cervical lymphadenopathy. Cardiovascular:   RRR. Symmetric bilateral radial and DP pulses.  No murmurs. Cap refill less than 2 seconds. Respiratory:    Normal respiratory effort without tachypnea/retractions. Breath sounds are clear and equal bilaterally. No wheezes/rales/rhonchi. Gastrointestinal:   Soft and nontender. Non distended. There is no CVA tenderness.  No rebound, rigidity, or guarding. Genitourinary:   deferred Musculoskeletal:   Pain limited range of motion of left shoulder, but no dislocation.  There is some tenderness over the scapula, no focal bony tenderness crepitus or deformity.  Clavicles are stable.  No midline spinal tenderness.  There is some mild tenderness in the paraspinous soft tissue on the right lumbosacral region.  There is 1+ pitting edema bilateral lower extremities, symmetric calf circumference. . Neurologic:   Normal speech and language.  Motor grossly intact. No acute focal neurologic deficits are appreciated.  Skin:    Skin is warm, dry and intact. No rash noted.  No petechiae, purpura, or bullae.  ____________________________________________    LABS (pertinent positives/negatives) (all labs ordered are listed, but only abnormal results are displayed) Labs Reviewed  CBC - Abnormal; Notable for the following components:      Result Value   RBC 3.74 (*)    Hemoglobin 11.7 (*)    HCT 34.7 (*)    All other components within normal limits  COMPREHENSIVE METABOLIC PANEL - Abnormal; Notable for the following components:   Sodium 134 (*)    Chloride 92 (*)    BUN 30 (*)    Alkaline Phosphatase 297 (*)    All other components within normal limits  BRAIN NATRIURETIC PEPTIDE - Abnormal; Notable for the following components:   B Natriuretic Peptide 344.3 (*)    All other components within normal limits   ____________________________________________   EKG  Interpreted by me Sinus rhythm rate of 73, right axis, normal intervals.  Poor R wave progression.  Normal ST segments and T waves.  ____________________________________________    RADIOLOGY  DG Lumbar Spine 2-3 Views  Result Date:  09/19/2019 CLINICAL DATA:  Lower back pain. EXAM: LUMBAR SPINE - 2-3 VIEW COMPARISON:  February 10, 2019 FINDINGS: There is no evidence of acute lumbar spine fracture. Evidence of prior vertebroplasty is seen at the levels of T12 through L4. There is stable moderate to marked severity dextroscoliosis. Mild multilevel intervertebral disc space narrowing is seen. Radiopaque surgical clips are seen within the medial aspect of the mid right abdomen. IMPRESSION: Stable moderate to marked severity dextroscoliosis with evidence of prior vertebroplasty at the levels of T12 through L4. Electronically Signed   By: Virgina Norfolk M.D.   On: 09/19/2019 17:26    ____________________________________________   PROCEDURES Procedures  ____________________________________________  DIFFERENTIAL DIAGNOSIS   Lumbar fracture, scapular fracture, musculoskeletal strain, dehydration, electrolyte abnormality secondary to diuretic use  CLINICAL IMPRESSION / ASSESSMENT AND PLAN / ED COURSE  Medications ordered in the ED: Medications - No data to display  Pertinent labs & imaging results that were available during my care of the patient were reviewed by me and considered in my medical decision making (see chart for details).  Katie Harding was evaluated in Emergency Department on 09/19/2019 for the symptoms described in the history of present illness. She was evaluated in the context of the global COVID-19 pandemic, which necessitated consideration that the patient might be at risk for infection with the SARS-CoV-2 virus that causes COVID-19. Institutional protocols and algorithms that pertain  to the evaluation of patients at risk for COVID-19 are in a state of rapid change based on information released by regulatory bodies including the CDC and federal and state organizations. These policies and algorithms were followed during the patient's care in the ED.   Patient presents with acute musculoskeletal complaints  after mechanical fall.  X-ray of the lumbar spine is unremarkable and exam is consistent with muscular strain.  We will also obtain an x-ray of the shoulder to evaluate for acute fracture.  She can continue judicious use of hydrocodone at home in addition to Tylenol, and I will add on a very low-dose diazepam to help with muscle spasm and sleep.  Labs are reassuring, potassium and sodium are roughly normal.  Patient is successfully maintaining her electrolytes with sodium and potassium supplements.  Will add on half dose Lasix in addition to her current diuretic regimen, continue follow-up with PCP and heart failure clinic.      ____________________________________________   FINAL CLINICAL IMPRESSION(S) / ED DIAGNOSES    Final diagnoses:  Strain of lumbar region, initial encounter  Acute pain of left shoulder     ED Discharge Orders         Ordered    furosemide (LASIX) 40 MG tablet  Daily,   Status:  Discontinued        09/19/19 2311    diazepam (VALIUM) 2 MG tablet  Every 8 hours PRN        09/19/19 2312    furosemide (LASIX) 40 MG tablet  Daily        09/19/19 2312          Portions of this note were generated with dragon dictation software. Dictation errors may occur despite best attempts at proofreading.   Carrie Mew, MD 09/19/19 [phone]

## 2019-09-19 NOTE — ED Triage Notes (Signed)
Pt states that she sleeps in a recliner at night and when she woke up this am she could barely walk due to the pain in her back, states that she has had surgeries on her back in the past and this reminds her of that, pt also states that her legs are swelling and that they have changed her medication recently causing her to gain 2 lb of fluid, pt points to her low back for the location of her pain

## 2019-09-20 ENCOUNTER — Encounter: Payer: Self-pay | Admitting: Internal Medicine

## 2019-09-20 ENCOUNTER — Emergency Department: Payer: Medicare PPO

## 2019-09-20 DIAGNOSIS — R296 Repeated falls: Secondary | ICD-10-CM

## 2019-09-20 DIAGNOSIS — R54 Age-related physical debility: Secondary | ICD-10-CM

## 2019-09-20 DIAGNOSIS — I5032 Chronic diastolic (congestive) heart failure: Secondary | ICD-10-CM

## 2019-09-20 DIAGNOSIS — W19XXXA Unspecified fall, initial encounter: Secondary | ICD-10-CM

## 2019-09-20 DIAGNOSIS — W19XXXD Unspecified fall, subsequent encounter: Secondary | ICD-10-CM

## 2019-09-20 DIAGNOSIS — M25512 Pain in left shoulder: Secondary | ICD-10-CM | POA: Diagnosis not present

## 2019-09-20 DIAGNOSIS — G8911 Acute pain due to trauma: Secondary | ICD-10-CM

## 2019-09-20 DIAGNOSIS — T07XXXA Unspecified multiple injuries, initial encounter: Secondary | ICD-10-CM

## 2019-09-20 LAB — CREATININE, SERUM
Creatinine, Ser: 0.8 mg/dL (ref 0.44–1.00)
GFR calc Af Amer: >60 mL/min (ref >60)
GFR calc non Af Amer: >60 mL/min (ref >60)

## 2019-09-20 LAB — CBC
HCT: 37.1 % (ref 36.0–46.0)
Hemoglobin: 12.5 g/dL (ref 12.0–15.0)
MCH: 31.4 pg (ref 26.0–34.0)
MCHC: 33.7 g/dL (ref 30.0–36.0)
MCV: 93.2 fL (ref 80.0–100.0)
Platelets: 291 10*3/uL (ref 150–400)
RBC: 3.98 MIL/uL (ref 3.87–5.11)
RDW: 13.9 % (ref 11.5–15.5)
WBC: 7.1 10*3/uL (ref 4.0–10.5)
nRBC: 0 % (ref 0.0–0.2)

## 2019-09-20 MED ORDER — SUCRALFATE 1 G PO TABS
1.0000 g | ORAL_TABLET | Freq: Three times a day (TID) | ORAL | Status: DC
  Administered 2019-09-20 – 2019-09-22 (×5): 1 g via ORAL
  Filled 2019-09-20 (×5): qty 1

## 2019-09-20 MED ORDER — HYDROCODONE-ACETAMINOPHEN 5-325 MG PO TABS
1.0000 | ORAL_TABLET | ORAL | Status: DC | PRN
  Administered 2019-09-20 – 2019-09-21 (×6): 2 via ORAL
  Administered 2019-09-22: 1 via ORAL
  Filled 2019-09-20 (×4): qty 2
  Filled 2019-09-20: qty 1
  Filled 2019-09-20 (×2): qty 2

## 2019-09-20 MED ORDER — ALUM & MAG HYDROXIDE-SIMETH 200-200-20 MG/5ML PO SUSP
30.0000 mL | Freq: Four times a day (QID) | ORAL | Status: DC | PRN
  Administered 2019-09-20: 30 mL via ORAL
  Filled 2019-09-20: qty 30

## 2019-09-20 MED ORDER — VITAMIN D 25 MCG (1000 UNIT) PO TABS
2000.0000 [IU] | ORAL_TABLET | Freq: Every day | ORAL | Status: DC
  Administered 2019-09-20 – 2019-09-22 (×3): 2000 [IU] via ORAL
  Filled 2019-09-20 (×3): qty 2

## 2019-09-20 MED ORDER — CALCIUM CITRATE-VITAMIN D 500-500 MG-UNIT PO CHEW
1.0000 | CHEWABLE_TABLET | Freq: Two times a day (BID) | ORAL | Status: DC
  Administered 2019-09-20 – 2019-09-22 (×4): 1 via ORAL
  Filled 2019-09-20 (×5): qty 1

## 2019-09-20 MED ORDER — ALPRAZOLAM 0.25 MG PO TABS
0.1250 mg | ORAL_TABLET | Freq: Three times a day (TID) | ORAL | Status: DC | PRN
  Administered 2019-09-20: 0.125 mg via ORAL
  Filled 2019-09-20: qty 1

## 2019-09-20 MED ORDER — BISACODYL 5 MG PO TBEC: 5.0000 mg | DELAYED_RELEASE_TABLET | Freq: Every day | ORAL | Status: DC | PRN

## 2019-09-20 MED ORDER — ONDANSETRON HCL 4 MG/2ML IJ SOLN: 4.0000 mg | Freq: Four times a day (QID) | INTRAMUSCULAR | Status: DC | PRN

## 2019-09-20 MED ORDER — ENOXAPARIN SODIUM 40 MG/0.4ML ~~LOC~~ SOLN
40.0000 mg | SUBCUTANEOUS | Status: DC
  Administered 2019-09-20 – 2019-09-22 (×3): 40 mg via SUBCUTANEOUS
  Filled 2019-09-20 (×4): qty 0.4

## 2019-09-20 MED ORDER — ATORVASTATIN CALCIUM 20 MG PO TABS
40.0000 mg | ORAL_TABLET | Freq: Every day | ORAL | Status: DC
  Administered 2019-09-20 – 2019-09-22 (×3): 40 mg via ORAL
  Filled 2019-09-20 (×3): qty 2

## 2019-09-20 MED ORDER — MAGNESIUM OXIDE 400 MG PO TABS
400.0000 mg | ORAL_TABLET | Freq: Two times a day (BID) | ORAL | Status: DC
  Administered 2019-09-20 – 2019-09-22 (×4): 400 mg via ORAL
  Filled 2019-09-20 (×5): qty 1

## 2019-09-20 MED ORDER — POLYETHYLENE GLYCOL 3350 17 G PO PACK
17.0000 g | PACK | Freq: Every day | ORAL | Status: DC
  Administered 2019-09-20 – 2019-09-22 (×3): 17 g via ORAL
  Filled 2019-09-20 (×3): qty 1

## 2019-09-20 MED ORDER — FUROSEMIDE 20 MG PO TABS
20.0000 mg | ORAL_TABLET | Freq: Every day | ORAL | Status: DC
  Administered 2019-09-21: 20 mg via ORAL
  Filled 2019-09-20: qty 1

## 2019-09-20 MED ORDER — PROPRANOLOL HCL 20 MG PO TABS
40.0000 mg | ORAL_TABLET | Freq: Three times a day (TID) | ORAL | Status: DC
  Administered 2019-09-20 – 2019-09-22 (×5): 40 mg via ORAL
  Filled 2019-09-20 (×6): qty 2

## 2019-09-20 MED ORDER — ONDANSETRON HCL 4 MG PO TABS: 4.0000 mg | ORAL_TABLET | Freq: Four times a day (QID) | ORAL | Status: DC | PRN

## 2019-09-20 MED ORDER — ACETAMINOPHEN 325 MG PO TABS
650.0000 mg | ORAL_TABLET | Freq: Four times a day (QID) | ORAL | Status: DC | PRN
  Administered 2019-09-21: 650 mg via ORAL
  Filled 2019-09-20: qty 2

## 2019-09-20 MED ORDER — DILTIAZEM HCL ER COATED BEADS 120 MG PO CP24
120.0000 mg | ORAL_CAPSULE | Freq: Every day | ORAL | Status: DC
  Administered 2019-09-21 – 2019-09-22 (×2): 120 mg via ORAL
  Filled 2019-09-20 (×3): qty 1

## 2019-09-20 MED ORDER — MORPHINE SULFATE (PF) 2 MG/ML IV SOLN: 2.0000 mg | INTRAVENOUS | Status: DC | PRN

## 2019-09-20 MED ORDER — ASPIRIN EC 81 MG PO TBEC
81.0000 mg | DELAYED_RELEASE_TABLET | Freq: Every day | ORAL | Status: DC
  Administered 2019-09-20 – 2019-09-22 (×3): 81 mg via ORAL
  Filled 2019-09-20 (×3): qty 1

## 2019-09-20 MED ORDER — METOCLOPRAMIDE HCL 10 MG PO TABS
5.0000 mg | ORAL_TABLET | Freq: Three times a day (TID) | ORAL | Status: DC
  Administered 2019-09-20 – 2019-09-22 (×5): 5 mg via ORAL
  Filled 2019-09-20 (×5): qty 1

## 2019-09-20 MED ORDER — PANTOPRAZOLE SODIUM 40 MG PO TBEC
40.0000 mg | DELAYED_RELEASE_TABLET | Freq: Every day | ORAL | Status: DC
  Administered 2019-09-20 – 2019-09-22 (×3): 40 mg via ORAL
  Filled 2019-09-20 (×3): qty 1

## 2019-09-20 MED ORDER — ACETAMINOPHEN 650 MG RE SUPP: 650.0000 mg | Freq: Four times a day (QID) | RECTAL | Status: DC | PRN

## 2019-09-20 NOTE — Evaluation (Addendum)
Occupational Therapy Evaluation Patient Details Name: Katie Harding MRN: [id number] DOB: [birth year] Today's Date: 09/20/2019    History of Present Illness Katie Harding is a 79 y.o. female with medical history significant for osteoporosis with history of frequent falls, compression fractures status post multiple kyphoplasties with chronic low back pain and multiple rib fractures in June 2021 from a fall who presents to the emergency room with low back pain and left shoulder pain following a mechanical fall at home after she tripped over the edge of a couch. Chest x-ray showed subacute fractures of the left sixth and seventh ribs posterolaterally.  CT chest showed more chronic appearance of the rib fracture.   Clinical Impression   Pt was seen for OT evaluation this date. Prior to hospital admission, pt was Indep with self care ADLs and MOD I with ADL mobility. Pt lives in single story apartment in [place] [facility] with her spouse with one threshold step to enter. Currently pt demonstrates impairments as described below (See OT problem list) which functionally limit her ability to perform ADL/self-care tasks. Pt currently requires MIN A with ADL transfers and fxl mobility with RW and MOD A with LB dressing 2/2 pain in lower back and L UE (9/10 reported).  Pt would benefit from skilled OT to address noted impairments and functional limitations (see below for any additional details) in order to maximize safety and independence while minimizing falls risk and caregiver burden. Upon hospital discharge, recommend STR to maximize pt safety and return to PLOF.     Follow Up Recommendations  SNF;Supervision - Intermittent    Equipment Recommendations  3 in 1 bedside commode    Recommendations for Other Services       Precautions / Restrictions Precautions Precautions: Fall Restrictions Weight Bearing Restrictions: No Other Position/Activity Restrictions: difficulty raising L UE/pushing off  L UE      Mobility Bed Mobility Overal bed mobility: Needs Assistance Bed Mobility: Supine to Sit     Supine to sit: Min assist     General bed mobility comments: and MIN A to scoot FWD to square hips.  Transfers Overall transfer level: Needs assistance Equipment used: Rolling walker (2 wheeled) Transfers: Sit to/from Stand Sit to Stand: Min assist              Balance Overall balance assessment: Needs assistance Sitting-balance support: Feet supported Sitting balance-Leahy Scale: Good       Standing balance-Leahy Scale: Poor Standing balance comment: requries external support (MIN A) and RW with B UE support.                           ADL either performed or assessed with clinical judgement   ADL Overall ADL's : Needs assistance/impaired Eating/Feeding: Independent;Sitting   Grooming: Set up;Sitting       Lower Body Bathing: Minimal assistance;Sit to/from stand Lower Body Bathing Details (indicate cue type and reason): from [facility] with RW to perform anterior/posterior LB bathing/drying Upper Body Dressing : Set up;Sitting   Lower Body Dressing: Moderate assistance;Sit to/from stand Lower Body Dressing Details (indicate cue type and reason): MOD to thread socks/brief d/t limited tolerance for bending at the waist Toilet Transfer: Minimal assistance;RW;BSC Toilet Transfer Details (indicate cue type and reason): takes 3-4 shuffling side steps/pivoting steps to her R to [facility] and L back to bed with RW. Toileting- Clothing Manipulation and Hygiene: Minimal assistance;Sit to/from stand Toileting - Clothing Manipulation Details (indicate cue type and  reason): MIN A for balance for standing peri care from [facility]     Functional mobility during ADLs: Minimal assistance;Rolling walker (to take 2-3 steps FWD/BKWD, pt does not feel she can complete more at this time d/t pain/decreased confidence with balance)       Vision Baseline Vision/History: Wears  glasses Wears Glasses: At all times Patient Visual Report: No change from baseline       Perception     Praxis      Pertinent Vitals/Pain Pain Assessment: 0-10 Pain Score: 9  Pain Location: lower back Pain Descriptors / Indicators: Aching;Sore Pain Intervention(s): Limited activity within patient's tolerance;Monitored during session;Premedicated before session     Hand Dominance Right   Extremity/Trunk Assessment Upper Extremity Assessment Upper Extremity Assessment: RUE deficits/detail;LUE deficits/detail RUE Deficits / Details: ROM WFL, MMT grossly 4-/5 LUE Deficits / Details: Shld flexion to <1/4 arc of motion d/t pain, elbox flexion WFL and digit/wrist ROM WFL, but MMT not assessed as all mobilization of joints of L UE pt reports pain.   Lower Extremity Assessment Lower Extremity Assessment: Defer to PT evaluation;Generalized weakness       Communication Communication Communication: No difficulties   Cognition Arousal/Alertness: Awake/alert Behavior During Therapy: WFL for tasks assessed/performed Overall Cognitive Status: Within Functional Limits for tasks assessed                                     General Comments       Exercises Other Exercises Other Exercises: OT facilitates ed re: role of OT, safety/fall prevention considerations. Pt with good understanding, would benefit from LB ADL AE education for modification d/t difficulty reaching.   Shoulder Instructions      Home Living Family/patient expects to be discharged to:: Other (Comment) (Independent living senior apartments) Living Arrangements: Spouse/significant other Available Help at Discharge: Family;Available PRN/intermittently (niece-Rhonda) Type of Home: Independent living facility Home Access: Stairs to enter Entrance Stairs-Number of Steps: 1 threshold/step   Home Layout: One level     Bathroom Shower/Tub: Occupational psychologist:  (comfort height)     Home  Equipment: Environmental consultant - 2 wheels;Walker - 4 wheels;Grab bars - tub/shower;Shower seat;Toilet riser          Prior Functioning/Environment Level of Independence: Independent with assistive device(s)        Comments: Pt performs fxl mobility with rollator at baseline, performs all self care I'ly, but endorses recent difficulty with LB dressing which is only worsened since fall. Pt reports she has a neice that drives them to doctor's appts and gets groceries (pt has not driven since November. Pt's spouse has dementia and has [place] caregiver several hours per week to help with bathing/dressing.        OT Problem List: Decreased strength;Decreased range of motion;Decreased activity tolerance;Impaired balance (sitting and/or standing);Decreased knowledge of use of DME or AE;Impaired UE functional use;Pain      OT Treatment/Interventions: Self-care/ADL training;Therapeutic exercise;DME and/or AE instruction;Therapeutic activities;Balance training;Patient/family education    OT Goals(Current goals can be found in the care plan section) Acute Rehab OT Goals Patient Stated Goal: to be more confident with getting up/moving and decrease pain OT Goal Formulation: With patient Time For Goal Achievement: 10/04/19 Potential to Achieve Goals: Good ADL Goals Pt Will Perform Lower Body Dressing: with supervision;with adaptive equipment;sit to/from stand Pt Will Transfer to Toilet: with supervision;stand pivot transfer;bedside commode Pt Will Perform Toileting - Clothing  Manipulation and hygiene: with supervision;sit to/from stand  OT Frequency: Min 1X/week   Barriers to D/C: Decreased caregiver support          Co-evaluation              AM-PAC OT "6 Clicks" Daily Activity     Outcome Measure Help from another person eating meals?: None Help from another person taking care of personal grooming?: A Little Help from another person toileting, which includes using toliet, bedpan, or urinal?: A  Little Help from another person bathing (including washing, rinsing, drying)?: A Little Help from another person to put on and taking off regular upper body clothing?: A Little Help from another person to put on and taking off regular lower body clothing?: A Lot 6 Click Score: 18   End of Session Equipment Utilized During Treatment: Rolling walker Nurse Communication: Mobility status  Activity Tolerance: Patient tolerated treatment well;Patient limited by pain Patient left: in chair;with call bell/phone within reach;with chair alarm set  OT Visit Diagnosis: Unsteadiness on feet (R26.81);Other abnormalities of gait and mobility (R26.89);Muscle weakness (generalized) (M62.81)                Time: 8316-7425 OT Time Calculation (min): 54 min Charges:  OT General Charges $OT Visit: 1 Visit OT Evaluation $OT Eval Moderate Complexity: 1 Mod OT Treatments $Self Care/Home Management : 23-37 mins $Therapeutic Activity: 8-22 mins  Gerrianne Scale, MS, OTR/L ascom [phone] 09/20/19, 2:55 PM

## 2019-09-20 NOTE — Progress Notes (Signed)
Physical Therapy Evaluation Patient Details Name: Katie Harding MRN: [id number] DOB: January 18, 1941 Today's Date: 09/20/2019   History of Present Illness  Per MD Note: Katie Harding is a 79 y.o. female with medical history significant for osteoporosis with history of frequent falls, compression fractures status post multiple kyphoplasties with chronic low back pain and multiple rib fractures in June 2021 from a fall who presents to the emergency room with low back pain and left shoulder pain following a mechanical fall at home after she tripped over the edge of a couch.  She tried her home oxycodone without relief.  She denies lower extremity numbness tingling, saddle anesthesia or bowel or bladder retention or incontinence.  Clinical Impression  Patient agrees to PT evaluation. She has 7/10 back pain. She has BLE strength -3/5 hip flex and abd, 3/5 knee extension. She needs min assist for supine <> sit. She is able to stand with RW and is able to side step to the left with Rw x 4 steps. Patient is not able to ambulate due to pain level and fatigue. She has good sitting balance without UE support. She needs UE support with RW. Patient will continue to benefit from skilled PT to improve mobility and strength.     Follow Up Recommendations Home health PT    Equipment Recommendations  Rolling walker with 5" wheels    Recommendations for Other Services       Precautions / Restrictions Precautions Precautions: None Restrictions Weight Bearing Restrictions: No Other Position/Activity Restrictions: difficulty raising L UE/pushing off L UE      Mobility  Bed Mobility Overal bed mobility: Needs Assistance Bed Mobility: Supine to Sit;Sit to Supine     Supine to sit: Min assist Sit to supine: Min assist   General bed mobility comments: VC for safety  Transfers Overall transfer level: Needs assistance Equipment used: Rolling walker (2 wheeled) Transfers: Sit to/from Stand Sit to Stand:  Min assist         General transfer comment: cues for safety and posture  Ambulation/Gait Ambulation/Gait assistance:  (unable)              Stairs            Wheelchair Mobility    Modified Rankin (Stroke Patients Only)       Balance Overall balance assessment: Needs assistance Sitting-balance support: Feet supported Sitting balance-Leahy Scale: Good       Standing balance-Leahy Scale: Poor Standing balance comment: needs UE support                             Pertinent Vitals/Pain Pain Assessment: Faces Pain Score: 9  Faces Pain Scale: Hurts little more Pain Location: lower back Pain Descriptors / Indicators: Aching;Sore Pain Intervention(s): Limited activity within patient's tolerance;Monitored during session    Home Living Family/patient expects to be discharged to:: Assisted living ([facility]) Living Arrangements: Spouse/significant other Available Help at Discharge: Family Type of Home: Independent living facility Home Access: Stairs to enter   Entrance Stairs-Number of Steps: 1 threshold/step Home Layout: One level Home Equipment: Walker - 2 wheels      Prior Function Level of Independence: Independent with assistive device(s)         Comments: Pt performs fxl mobility with rollator at baseline, performs all self care I'ly, but endorses recent difficulty with LB dressing which is only worsened since fall. Pt reports she has a neice that drives them  to doctor's appts and gets groceries (pt has not driven since November. Pt's spouse has dementia and has [place] caregiver several hours per week to help with bathing/dressing.     Hand Dominance   Dominant Hand: Right    Extremity/Trunk Assessment   Upper Extremity Assessment Upper Extremity Assessment: Overall WFL for tasks assessed RUE Deficits / Details: ROM WFL, MMT grossly 4-/5 LUE Deficits / Details: Shld flexion to <1/4 arc of motion d/t pain, elbox flexion WFL and  digit/wrist ROM WFL, but MMT not assessed as all mobilization of joints of L UE pt reports pain.    Lower Extremity Assessment Lower Extremity Assessment: Overall WFL for tasks assessed       Communication   Communication: No difficulties  Cognition Arousal/Alertness: Awake/alert Behavior During Therapy: WFL for tasks assessed/performed Overall Cognitive Status: Within Functional Limits for tasks assessed                                        General Comments      Exercises Other Exercises Other Exercises: OT facilitates ed re: role of OT, safety/fall prevention considerations. Pt with good understanding, would benefit from LB ADL AE education for modification d/t difficulty reaching.   Assessment/Plan    PT Assessment Patient needs continued PT services  PT Problem List Decreased strength;Decreased activity tolerance;Decreased balance;Decreased mobility       PT Treatment Interventions Gait training;Therapeutic activities;Therapeutic exercise;Balance training    PT Goals (Current goals can be found in the Care Plan section)  Acute Rehab PT Goals Patient Stated Goal: to ambualte PT Goal Formulation: With patient Time For Goal Achievement: 10/04/19 Potential to Achieve Goals: Fair    Frequency 7X/week   Barriers to discharge        Co-evaluation               AM-PAC PT "6 Clicks" Mobility  Outcome Measure Help needed turning from your back to your side while in a flat bed without using bedrails?: A Little Help needed moving from lying on your back to sitting on the side of a flat bed without using bedrails?: A Little Help needed moving to and from a bed to a chair (including a wheelchair)?: A Little Help needed standing up from a chair using your arms (e.g., wheelchair or bedside chair)?: A Little Help needed to walk in hospital room?: A Lot Help needed climbing 3-5 steps with a railing? : A Lot 6 Click Score: 16    End of Session  Equipment Utilized During Treatment: Gait belt Activity Tolerance: Patient tolerated treatment well Patient left: in bed;with bed alarm set Nurse Communication: Mobility status PT Visit Diagnosis: Unsteadiness on feet (R26.81);Muscle weakness (generalized) (M62.81);History of falling (Z91.81);Difficulty in walking, not elsewhere classified (R26.2)    Time: 1610-9604 PT Time Calculation (min) (ACUTE ONLY): 15 min   Charges:   PT Evaluation $PT Eval Low Complexity: 1 Low PT Treatments $Therapeutic Activity: 8-22 mins          Alanson Puls, PT DPT 09/20/2019, 3:27 PM

## 2019-09-20 NOTE — Progress Notes (Signed)
Pt has been stable, take meds whole with trouble swallowing, on low bed and with floor mats. Alert and oriented. Worked with OT and was able to stay in the chair for 2 hours, used BSC with 1 assist. Home meds restarted.

## 2019-09-20 NOTE — H&P (Signed)
History and Physical    Katie Harding YBF:[id number] DOB: 1941-01-18 DOA: 09/19/2019  PCP: Rusty Aus, MD   Patient coming from: Home  I have personally briefly reviewed patient's old medical records in [place]  Chief Complaint: Fall, left shoulder pain  HPI: Katie Harding is a 79 y.o. female with medical history significant for osteoporosis with history of frequent falls, compression fractures status post multiple kyphoplasties with chronic low back pain and multiple rib fractures in June 2021 from a fall who presents to the emergency room with low back pain and left shoulder pain following a mechanical fall at home after she tripped over the edge of a couch.  She tried her home oxycodone without relief.  She denies lower extremity numbness tingling, saddle anesthesia or bowel or bladder retention or incontinence.  ED Course: Upon arrival, vitals were within normal limits.  Blood work mostly unremarkable.  BNP 344.  Chest x-ray showed subacute fractures of the left sixth and seventh ribs posterolaterally.  CT chest showed more chronic appearance of the rib fracture.  Hospitalist consulted for admission for pain control and for safe discharge option in view of multiple falls.  Review of Systems: As per HPI otherwise all other systems on review of systems negative.    Past Medical History:  Diagnosis Date  . #[id number] 2006  . Anxiety   . Arrhythmia    atrial fibrillation  . Arthritis    osteoporosis  . Breast cancer [facility]) 2006   right breast lumpectomy with radiation. no lymph nodes involved  . CHF (congestive heart failure) ([place])   . Colon polyp    adenoma  . Complication of anesthesia   . Constipation 04/2019   on a new bowel regime  . Dyspnea   . Dysrhythmia    ATRIAL FIBRILLATION  . Fracture of vertebra due to osteoporosis ([facility])    history of L1, L2 and now T12  . GERD (gastroesophageal reflux disease) 04/04/2015  . Heart disease   . History of gastric ulcer  04/2001  . History of right breast cancer    post surgery/ XRT  . MVP (mitral valve prolapse)    dental prophylaxis  . Osteoporosis   . Osteoporosis, post-menopausal   . Personal history of radiation therapy 2006   right breast ca  . PONV (postoperative nausea and vomiting)    NAUSEA ON SEVERAL OCCASIONS  . Scoliosis   . Surgical menopause     Past Surgical History:  Procedure Laterality Date  . BREAST BIOPSY Right 2006   breast ca  . BREAST LUMPECTOMY Right 2006   breast ca  . CATARACT EXTRACTION    . CHOLECYSTECTOMY    . 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 COLONOSCOPY  11/29/2004   PH Adenomatous Polyps, [facility] (Brother)  . COLONOSCOPY  03/14/2010   PH Adenomatous Polyps, [facility] (Brother); CBF 03/2015, Ltr mailed 01/19/2015 (dw)  . COLONOSCOPY  05/05/2015   PH Adenomatous Polyps, [facility] (Brother); No repeat due to age per RTE (dw)  . COLONOSCOPY WITH PROPOFOL N/A 05/05/2015   Procedure: COLONOSCOPY WITH PROPOFOL;  Surgeon: Manya Silvas, MD;  Location[facility] ENDOSCOPY;  Service: Endoscopy;  Laterality: N/A;  . ESOPHAGOGASTRODUODENOSCOPY  12/24/2006   01/06/2004, 08/27/2001, 06/18/2001  . ESOPHAGOGASTRODUODENOSCOPY (EGD) WITH PROPOFOL N/A 05/05/2015   Procedure: ESOPHAGOGASTRODUODENOSCOPY (EGD) WITH PROPOFOL;  Surgeon: Manya Silvas, MD;  Location: [facility] ENDOSCOPY;  Service: Endoscopy;  Laterality: N/A;  . EYE SURGERY Bilateral  cataracts removed  . KYPHOPLASTY N/A 06/08/2019   Procedure: T12 KYPHOPLASTY;  Surgeon: Hessie Knows, MD;  Location: [facility] ORS;  Service: Orthopedics;  Laterality: N/A;  . KYPHOPLASTY N/A 06/30/2019   Procedure: T 11 KYPHOPLASTY;  Surgeon: Hessie Knows, MD;  Location: [facility] ORS;  Service: Orthopedics;  Laterality: N/A;  . NASAL SEPTUM SURGERY    . NASAL SEPTUM SURGERY    . ovarian cyst removed    . ROTATOR CUFF REPAIR Right   . TONSILLECTOMY    . TONSILLECTOMY    . TOTAL ABDOMINAL HYSTERECTOMY W/ BILATERAL  SALPINGOOPHORECTOMY  1980s   for endometriosis in 1980s     reports that she has never smoked. She has never used smokeless tobacco. She reports that she does not drink alcohol and does not use drugs.  Allergies  Allergen Reactions  . Nsaids Other (See Comments)    Causes gastric burning, pain. History of gastric ulcer.  . Hydrochlorothiazide Other (See Comments)    CAUSED A SEVERE DROP IN POTASSIUM LEVELS REQUIRING HOSPITALIZATION    Family History  Problem Relation Age of Onset  . Breast cancer Sister 58  . Multiple myeloma Sister   . Breast cancer Maternal Aunt 60  . Breast cancer Maternal Aunt 60  . Colon cancer Brother   . Intracerebral hemorrhage Mother   . CVA Mother   . Hypertension Mother   . Stroke Mother   . Basal cell carcinoma Mother   . Melanoma Neg Hx       Prior to Admission medications   Medication Sig Start Date End Date Taking? Authorizing Provider  acetaminophen (TYLENOL) 325 MG tablet Take 650 mg by mouth every 4 (four) hours as needed. For pain / increased temp Max dose for 24 hrs is 3000 mg from all sources of Apap/tyenol   Yes [provider]  ALPRAZolam (XANAX) 0.25 MG tablet Take 0.5 tablets (0.125 mg total) by mouth 4 (four) times daily. 08/24/16  Yes Toni Arthurs, NP  aspirin EC 81 MG EC tablet Take 1 tablet (81 mg total) by mouth daily. Swallow whole. 07/11/19  Yes Lorella Nimrod, MD  atorvastatin (LIPITOR) 40 MG tablet Take 1 tablet (40 mg total) by mouth daily. 07/11/19  Yes Lorella Nimrod, MD  bisacodyl (DULCOLAX) 5 MG EC tablet Take 5 mg by mouth daily as needed for moderate constipation.   Yes [provider]  diltiazem (CARDIZEM CD) 120 MG 24 hr capsule Take 120 mg by mouth daily.   Yes [provider]  furosemide (LASIX) 40 MG tablet Take 0.5 tablets (20 mg total) by mouth daily for 20 days. 09/19/19 10/09/19 Yes Carrie Mew, MD  HYDROcodone-acetaminophen (NORCO/VICODIN) 5-325 MG tablet Take 1 tablet by mouth every  6 (six) hours as needed for moderate pain.   Yes [provider]  metoCLOPramide (REGLAN) 5 MG tablet Take 5 mg by mouth 3 (three) times daily. 06/01/19 09/20/19 Yes [provider]  metolazone (ZAROXOLYN) 2.5 MG tablet Take 2.5 mg by mouth every other day. 09/18/19  Yes [provider]  omeprazole (PRILOSEC) 40 MG capsule Take 40 mg by mouth 2 (two) times daily.   Yes [provider]  potassium chloride SA (KLOR-CON) 20 MEQ tablet Take 20 mEq by mouth once.   Yes [provider]  propranolol (INDERAL) 40 MG tablet Take 40 mg by mouth 3 (three) times daily.   Yes [provider]  sucralfate (CARAFATE) 1 g tablet Take 1 g by mouth 3 (three) times daily. 04/16/19  Yes [provider]  TYMLOS 3120 MCG/1.56ML SOPN Inject into the skin. 09/10/19  Yes [provider]  Amino Acids-Protein Hydrolys (FEEDING SUPPLEMENT, PRO-STAT SUGAR FREE 64,) LIQD Take 30 mLs by mouth 2 (two) times daily between meals.    [provider]  calcium citrate-vitamin D (CITRACAL+D) 315-200 MG-UNIT tablet Take 2 tablets by mouth 2 (two) times daily.     [provider]  Cholecalciferol 1000 units capsule Take 2,000 Units by mouth daily. 2 caps    [provider]  co-enzyme Q-10 30 MG capsule Take 30 mg by mouth daily.    [provider]  diazepam (VALIUM) 2 MG tablet Take 1 tablet (2 mg total) by mouth every 8 (eight) hours as needed for up to 5 days for muscle spasms. 09/19/19 09/24/19  Carrie Mew, MD  indapamide (LOZOL) 1.25 MG tablet Take 1.25 mg by mouth daily as needed. Patient not taking: Reported on 09/20/2019 06/18/19   [provider]  magnesium oxide (MAG-OX) 400 MG tablet Take 400 mg by mouth 2 (two) times daily.    [provider]  Multiple Vitamin (MULTIVITAMIN) tablet Take 1 tablet by mouth daily.    [provider]  polyethylene glycol (MIRALAX / GLYCOLAX) packet Take 17 g by mouth  daily.     [provider]    Physical Exam: Vitals:   09/19/19 1029 09/19/19 1051 09/19/19 1758 09/19/19 2041  BP: (!) 154/94  (!) 156/86 134/77  Pulse: 68  71 85  Resp: 18  19 16   Temp: (!) 97.5 F (36.4 C)  98.1 F (36.7 C) 98.7 F (37.1 C)  TempSrc: Oral  Oral Oral  SpO2: 98%  97% 97%  Weight:  49.4 kg    Height:  5' 6"  (1.676 m)       Vitals:   09/19/19 1029 09/19/19 1051 09/19/19 1758 09/19/19 2041  BP: (!) 154/94  (!) 156/86 134/77  Pulse: 68  71 85  Resp: 18  19 16   Temp: (!) 97.5 F (36.4 C)  98.1 F (36.7 C) 98.7 F (37.1 C)  TempSrc: Oral  Oral Oral  SpO2: 98%  97% 97%  Weight:  49.4 kg    Height:  5' 6"  (1.676 m)        Constitutional: Alert and oriented x 3 .  In some pain discomfort HEENT:      Head: Normocephalic and atraumatic.         Eyes: PERLA, EOMI, Conjunctivae are normal. Sclera is non-icteric.       Mouth/Throat: Mucous membranes are moist.       Neck: Supple with no signs of meningismus. Cardiovascular: Regular rate and rhythm. No murmurs, gallops, or rubs. 2+ symmetrical distal pulses are present . No JVD. No 2+ LE edema Respiratory: Respiratory effort normal .Lungs sounds clear bilaterally. No wheezes, crackles, or rhonchi.  Gastrointestinal: Soft, non tender, and non distended with positive bowel sounds. No rebound or guarding. Genitourinary: No CVA tenderness. Musculoskeletal:  Range of motion left shoulder limited by pain.   Neurologic: Normal speech and language. Face is symmetric. Moving all extremities. No gross focal neurologic deficits . Skin: Skin is warm, dry.  No rash or ulcers Psychiatric: Mood and affect are normal Speech and behavior are normal   Labs on Admission: I have personally reviewed following labs and imaging studies  CBC: Recent Labs  Lab 09/19/19 1055  WBC 8.1  HGB 11.7*  HCT 34.7*  MCV 92.8  PLT 270  Basic Metabolic Panel: Recent Labs  Lab 09/19/19 1055  NA 134*  K 4.1  CL 92*  CO2  29  GLUCOSE 93  BUN 30*  CREATININE 0.72  CALCIUM 9.9   GFR: Estimated Creatinine Clearance: 43.7 mL/min (by C-G formula based on SCr of 0.72 mg/dL). Liver Function Tests: Recent Labs  Lab 09/19/19 1055  AST 21  ALT 14  ALKPHOS 297*  BILITOT 1.0  PROT 7.0  ALBUMIN 4.2   No results for input(s): LIPASE, AMYLASE in the last 168 hours. No results for input(s): AMMONIA in the last 168 hours. Coagulation Profile: No results for input(s): INR, PROTIME in the last 168 hours. Cardiac Enzymes: No results for input(s): CKTOTAL, CKMB, CKMBINDEX, TROPONINI in the last 168 hours. BNP (last 3 results) No results for input(s): PROBNP in the last 8760 hours. HbA1C: No results for input(s): HGBA1C in the last 72 hours. CBG: No results for input(s): GLUCAP in the last 168 hours. Lipid Profile: No results for input(s): CHOL, HDL, LDLCALC, TRIG, CHOLHDL, LDLDIRECT in the last 72 hours. Thyroid Function Tests: No results for input(s): TSH, T4TOTAL, FREET4, T3FREE, THYROIDAB in the last 72 hours. Anemia Panel: No results for input(s): VITAMINB12, FOLATE, FERRITIN, TIBC, IRON, RETICCTPCT in the last 72 hours. Urine analysis:    Component Value Date/Time   COLORURINE YELLOW (A) 01/28/2017 1136   APPEARANCEUR HAZY (A) 01/28/2017 1136   LABSPEC 1.010 01/28/2017 1136   PHURINE 8.0 01/28/2017 1136   GLUCOSEU NEGATIVE 01/28/2017 1136   HGBUR NEGATIVE 01/28/2017 1136   BILIRUBINUR NEGATIVE 01/28/2017 1136   KETONESUR NEGATIVE 01/28/2017 1136   PROTEINUR NEGATIVE 01/28/2017 1136   NITRITE NEGATIVE 01/28/2017 1136   LEUKOCYTESUR NEGATIVE 01/28/2017 1136    Radiological Exams on Admission: DG Lumbar Spine 2-3 Views  Result Date: 09/19/2019 CLINICAL DATA:  Lower back pain. EXAM: LUMBAR SPINE - 2-3 VIEW COMPARISON:  February 10, 2019 FINDINGS: There is no evidence of acute lumbar spine fracture. Evidence of prior vertebroplasty is seen at the levels of T12 through L4. There is stable moderate to  marked severity dextroscoliosis. Mild multilevel intervertebral disc space narrowing is seen. Radiopaque surgical clips are seen within the medial aspect of the mid right abdomen. IMPRESSION: Stable moderate to marked severity dextroscoliosis with evidence of prior vertebroplasty at the levels of T12 through L4. Electronically Signed   By: Virgina Norfolk M.D.   On: 09/19/2019 17:26   CT Chest Wo Contrast  Result Date: 09/20/2019 CLINICAL DATA:  Increasing back pain EXAM: CT CHEST WITHOUT CONTRAST TECHNIQUE: Multidetector CT imaging of the chest was performed following the standard protocol without IV contrast. COMPARISON:  CT 07/08/2019 FINDINGS: Cardiovascular: Cardiac size at the upper limits of normal. Small volume pericardial effusion. Coronary artery calcifications are present. Atherosclerotic plaque within the normal caliber aorta. Normal 3 vessel branching of the aortic arch. Minimal plaque in the proximal great vessels. Central pulmonary arteries are normal caliber. No major venous abnormality. Mediastinum/Nodes: No mediastinal fluid or gas. 8 mm hypoattenuating nodule in the right lobe thyroid gland, requiring no further evaluation in a patient of this age. No acute abnormality of the trachea. Moderate hiatal hernia containing much of the proximal stomach. No worrisome mediastinal or axillary adenopathy. Hilar nodal evaluation is limited in the absence of intravenous contrast media. Lungs/Pleura: Some residual tree-in-bud nodularity is again seen in the anterior segment left lower lobe, lingula, right middle lobe and right basilar periphery. Overall however the extent is diminished from the prior exam could reflect some incomplete  resolution of the infectious process seen on prior versus a more smoldering infectious/inflammatory process including atypical mycobacterial infection particularly given some associated areas of scarring and architectural distortion as well as cicatricial bronchiectatic  change and diffuse airways thickening. Some more dependent atelectatic changes posteriorly. Few slightly larger nodules are seen in the lingula measuring up to 7 mm (3/96) previously 9 mm and in the anterior left lower lobe measuring up to 6 mm (3/91), previously 11 mm. Additional clustered stable nodules are seen in the periphery of the right lower lobe measuring up to 9 mm in size (3/98). Chronic bilateral pleural effusions are noted, right slightly greater than left. Extensive biapical pleuroparenchymal scarring is unchanged from prior. No pneumothorax. Upper Abdomen: Hiatal hernia as above. Upper abdominal atherosclerosis. No other acute upper abdominal abnormalities are evident. Musculoskeletal: Increasingly sclerotic appearance of a segmental fracture of the sternum suggesting a nonunited fracture deformity. Redemonstration of the prior compression deformities spanning T11-L2 with vertebroplasty changes grossly similar to the comparison study. However, there is a new sclerotic endplate deformity at the inferior endplate T10 likely reflecting a development of a subacute compression fracture with approximately 10% height loss. There are a multitude of subacute to chronic appearing and nonunion fractures of the bilateral ribs, this includes a fracture of the right fourth rib anteriorly, right tenth and eleventh ribs posteriorly, as well as the left seventh through ninth ribs lateral to posterolaterally. These are all grossly similar to comparison. No new displaced rib fractures. IMPRESSION: 1. Increasingly sclerotic appearance of a segmental fracture of the sternum suggesting a nonunited fracture deformity. 2. New sclerotic endplate deformity at the inferior endplate T10 likely reflecting a development of a subacute compression fracture with approximately 10% height loss. More chronic compression deformities with post vertebroplasty changes T11-L2, similar to comparison. 3. Multiple additional subacute to chronic  appearing and nonunion fractures of the bilateral ribs are not significantly changed from prior. 4. Persistent tree-in-bud nodularity in the anterior segment left lower lobe, lingula, right middle lobe and right basilar periphery. Additional clustered subcentimeter nodules present in the right lower lobe periphery. Overall diminished from the prior exam could reflect some incomplete resolution of the infectious process seen on prior versus a more smoldering infectious/inflammatory process including atypical mycobacterial infection with likely associated areas of scarring and architectural distortion and bronchiectasis. Non-contrast chest CT at 3-6 months from time of initial imaging (June 2021) is recommended. If the nodules are stable at time of repeat CT, then future CT at 18-24 months (from today's scan) is considered optional for low-risk patients, but is recommended for high-risk patients. This recommendation follows the consensus statement: Guidelines for Management of Incidental Pulmonary Nodules Detected on CT Images: From the Fleischner Society 2017; Radiology 2017; 284:228-243. 5. Chronic bilateral pleural effusions, right slightly greater than left. 6. Moderate hiatal hernia containing much of the proximal stomach. 7. Aortic Atherosclerosis (ICD10-I70.0). Electronically Signed   By: Lovena Le M.D.   On: 09/20/2019 01:58   DG Shoulder Left  Result Date: 09/19/2019 CLINICAL DATA:  Left shoulder pain EXAM: LEFT SHOULDER - 2+ VIEW COMPARISON:  None. FINDINGS: Three view radiograph left shoulder demonstrates normal alignment. There are subacute fractures of the left sixth and seventh ribs posterolaterally. No acute fracture or dislocation. Acromioclavicular joint space is preserved. Glenohumeral joint space is not well profiled. Limited evaluation of the left apex is unremarkable. IMPRESSION: Subacute fractures of the left sixth and seventh ribs posterolaterally. No acute fracture or dislocation.  Electronically Signed   By: Cassandria Anger  Christa See MD   On: 09/19/2019 23:46    EKG: Independently reviewed. Interpretation :   Assessment/Plan 79 year old female with history of osteoporosis with multiple vertebral compression fractures status post multiple kyphoplasty's, frequent falls with chronic low back pain, presenting with acute left shoulder pain and low back pain related to accidental fall not responding to home oxycodone   Accidental fall  Acute pain of left shoulder due to trauma History of frequent falls History of osteoporotic and traumatic fractures, ribs, vertebra Frailty with suspect protein calorie malnutrition -Pain control -Physical therapy evaluation, nutritionist consult    Chronic diastolic CHF (congestive heart failure), NYHA class 3 ([place]) -Continue home meds pending med rec    Paroxysmal atrial fibrillation ([place]) -Not currently on anticoagulation likely related to history of falls -Continue Lasix, diltiazem.  Also on indapamide      DVT prophylaxis: Lovenox  Code Status: DNR Family Communication: Niece on phone Disposition Plan: Back to previous home environment Consults called: none  Status: Observation    Athena Masse MD [facility]     09/20/2019, 4:24 AM

## 2019-09-20 NOTE — Plan of Care (Signed)
New care plan initiated 

## 2019-09-20 NOTE — Progress Notes (Signed)
Patient seen and examined.  She complains of back pain.  Vital signs are stable.  She said she lives at home with her husband who is demented but they have  caregivers who help at home.  However, she is open to going to rehab if she qualifies.  Continue current management.  Plan of care was discussed with patient and her niece at the bedside.

## 2019-09-21 DIAGNOSIS — W19XXXD Unspecified fall, subsequent encounter: Secondary | ICD-10-CM | POA: Diagnosis not present

## 2019-09-21 DIAGNOSIS — I5032 Chronic diastolic (congestive) heart failure: Secondary | ICD-10-CM | POA: Diagnosis not present

## 2019-09-21 DIAGNOSIS — M25512 Pain in left shoulder: Secondary | ICD-10-CM

## 2019-09-21 DIAGNOSIS — G8911 Acute pain due to trauma: Secondary | ICD-10-CM | POA: Diagnosis not present

## 2019-09-21 NOTE — Progress Notes (Signed)
Physical Therapy Treatment Patient Details Name: Katie Harding MRN: [id number] DOB: [birth year] Today's Date: 09/21/2019    History of Present Illness Per MD Note: Katie Harding is a 79 y.o. female with medical history significant for osteoporosis with history of frequent falls, compression fractures status post multiple kyphoplasties with chronic low back pain and multiple rib fractures in June 2021 from a fall who presents to the emergency room with low back pain and left shoulder pain following a mechanical fall at home after she tripped over the edge of a couch.  She tried her home oxycodone without relief.  She denies lower extremity numbness tingling, saddle anesthesia or bowel or bladder retention or incontinence.    PT Comments    Pt in bed,  Reports pain last night was unbearable but willing to try today.  She is able to get OOB with rail and increased time.  Stated she sleeps in a recliner at home all the time due to reflux.  She is able to stand and walk 8' to commode and back to recliner with RW and min a x 1.  Short shuffling steps.  Remains in recliner after session.  Discussed at length discharge plan.  She voices several times during session "Please don't send me home today"  She feels pain remains too great to go home.  She stated she has 24 hour aides for her husband at home and he is unable to assist her.  Asked if aides can provide care for her too and she is encouraged to call and have them see services available to her also.  They do seem to help her out some already but she was mostly independent before admission.  She does require +1 assist for mobility at this time.  After discussion she continues to feel SNF would be a better choice for her.  Discussed with Care Manager who will speak with pt and proceed as appropriate given her request for SNF if discharged in the nest day or so.   Follow Up Recommendations  Home health PT;Other (comment) See above.     Equipment  Recommendations  Rolling walker with 5" wheels    Recommendations for Other Services       Precautions / Restrictions Precautions Precautions: Fall Restrictions Other Position/Activity Restrictions: difficulty raising L UE/pushing off L UE    Mobility  Bed Mobility Overal bed mobility: Modified Independent Bed Mobility: Supine to Sit     Supine to sit: Supervision     General bed mobility comments: uses rail but no assist. sleeps in recliner at home.  Transfers Overall transfer level: Needs assistance Equipment used: Rolling walker (2 wheeled) Transfers: Sit to/from Stand Sit to Stand: Min assist         General transfer comment: cues for safety and posture  Ambulation/Gait Ambulation/Gait assistance: Min assist Gait Distance (Feet): 8 Feet Assistive device: Rolling walker (2 wheeled) Gait Pattern/deviations: Step-through pattern;Decreased step length - right;Decreased step length - left;Trunk flexed Gait velocity: decreased   General Gait Details: short shuffling steps.   Stairs             Wheelchair Mobility    Modified Rankin (Stroke Patients Only)       Balance Overall balance assessment: Needs assistance Sitting-balance support: Feet supported Sitting balance-Leahy Scale: Good     Standing balance support: Bilateral upper extremity supported Standing balance-Leahy Scale: Poor Standing balance comment: needs UE support  Cognition Arousal/Alertness: Awake/alert Behavior During Therapy: WFL for tasks assessed/performed Overall Cognitive Status: Within Functional Limits for tasks assessed                                        Exercises Other Exercises Other Exercises: to commode to void.  no assist for self care    General Comments        Pertinent Vitals/Pain Pain Assessment: Faces Faces Pain Scale: Hurts even more Pain Location: lower back, ribs.  general soreness with  gait.  reported pain last night was "unbearable" Pain Descriptors / Indicators: Aching;Sore Pain Intervention(s): Limited activity within patient's tolerance;Monitored during session;Repositioned    Home Living                      Prior Function            PT Goals (current goals can now be found in the care plan section) Progress towards PT goals: Progressing toward goals    Frequency    7X/week      PT Plan Current plan remains appropriate    Co-evaluation              AM-PAC PT "6 Clicks" Mobility   Outcome Measure  Help needed turning from your back to your side while in a flat bed without using bedrails?: A Little Help needed moving from lying on your back to sitting on the side of a flat bed without using bedrails?: A Little Help needed moving to and from a bed to a chair (including a wheelchair)?: A Little Help needed standing up from a chair using your arms (e.g., wheelchair or bedside chair)?: A Little Help needed to walk in hospital room?: A Little Help needed climbing 3-5 steps with a railing? : A Little 6 Click Score: 18    End of Session Equipment Utilized During Treatment: Gait belt Activity Tolerance: Patient tolerated treatment well Patient left: in chair;with call bell/phone within reach;with chair alarm set Nurse Communication: Mobility status       Time: 5366-4403 PT Time Calculation (min) (ACUTE ONLY): 16 min  Charges:  $Gait Training: 8-22 mins                    Chesley Noon, PTA 09/21/19, 9:45 AM

## 2019-09-21 NOTE — Progress Notes (Addendum)
Progress Note    Katie Harding  HYI:[id number] DOB: October 26, 1940  DOA: 09/19/2019 PCP: Rusty Aus, MD      Brief Narrative:    Medical records reviewed and are as summarized below:  Katie Harding is a 79 y.o. female       Assessment/Plan:   Principal Problem:   Accidental fall Active Problems:   Chronic diastolic CHF (congestive heart failure), NYHA class 3 (HCC)   Paroxysmal atrial fibrillation (HCC)   Acute pain of left shoulder due to trauma   Frequent falls   Multiple fractures   Frailty    S/p fall at home Paroxysmal atrial fibrillation Subacute T10 compression fracture Old multiple rib fractures Acute left shoulder pain Chronic diastolic CHF Frailty/debility/frequent falls History of breast cancer in remission  PLAN   Analgesics as needed for pain Continue home meds for comorbidities as listed below PT recommended home health but OT recommended SNF.  Initially, patient wanted to go to SNF but according to case manager, her preferred SNF is not available.  Patient therefore wants to go home with home health therapy.  However, she does not want to go home today because she said her caregivers will not be available until tomorrow.  She is worried about not being able to take care of herself at home today.  For safety reasons, patient will be kept overnight in the hospital with plans to discharge home tomorrow.     Body mass index is 17.59 kg/m.  Diet Order            Diet - low sodium heart healthy           Diet Heart Room service appropriate? Yes; Fluid consistency: Thin  Diet effective now                        Medications:   . aspirin EC  81 mg Oral Daily  . atorvastatin  40 mg Oral Daily  . calcium citrate-vitamin D  1 tablet Oral BID  . cholecalciferol  2,000 Units Oral Daily  . diltiazem  120 mg Oral Daily  . enoxaparin (LOVENOX) injection  40 mg Subcutaneous Q24H  . furosemide  20 mg Oral Daily  . magnesium  oxide  400 mg Oral BID  . metoCLOPramide  5 mg Oral TID  . pantoprazole  40 mg Oral Daily  . polyethylene glycol  17 g Oral Daily  . propranolol  40 mg Oral TID  . sucralfate  1 g Oral TID   Continuous Infusions:   Anti-infectives (From admission, onward)   None             Family Communication/Anticipated D/C date and plan/Code Status   DVT prophylaxis: enoxaparin (LOVENOX) injection 40 mg Start: 09/20/19 1000     Code Status: DNR  Family Communication: Plan discussed with patient Disposition Plan:    Status is: Observation  The patient remains OBS appropriate and will d/c before 2 midnights.  Dispo: The patient is from: Home              Anticipated d/c is to: Home              Anticipated d/c date is: 1 day              Patient currently is medically stable to d/c.           Subjective:   C/o upper back pain and left  shoulder pain.   Objective:    Vitals:   09/20/19 0821 09/20/19 1539 09/20/19 2342 09/21/19 0808  BP: 121/82 129/82 129/81 (!) 158/87  Pulse: 87 (!) 101 68 74  Resp: 16 16 17 17   Temp: 98.2 F (36.8 C) 98 F (36.7 C) 98.1 F (36.7 C) 98.1 F (36.7 C)  TempSrc: Oral Oral Oral Oral  SpO2: 100% 95% 96% 98%  Weight:      Height:       No data found.   Intake/Output Summary (Last 24 hours) at 09/21/2019 1443 Last data filed at 09/21/2019 0948 Gross per 24 hour  Intake 120 ml  Output --  Net 120 ml   Filed Weights   09/19/19 1051  Weight: 49.4 kg    Exam:  GEN: NAD SKIN: No rash EYES: EOMI ENT: MMM CV: RRR PULM: CTA B ABD: soft, ND, NT, +BS CNS: AAO x 3, non focal EXT: No edema or tenderness    Data Reviewed:   I have personally reviewed following labs and imaging studies:  Labs: Labs show the following:   Basic Metabolic Panel: Recent Labs  Lab 09/19/19 1055 09/20/19 0540  NA 134*  --   K 4.1  --   CL 92*  --   CO2 29  --   GLUCOSE 93  --   BUN 30*  --   CREATININE 0.72 0.80  CALCIUM 9.9   --    GFR Estimated Creatinine Clearance: 43.7 mL/min (by C-G formula based on SCr of 0.8 mg/dL). Liver Function Tests: Recent Labs  Lab 09/19/19 1055  AST 21  ALT 14  ALKPHOS 297*  BILITOT 1.0  PROT 7.0  ALBUMIN 4.2   No results for input(s): LIPASE, AMYLASE in the last 168 hours. No results for input(s): AMMONIA in the last 168 hours. Coagulation profile No results for input(s): INR, PROTIME in the last 168 hours.  CBC: Recent Labs  Lab 09/19/19 1055 09/20/19 0540  WBC 8.1 7.1  HGB 11.7* 12.5  HCT 34.7* 37.1  MCV 92.8 93.2  PLT 270 291   Cardiac Enzymes: No results for input(s): CKTOTAL, CKMB, CKMBINDEX, TROPONINI in the last 168 hours. BNP (last 3 results) No results for input(s): PROBNP in the last 8760 hours. CBG: No results for input(s): GLUCAP in the last 168 hours. D-Dimer: No results for input(s): DDIMER in the last 72 hours. Hgb A1c: No results for input(s): HGBA1C in the last 72 hours. Lipid Profile: No results for input(s): CHOL, HDL, LDLCALC, TRIG, CHOLHDL, LDLDIRECT in the last 72 hours. Thyroid function studies: No results for input(s): TSH, T4TOTAL, T3FREE, THYROIDAB in the last 72 hours.  Invalid input(s): FREET3 Anemia work up: No results for input(s): VITAMINB12, FOLATE, FERRITIN, TIBC, IRON, RETICCTPCT in the last 72 hours. Sepsis Labs: Recent Labs  Lab 09/19/19 1055 09/20/19 0540  WBC 8.1 7.1    Microbiology No results found for this or any previous visit (from the past 240 hour(s)).  Procedures and diagnostic studies:  DG Lumbar Spine 2-3 Views  Result Date: 09/19/2019 CLINICAL DATA:  Lower back pain. EXAM: LUMBAR SPINE - 2-3 VIEW COMPARISON:  February 10, 2019 FINDINGS: There is no evidence of acute lumbar spine fracture. Evidence of prior vertebroplasty is seen at the levels of T12 through L4. There is stable moderate to marked severity dextroscoliosis. Mild multilevel intervertebral disc space narrowing is seen. Radiopaque  surgical clips are seen within the medial aspect of the mid right abdomen. IMPRESSION: Stable moderate to marked severity  dextroscoliosis with evidence of prior vertebroplasty at the levels of T12 through L4. Electronically Signed   By: Virgina Norfolk M.D.   On: 09/19/2019 17:26   CT Chest Wo Contrast  Result Date: 09/20/2019 CLINICAL DATA:  Increasing back pain EXAM: CT CHEST WITHOUT CONTRAST TECHNIQUE: Multidetector CT imaging of the chest was performed following the standard protocol without IV contrast. COMPARISON:  CT 07/08/2019 FINDINGS: Cardiovascular: Cardiac size at the upper limits of normal. Small volume pericardial effusion. Coronary artery calcifications are present. Atherosclerotic plaque within the normal caliber aorta. Normal 3 vessel branching of the aortic arch. Minimal plaque in the proximal great vessels. Central pulmonary arteries are normal caliber. No major venous abnormality. Mediastinum/Nodes: No mediastinal fluid or gas. 8 mm hypoattenuating nodule in the right lobe thyroid gland, requiring no further evaluation in a patient of this age. No acute abnormality of the trachea. Moderate hiatal hernia containing much of the proximal stomach. No worrisome mediastinal or axillary adenopathy. Hilar nodal evaluation is limited in the absence of intravenous contrast media. Lungs/Pleura: Some residual tree-in-bud nodularity is again seen in the anterior segment left lower lobe, lingula, right middle lobe and right basilar periphery. Overall however the extent is diminished from the prior exam could reflect some incomplete resolution of the infectious process seen on prior versus a more smoldering infectious/inflammatory process including atypical mycobacterial infection particularly given some associated areas of scarring and architectural distortion as well as cicatricial bronchiectatic change and diffuse airways thickening. Some more dependent atelectatic changes posteriorly. Few slightly  larger nodules are seen in the lingula measuring up to 7 mm (3/96) previously 9 mm and in the anterior left lower lobe measuring up to 6 mm (3/91), previously 11 mm. Additional clustered stable nodules are seen in the periphery of the right lower lobe measuring up to 9 mm in size (3/98). Chronic bilateral pleural effusions are noted, right slightly greater than left. Extensive biapical pleuroparenchymal scarring is unchanged from prior. No pneumothorax. Upper Abdomen: Hiatal hernia as above. Upper abdominal atherosclerosis. No other acute upper abdominal abnormalities are evident. Musculoskeletal: Increasingly sclerotic appearance of a segmental fracture of the sternum suggesting a nonunited fracture deformity. Redemonstration of the prior compression deformities spanning T11-L2 with vertebroplasty changes grossly similar to the comparison study. However, there is a new sclerotic endplate deformity at the inferior endplate T10 likely reflecting a development of a subacute compression fracture with approximately 10% height loss. There are a multitude of subacute to chronic appearing and nonunion fractures of the bilateral ribs, this includes a fracture of the right fourth rib anteriorly, right tenth and eleventh ribs posteriorly, as well as the left seventh through ninth ribs lateral to posterolaterally. These are all grossly similar to comparison. No new displaced rib fractures. IMPRESSION: 1. Increasingly sclerotic appearance of a segmental fracture of the sternum suggesting a nonunited fracture deformity. 2. New sclerotic endplate deformity at the inferior endplate T10 likely reflecting a development of a subacute compression fracture with approximately 10% height loss. More chronic compression deformities with post vertebroplasty changes T11-L2, similar to comparison. 3. Multiple additional subacute to chronic appearing and nonunion fractures of the bilateral ribs are not significantly changed from prior. 4.  Persistent tree-in-bud nodularity in the anterior segment left lower lobe, lingula, right middle lobe and right basilar periphery. Additional clustered subcentimeter nodules present in the right lower lobe periphery. Overall diminished from the prior exam could reflect some incomplete resolution of the infectious process seen on prior versus a more smoldering infectious/inflammatory process including atypical  mycobacterial infection with likely associated areas of scarring and architectural distortion and bronchiectasis. Non-contrast chest CT at 3-6 months from time of initial imaging (June 2021) is recommended. If the nodules are stable at time of repeat CT, then future CT at 18-24 months (from today's scan) is considered optional for low-risk patients, but is recommended for high-risk patients. This recommendation follows the consensus statement: Guidelines for Management of Incidental Pulmonary Nodules Detected on CT Images: From the Fleischner Society 2017; Radiology 2017; 284:228-243. 5. Chronic bilateral pleural effusions, right slightly greater than left. 6. Moderate hiatal hernia containing much of the proximal stomach. 7. Aortic Atherosclerosis (ICD10-I70.0). Electronically Signed   By: Lovena Le M.D.   On: 09/20/2019 01:58   DG Shoulder Left  Result Date: 09/19/2019 CLINICAL DATA:  Left shoulder pain EXAM: LEFT SHOULDER - 2+ VIEW COMPARISON:  None. FINDINGS: Three view radiograph left shoulder demonstrates normal alignment. There are subacute fractures of the left sixth and seventh ribs posterolaterally. No acute fracture or dislocation. Acromioclavicular joint space is preserved. Glenohumeral joint space is not well profiled. Limited evaluation of the left apex is unremarkable. IMPRESSION: Subacute fractures of the left sixth and seventh ribs posterolaterally. No acute fracture or dislocation. Electronically Signed   By: Fidela Salisbury MD   On: 09/19/2019 23:46               LOS: 0  days   Larrie Lucia  [facility]   Pager on [email or web address]. If 7PM-7AM, please contact night-coverage at [email or web address]     09/21/2019, 2:43 PM

## 2019-09-21 NOTE — TOC Progression Note (Signed)
Transition of Care [facility]) - Progression Note    Patient Details  Name: DESHAWN SKELLEY MRN: [id number] Date of Birth: [birth year]  Transition of Care [facility]) CM/SW [place], RN Phone Number: 09/21/2019, 11:10 AM  Clinical Narrative:    Met with the patient in the room and spoke with her niece Suanne Marker on the phone while at the bedside, She lives at home with her husband in [place] living and has a 24 hour care giver that helps with her husband, they are looking to extend the care to help her as well, The name of the company is living at home, she is wanting to use [facility] PT and OT as she has used them before, I called Helene Kelp and arranged.  She has a Corporate investment banker at home and does not need additional DME, her niece helps at home as well and provides transportation    Expected Discharge Plan: [place] Barriers to Discharge: Barriers Resolved  Expected Discharge Plan and Services Expected Discharge Plan: [place]   Discharge Planning Services: CM Consult   Living arrangements for the past 2 months: [place]                 DME Arranged: N/A         HH Arranged: PT, OT [place] Agency: [facility] [employer] [facility] [employer] Date [place]: 09/21/19 Time [place]: 1108 Representative spoke with at [place]: [place] ([place]) Interventions    Readmission Risk Interventions No flowsheet data found.

## 2019-09-22 DIAGNOSIS — W19XXXD Unspecified fall, subsequent encounter: Secondary | ICD-10-CM | POA: Diagnosis not present

## 2019-09-22 DIAGNOSIS — M25512 Pain in left shoulder: Secondary | ICD-10-CM | POA: Diagnosis not present

## 2019-09-22 DIAGNOSIS — I5032 Chronic diastolic (congestive) heart failure: Secondary | ICD-10-CM | POA: Diagnosis not present

## 2019-09-22 DIAGNOSIS — R296 Repeated falls: Secondary | ICD-10-CM | POA: Diagnosis not present

## 2019-09-22 NOTE — Progress Notes (Signed)
Discharge summary reviewed with verbal understanding. Answered all question with patient and niece. Escorted to personal vehicle via wc

## 2019-09-22 NOTE — Progress Notes (Signed)
Occupational Therapy Treatment Patient Details Name: Katie Harding MRN: [id number] DOB: 01-18-1941 Today's Date: 09/22/2019    History of present illness Per MD Note: Katie Harding is a 79 y.o. female with medical history significant for osteoporosis with history of frequent falls, compression fractures status post multiple kyphoplasties with chronic low back pain and multiple rib fractures in June 2021 from a fall who presents to the emergency room with low back pain and left shoulder pain following a mechanical fall at home after she tripped over the edge of a couch.  She tried her home oxycodone without relief.  She denies lower extremity numbness tingling, saddle anesthesia or bowel or bladder retention or incontinence.   OT comments  Pt seen for OT tx this am. Pt expressed being anxious about [place] services and having that in place prior to discharge. Reassurance provided and message sent to CM to address. Pt also asked questions about needing BLE re-wrapped; RN notified to address. Pt instructed in AE for LB dressing and ADL modifications to maximize safety/indep while minimizing low back pain and falls risk. Pt able to return demo with MOD assist and verbal cues for technique. Pt continues to benefit from skilled OT Services. D/c rec updated to [facility] services.    Follow Up Recommendations  Home health OT;Supervision/Assistance - 24 hour    Equipment Recommendations  3 in 1 bedside commode    Recommendations for Other Services      Precautions / Restrictions Precautions Precautions: Fall Restrictions Weight Bearing Restrictions: No Other Position/Activity Restrictions: back pain       Mobility Bed Mobility               General bed mobility comments: in recliner before and after session  Transfers Overall transfer level: Needs assistance Equipment used: Rolling walker (2 wheeled) Transfers: Sit to/from Stand Sit to Stand: Min guard         General transfer  comment: cues for safety and posture    Balance Overall balance assessment: Needs assistance Sitting-balance support: Feet supported Sitting balance-Leahy Scale: Good     Standing balance support: Bilateral upper extremity supported Standing balance-Leahy Scale: Poor Standing balance comment: needs UE support                           ADL either performed or assessed with clinical judgement   ADL Overall ADL's : Needs assistance/impaired                     Lower Body Dressing: Moderate assistance;Sit to/from stand;With adaptive equipment Lower Body Dressing Details (indicate cue type and reason): pt instructed in AE for LB dressing, pt able to return demo requiring additional cues and ultimately Mod A                     Vision Baseline Vision/History: Wears glasses Wears Glasses: At all times Patient Visual Report: No change from baseline     Perception     Praxis      Cognition Arousal/Alertness: Awake/alert Behavior During Therapy: Anxious Overall Cognitive Status: Within Functional Limits for tasks assessed                                 General Comments: pt anxious about plan prior to discharge, CM notified        Exercises Other Exercises Other Exercises: Pt  instructed in AE for LB dressing and ADL modifications to maximize safety/indep while minimizing low back pain and falls risk. Pt able to return demo with MOD assist and verbal cues for technique   Shoulder Instructions       General Comments      Pertinent Vitals/ Pain       Pain Assessment: 0-10 Pain Score: 7  Faces Pain Scale: Hurts little more Pain Location: lower back,L shoulder Pain Descriptors / Indicators: Aching;Sore Pain Intervention(s): Limited activity within patient's tolerance;Monitored during session;Repositioned;Patient requesting pain meds-RN notified  Home Living                                          Prior  Functioning/Environment              Frequency  Min 1X/week        Progress Toward Goals  OT Goals(current goals can now be found in the care plan section)  Progress towards OT goals: Progressing toward goals  Acute Rehab OT Goals Patient Stated Goal: to ambualte OT Goal Formulation: With patient Time For Goal Achievement: 10/04/19 Potential to Achieve Goals: Good  Plan Frequency remains appropriate;Discharge plan needs to be updated    Co-evaluation                 AM-PAC OT "6 Clicks" Daily Activity     Outcome Measure   Help from another person eating meals?: None Help from another person taking care of personal grooming?: A Little Help from another person toileting, which includes using toliet, bedpan, or urinal?: A Little Help from another person bathing (including washing, rinsing, drying)?: A Little Help from another person to put on and taking off regular upper body clothing?: A Little Help from another person to put on and taking off regular lower body clothing?: A Lot 6 Click Score: 18    End of Session    OT Visit Diagnosis: Unsteadiness on feet (R26.81);Other abnormalities of gait and mobility (R26.89);Muscle weakness (generalized) (M62.81)   Activity Tolerance Patient tolerated treatment well;Patient limited by pain   Patient Left in chair;with call bell/phone within reach;with chair alarm set   Nurse Communication Patient requests pain meds        Time: 8889-1694 OT Time Calculation (min): 24 min  Charges: OT General Charges $OT Visit: 1 Visit OT Treatments $Self Care/Home Management : 23-37 mins  Jeni Salles, MPH, MS, OTR/L ascom [phone] 09/22/19, 10:12 AM

## 2019-09-22 NOTE — Progress Notes (Signed)
Physical Therapy Treatment Patient Details Name: Katie Harding MRN: [id number] DOB: Apr 19, 1940 Today's Date: 09/22/2019    History of Present Illness Per MD Note: Katie Harding is a 79 y.o. female with medical history significant for osteoporosis with history of frequent falls, compression fractures status post multiple kyphoplasties with chronic low back pain and multiple rib fractures in June 2021 from a fall who presents to the emergency room with low back pain and left shoulder pain following a mechanical fall at home after she tripped over the edge of a couch.  She tried her home oxycodone without relief.  She denies lower extremity numbness tingling, saddle anesthesia or bowel or bladder retention or incontinence.    PT Comments    Pt getting off commode with tech upon arrival.  She is able to stand and progress gait 40' with RW and min guard.  Overall improved gait today but continues to require +1 assist for mobility skills.  She stated she remains anxious over discharge home but feels better about it today.   Follow Up Recommendations  Home health PT;Other (comment)     Equipment Recommendations  Rolling walker with 5" wheels    Recommendations for Other Services       Precautions / Restrictions Precautions Precautions: Fall Restrictions Weight Bearing Restrictions: No Other Position/Activity Restrictions: back pain    Mobility  Bed Mobility               General bed mobility comments: in recliner before and after session  Transfers Overall transfer level: Needs assistance Equipment used: Rolling walker (2 wheeled) Transfers: Sit to/from Stand Sit to Stand: Min guard         General transfer comment: cues for safety and posture  Ambulation/Gait Ambulation/Gait assistance: Min guard Gait Distance (Feet): 40 Feet Assistive device: Rolling walker (2 wheeled) Gait Pattern/deviations: Step-through pattern;Decreased step length - right;Decreased step  length - left;Trunk flexed Gait velocity: decreased   General Gait Details: short shuffling steps.   Stairs             Wheelchair Mobility    Modified Rankin (Stroke Patients Only)       Balance Overall balance assessment: Needs assistance Sitting-balance support: Feet supported Sitting balance-Leahy Scale: Good     Standing balance support: Bilateral upper extremity supported Standing balance-Leahy Scale: Poor Standing balance comment: needs UE support                            Cognition Arousal/Alertness: Awake/alert Behavior During Therapy: WFL for tasks assessed/performed Overall Cognitive Status: Within Functional Limits for tasks assessed                                        Exercises      General Comments        Pertinent Vitals/Pain Pain Assessment: Faces Faces Pain Scale: Hurts little more Pain Location: lower back, ribs.  general soreness with gait in back Pain Descriptors / Indicators: Aching;Sore Pain Intervention(s): Limited activity within patient's tolerance;Monitored during session;Premedicated before session    Home Living                      Prior Function            PT Goals (current goals can now be found in the care plan section) Progress towards  PT goals: Progressing toward goals    Frequency    7X/week      PT Plan Current plan remains appropriate    Co-evaluation              AM-PAC PT "6 Clicks" Mobility   Outcome Measure  Help needed turning from your back to your side while in a flat bed without using bedrails?: A Little Help needed moving from lying on your back to sitting on the side of a flat bed without using bedrails?: A Little Help needed moving to and from a bed to a chair (including a wheelchair)?: A Little Help needed standing up from a chair using your arms (e.g., wheelchair or bedside chair)?: A Little Help needed to walk in hospital room?: A  Little Help needed climbing 3-5 steps with a railing? : A Little 6 Click Score: 18    End of Session Equipment Utilized During Treatment: Gait belt Activity Tolerance: Patient tolerated treatment well Patient left: in chair;with call bell/phone within reach;with chair alarm set Nurse Communication: Mobility status       Time: 8250-5397 PT Time Calculation (min) (ACUTE ONLY): 8 min  Charges:  $Gait Training: 8-22 mins                    Chesley Noon, PTA 09/22/19, 9:04 AM

## 2019-09-23 ENCOUNTER — Ambulatory Visit: Payer: Medicare PPO | Admitting: Family

## 2019-09-23 NOTE — Discharge Summary (Signed)
Physician Discharge Summary  Katie Harding WGN:[id number] DOB: 11/24/1940 DOA: 09/19/2019  PCP: Rusty Aus, MD  Admit date: 09/19/2019 Discharge date: 09/23/2019  Discharge disposition: Home with home health care   Recommendations for Outpatient Follow-Up:   Follow-up with PCP in 1 week   Discharge Diagnosis:   Principal Problem:   Accidental fall Active Problems:   Chronic diastolic CHF (congestive heart failure), NYHA class 3 (HCC)   Paroxysmal atrial fibrillation (HCC)   Acute pain of left shoulder due to trauma   Frequent falls   Multiple fractures   Frailty    Discharge Condition: Stable.  Diet recommendation:  Diet Order            Diet - low sodium heart healthy                   Code Status: Prior     Hospital Course:   Katie Harding is a 79 y.o. female with medical history significant for chronic diastolic CHF, h/o breast cancer in remission, paroxysmal atrial fibrillation, osteoporosis with history of frequent falls, compression fractures, status post multiple kyphoplasties, chronic low back pain and multiple rib fractures in June 2021.  She presented to the hospital because of low back pain and left shoulder pain after mechanical fall at home.  She said she tripped over the edge of a couch.  She took oxycodone at home but did not get any relief so she presented to the hospital for further evaluation.   Work-up revealed probable subacute compression fracture of T10 and multiple old fractures.  She was admitted to the hospital for pain control.  She was evaluated by PT and OT who recommended home health therapy.  Her condition has improved and she is deemed stable for discharge to home.  Discharge plan was discussed with the patient and her niece, Katie Harding.      Discharge Exam:    Vitals:   09/21/19 1510 09/21/19 2118 09/21/19 2354 09/22/19 0737  BP: 110/78 124/81 (!) 134/97 (!) 146/93  Pulse: 62 68 71 76  Resp: 16  16 16   Temp: 98.2 F  (36.8 C)  97.7 F (36.5 C) 98.2 F (36.8 C)  TempSrc: Oral  Oral Oral  SpO2: 97%  95% 96%  Weight:      Height:         GEN: NAD SKIN: Warm and dry EYES: EOMI ENT: MMM CV: RRR PULM: CTA B ABD: soft, ND, NT, +BS CNS: AAO x 3, non focal EXT: No edema or tenderness   The results of significant diagnostics from this hospitalization (including imaging, microbiology, ancillary and laboratory) are listed below for reference.     Procedures and Diagnostic Studies:   DG Lumbar Spine 2-3 Views  Result Date: 09/19/2019 CLINICAL DATA:  Lower back pain. EXAM: LUMBAR SPINE - 2-3 VIEW COMPARISON:  February 10, 2019 FINDINGS: There is no evidence of acute lumbar spine fracture. Evidence of prior vertebroplasty is seen at the levels of T12 through L4. There is stable moderate to marked severity dextroscoliosis. Mild multilevel intervertebral disc space narrowing is seen. Radiopaque surgical clips are seen within the medial aspect of the mid right abdomen. IMPRESSION: Stable moderate to marked severity dextroscoliosis with evidence of prior vertebroplasty at the levels of T12 through L4. Electronically Signed   By: Virgina Norfolk M.D.   On: 09/19/2019 17:26   CT Chest Wo Contrast  Result Date: 09/20/2019 CLINICAL DATA:  Increasing back pain EXAM: CT CHEST WITHOUT  CONTRAST TECHNIQUE: Multidetector CT imaging of the chest was performed following the standard protocol without IV contrast. COMPARISON:  CT 07/08/2019 FINDINGS: Cardiovascular: Cardiac size at the upper limits of normal. Small volume pericardial effusion. Coronary artery calcifications are present. Atherosclerotic plaque within the normal caliber aorta. Normal 3 vessel branching of the aortic arch. Minimal plaque in the proximal great vessels. Central pulmonary arteries are normal caliber. No major venous abnormality. Mediastinum/Nodes: No mediastinal fluid or gas. 8 mm hypoattenuating nodule in the right lobe thyroid gland, requiring no  further evaluation in a patient of this age. No acute abnormality of the trachea. Moderate hiatal hernia containing much of the proximal stomach. No worrisome mediastinal or axillary adenopathy. Hilar nodal evaluation is limited in the absence of intravenous contrast media. Lungs/Pleura: Some residual tree-in-bud nodularity is again seen in the anterior segment left lower lobe, lingula, right middle lobe and right basilar periphery. Overall however the extent is diminished from the prior exam could reflect some incomplete resolution of the infectious process seen on prior versus a more smoldering infectious/inflammatory process including atypical mycobacterial infection particularly given some associated areas of scarring and architectural distortion as well as cicatricial bronchiectatic change and diffuse airways thickening. Some more dependent atelectatic changes posteriorly. Few slightly larger nodules are seen in the lingula measuring up to 7 mm (3/96) previously 9 mm and in the anterior left lower lobe measuring up to 6 mm (3/91), previously 11 mm. Additional clustered stable nodules are seen in the periphery of the right lower lobe measuring up to 9 mm in size (3/98). Chronic bilateral pleural effusions are noted, right slightly greater than left. Extensive biapical pleuroparenchymal scarring is unchanged from prior. No pneumothorax. Upper Abdomen: Hiatal hernia as above. Upper abdominal atherosclerosis. No other acute upper abdominal abnormalities are evident. Musculoskeletal: Increasingly sclerotic appearance of a segmental fracture of the sternum suggesting a nonunited fracture deformity. Redemonstration of the prior compression deformities spanning T11-L2 with vertebroplasty changes grossly similar to the comparison study. However, there is a new sclerotic endplate deformity at the inferior endplate T10 likely reflecting a development of a subacute compression fracture with approximately 10% height loss.  There are a multitude of subacute to chronic appearing and nonunion fractures of the bilateral ribs, this includes a fracture of the right fourth rib anteriorly, right tenth and eleventh ribs posteriorly, as well as the left seventh through ninth ribs lateral to posterolaterally. These are all grossly similar to comparison. No new displaced rib fractures. IMPRESSION: 1. Increasingly sclerotic appearance of a segmental fracture of the sternum suggesting a nonunited fracture deformity. 2. New sclerotic endplate deformity at the inferior endplate T10 likely reflecting a development of a subacute compression fracture with approximately 10% height loss. More chronic compression deformities with post vertebroplasty changes T11-L2, similar to comparison. 3. Multiple additional subacute to chronic appearing and nonunion fractures of the bilateral ribs are not significantly changed from prior. 4. Persistent tree-in-bud nodularity in the anterior segment left lower lobe, lingula, right middle lobe and right basilar periphery. Additional clustered subcentimeter nodules present in the right lower lobe periphery. Overall diminished from the prior exam could reflect some incomplete resolution of the infectious process seen on prior versus a more smoldering infectious/inflammatory process including atypical mycobacterial infection with likely associated areas of scarring and architectural distortion and bronchiectasis. Non-contrast chest CT at 3-6 months from time of initial imaging (June 2021) is recommended. If the nodules are stable at time of repeat CT, then future CT at 18-24 months (from today's scan)  is considered optional for low-risk patients, but is recommended for high-risk patients. This recommendation follows the consensus statement: Guidelines for Management of Incidental Pulmonary Nodules Detected on CT Images: From the Fleischner Society 2017; Radiology 2017; 284:228-243. 5. Chronic bilateral pleural effusions,  right slightly greater than left. 6. Moderate hiatal hernia containing much of the proximal stomach. 7. Aortic Atherosclerosis (ICD10-I70.0). Electronically Signed   By: Lovena Le M.D.   On: 09/20/2019 01:58   DG Shoulder Left  Result Date: 09/19/2019 CLINICAL DATA:  Left shoulder pain EXAM: LEFT SHOULDER - 2+ VIEW COMPARISON:  None. FINDINGS: Three view radiograph left shoulder demonstrates normal alignment. There are subacute fractures of the left sixth and seventh ribs posterolaterally. No acute fracture or dislocation. Acromioclavicular joint space is preserved. Glenohumeral joint space is not well profiled. Limited evaluation of the left apex is unremarkable. IMPRESSION: Subacute fractures of the left sixth and seventh ribs posterolaterally. No acute fracture or dislocation. Electronically Signed   By: Fidela Salisbury MD   On: 09/19/2019 23:46     Labs:   Basic Metabolic Panel: Recent Labs  Lab 09/19/19 1055 09/20/19 0540  NA 134*  --   K 4.1  --   CL 92*  --   CO2 29  --   GLUCOSE 93  --   BUN 30*  --   CREATININE 0.72 0.80  CALCIUM 9.9  --    GFR Estimated Creatinine Clearance: 43.7 mL/min (by C-G formula based on SCr of 0.8 mg/dL). Liver Function Tests: Recent Labs  Lab 09/19/19 1055  AST 21  ALT 14  ALKPHOS 297*  BILITOT 1.0  PROT 7.0  ALBUMIN 4.2   No results for input(s): LIPASE, AMYLASE in the last 168 hours. No results for input(s): AMMONIA in the last 168 hours. Coagulation profile No results for input(s): INR, PROTIME in the last 168 hours.  CBC: Recent Labs  Lab 09/19/19 1055 09/20/19 0540  WBC 8.1 7.1  HGB 11.7* 12.5  HCT 34.7* 37.1  MCV 92.8 93.2  PLT 270 291   Cardiac Enzymes: No results for input(s): CKTOTAL, CKMB, CKMBINDEX, TROPONINI in the last 168 hours. BNP: Invalid input(s): POCBNP CBG: No results for input(s): GLUCAP in the last 168 hours. D-Dimer No results for input(s): DDIMER in the last 72 hours. Hgb A1c No results for  input(s): HGBA1C in the last 72 hours. Lipid Profile No results for input(s): CHOL, HDL, LDLCALC, TRIG, CHOLHDL, LDLDIRECT in the last 72 hours. Thyroid function studies No results for input(s): TSH, T4TOTAL, T3FREE, THYROIDAB in the last 72 hours.  Invalid input(s): FREET3 Anemia work up No results for input(s): VITAMINB12, FOLATE, FERRITIN, TIBC, IRON, RETICCTPCT in the last 72 hours. Microbiology No results found for this or any previous visit (from the past 240 hour(s)).   Discharge Instructions:   Discharge Instructions    Diet - low sodium heart healthy   Complete by: As directed    Face-to-face encounter (required for Medicare/Medicaid patients)   Complete by: As directed    I Amilcar Reever certify that this patient is under my care and that I, or a nurse practitioner or physician's assistant working with me, had a face-to-face encounter that meets the physician face-to-face encounter requirements with this patient on 09/22/2019. The encounter with the patient was in whole, or in part for the following medical condition(s) which is the primary reason for home health care (List medical condition): Subacute T10 fracture, frequent falls   The encounter with the patient was in whole,  or in part, for the following medical condition, which is the primary reason for home health care: Subacute T10 fracture, frequent falls   I certify that, based on my findings, the following services are medically necessary home health services: Physical therapy   Reason for Medically Necessary Home Health Services: Therapy- Personnel officer, Public librarian   My clinical findings support the need for the above services: Unable to leave home safely without assistance and/or assistive device   Further, I certify that my clinical findings support that this patient is homebound due to: Unable to leave home safely without assistance   For home use only DME 3 n 1   Complete by: As directed     Home Health   Complete by: As directed    To provide the following care/treatments:  PT OT     Increase activity slowly   Complete by: As directed    Schedule appointment   Complete by: As directed    Follow-up with PCP in 1 week   Walker rolling   Complete by: As directed      Allergies as of 09/22/2019      Reactions   Nsaids Other (See Comments)   Causes gastric burning, pain. History of gastric ulcer.   Hydrochlorothiazide Other (See Comments)   CAUSED A SEVERE DROP IN POTASSIUM LEVELS REQUIRING HOSPITALIZATION      Medication List    STOP taking these medications   metolazone 2.5 MG tablet Commonly known as: ZAROXOLYN     TAKE these medications   acetaminophen 325 MG tablet Commonly known as: TYLENOL Take 650 mg by mouth every 4 (four) hours as needed. For pain / increased temp Max dose for 24 hrs is 3000 mg from all sources of Apap/tyenol   ALPRAZolam 0.25 MG tablet Commonly known as: XANAX Take 0.5 tablets (0.125 mg total) by mouth 4 (four) times daily.   aspirin 81 MG EC tablet Take 1 tablet (81 mg total) by mouth daily. Swallow whole.   atorvastatin 40 MG tablet Commonly known as: LIPITOR Take 1 tablet (40 mg total) by mouth daily.   bisacodyl 5 MG EC tablet Commonly known as: DULCOLAX Take 5 mg by mouth daily as needed for moderate constipation.   calcium citrate-vitamin D 315-200 MG-UNIT tablet Commonly known as: CITRACAL+D Take 2 tablets by mouth 2 (two) times daily.   Cholecalciferol 25 MCG (1000 UT) capsule Take 2,000 Units by mouth daily. 2 caps   co-enzyme Q-10 30 MG capsule Take 30 mg by mouth daily. Notes to patient: Not given in hosapital   diazepam 2 MG tablet Commonly known as: Valium Take 1 tablet (2 mg total) by mouth every 8 (eight) hours as needed for up to 5 days for muscle spasms.   diltiazem 120 MG 24 hr capsule Commonly known as: CARDIZEM CD Take 120 mg by mouth daily.   feeding supplement (PRO-STAT SUGAR FREE 64)  Liqd Take 30 mLs by mouth 2 (two) times daily between meals. Notes to patient: Not given in hospital   furosemide 40 MG tablet Commonly known as: Lasix Take 0.5 tablets (20 mg total) by mouth daily for 20 days. What changed: how much to take   HYDROcodone-acetaminophen 5-325 MG tablet Commonly known as: NORCO/VICODIN Take 1 tablet by mouth every 6 (six) hours as needed for moderate pain.   magnesium oxide 400 MG tablet Commonly known as: MAG-OX Take 400 mg by mouth 2 (two) times daily.   metoCLOPramide 5 MG tablet  Commonly known as: REGLAN Take 5 mg by mouth 3 (three) times daily.   multivitamin tablet Take 1 tablet by mouth daily.   omeprazole 40 MG capsule Commonly known as: PRILOSEC Take 40 mg by mouth 2 (two) times daily.   polyethylene glycol 17 g packet Commonly known as: MIRALAX / GLYCOLAX Take 17 g by mouth daily.   potassium chloride SA 20 MEQ tablet Commonly known as: KLOR-CON Take 20 mEq by mouth once. Notes to patient: Not given in hospital   propranolol 40 MG tablet Commonly known as: INDERAL Take 40 mg by mouth 3 (three) times daily.   sucralfate 1 g tablet Commonly known as: CARAFATE Take 1 g by mouth 3 (three) times daily.   Tymlos 3120 MCG/1.56ML Sopn Generic drug: Abaloparatide Inject into the skin. Notes to patient: Not given in hospital            [facility]  (From admission, onward)         Start     Ordered   09/22/19 0000  For home use only DME 3 n 1        09/22/19 1119          Follow-up Information    [facility] In 4 days.   Specialty: Cardiology Why: Keep currently scheduled appointment Contact information: [address] [place]       Rusty Aus, MD In 1 week.   Specialty: Internal Medicine Contact information: [place]  [id number] [phone]                Time coordinating discharge: 29 minutes  Signed:  Jennye Boroughs  [facility] 09/23/2019, 11:08 AM   Pager on [email or web address]. If 7PM-7AM, please contact night-coverage at [email or web address]

## 2019-10-12 ENCOUNTER — Emergency Department: Payer: Medicare PPO

## 2019-10-12 ENCOUNTER — Other Ambulatory Visit: Payer: Self-pay

## 2019-10-12 ENCOUNTER — Emergency Department
Admission: EM | Admit: 2019-10-12 | Discharge: 2019-10-12 | Disposition: A | Discharge status: Home / Self Care | Payer: Medicare PPO | Attending: Emergency Medicine | Admitting: Emergency Medicine

## 2019-10-12 ENCOUNTER — Encounter: Payer: Self-pay | Admitting: Emergency Medicine

## 2019-10-12 DIAGNOSIS — R9082 White matter disease, unspecified: Secondary | ICD-10-CM | POA: Insufficient documentation

## 2019-10-12 DIAGNOSIS — S22070D Wedge compression fracture of T9-T10 vertebra, subsequent encounter for fracture with routine healing: Secondary | ICD-10-CM

## 2019-10-12 DIAGNOSIS — J0110 Acute frontal sinusitis, unspecified: Secondary | ICD-10-CM | POA: Insufficient documentation

## 2019-10-12 DIAGNOSIS — J90 Pleural effusion, not elsewhere classified: Secondary | ICD-10-CM

## 2019-10-12 DIAGNOSIS — Z7982 Long term (current) use of aspirin: Secondary | ICD-10-CM | POA: Diagnosis not present

## 2019-10-12 DIAGNOSIS — R0789 Other chest pain: Secondary | ICD-10-CM | POA: Diagnosis not present

## 2019-10-12 DIAGNOSIS — W01198A Fall on same level from slipping, tripping and stumbling with subsequent striking against other object, initial encounter: Secondary | ICD-10-CM | POA: Diagnosis not present

## 2019-10-12 DIAGNOSIS — G9389 Other specified disorders of brain: Secondary | ICD-10-CM | POA: Insufficient documentation

## 2019-10-12 DIAGNOSIS — S22070A Wedge compression fracture of T9-T10 vertebra, initial encounter for closed fracture: Secondary | ICD-10-CM | POA: Diagnosis not present

## 2019-10-12 DIAGNOSIS — Z981 Arthrodesis status: Secondary | ICD-10-CM | POA: Diagnosis not present

## 2019-10-12 DIAGNOSIS — Y929 Unspecified place or not applicable: Secondary | ICD-10-CM | POA: Diagnosis not present

## 2019-10-12 DIAGNOSIS — Y939 Activity, unspecified: Secondary | ICD-10-CM | POA: Insufficient documentation

## 2019-10-12 DIAGNOSIS — M545 Low back pain: Secondary | ICD-10-CM | POA: Insufficient documentation

## 2019-10-12 DIAGNOSIS — I5032 Chronic diastolic (congestive) heart failure: Secondary | ICD-10-CM | POA: Insufficient documentation

## 2019-10-12 DIAGNOSIS — W19XXXA Unspecified fall, initial encounter: Secondary | ICD-10-CM

## 2019-10-12 DIAGNOSIS — Y999 Unspecified external cause status: Secondary | ICD-10-CM | POA: Insufficient documentation

## 2019-10-12 DIAGNOSIS — S24109A Unspecified injury at unspecified level of thoracic spinal cord, initial encounter: Secondary | ICD-10-CM | POA: Diagnosis present

## 2019-10-12 DIAGNOSIS — H748X3 Other specified disorders of middle ear and mastoid, bilateral: Secondary | ICD-10-CM | POA: Insufficient documentation

## 2019-10-12 DIAGNOSIS — G311 Senile degeneration of brain, not elsewhere classified: Secondary | ICD-10-CM | POA: Diagnosis not present

## 2019-10-12 MED ORDER — LIDOCAINE 5 % EX PTCH: 1.0000 | MEDICATED_PATCH | Freq: Two times a day (BID) | CUTANEOUS | 1 refills | Status: AC

## 2019-10-12 MED ORDER — HYDROCODONE-ACETAMINOPHEN 5-325 MG PO TABS
1.0000 | ORAL_TABLET | Freq: Once | ORAL | Status: AC
  Administered 2019-10-12: 1 via ORAL
  Filled 2019-10-12: qty 1

## 2019-10-12 MED ORDER — TRAMADOL HCL 50 MG PO TABS
50.0000 mg | ORAL_TABLET | Freq: Once | ORAL | Status: AC
  Administered 2019-10-12: 50 mg via ORAL
  Filled 2019-10-12: qty 1

## 2019-10-12 MED ORDER — LIDOCAINE 5 % EX PTCH
1.0000 | MEDICATED_PATCH | CUTANEOUS | Status: DC
  Administered 2019-10-12: 1 via TRANSDERMAL
  Filled 2019-10-12: qty 1

## 2019-10-12 NOTE — ED Triage Notes (Signed)
Patient presents to Emergency Department via [facility] from home with complaints of fall.  Pt reports transferring from recliner to wheelchair to go to toilet when fell back onto the recliner.  Pt reports pain in right lower back (hx of same), right rib and hip tenderness.  Pt denies pain with palpation to head and neck.  Pt denies head strike, LOC or blood thinners.

## 2019-10-12 NOTE — ED Provider Notes (Signed)
[facility] Emergency Department Provider Note  ____________________________________________   First MD Initiated Contact with Patient 10/12/19 [phone]     (approximate)  I have reviewed the triage vital signs and the nursing notes.   HISTORY  Chief Complaint Fall    HPI Katie Harding is a 79 y.o. female with below list of previous medical conditions including CHF breast cancer osteoporosis presents to the emergency department following accidental fall while transferring from bed to wheelchair with resultant right lower back/side pain.  Patient states that current pain score 7 out of 10.  Patient denies any head injury however EMS states that the family states that the patient did hit her head.  Patient denies any loss of consciousness.  Patient denies any neck pain.        Past Medical History:  Diagnosis Date  . #[id number] 2006  . Anxiety   . Arrhythmia    atrial fibrillation  . Arthritis    osteoporosis  . Breast cancer [facility]) 2006   right breast lumpectomy with radiation. no lymph nodes involved  . CHF (congestive heart failure) ([place])   . Colon polyp    adenoma  . Complication of anesthesia   . Constipation 04/2019   on a new bowel regime  . Dyspnea   . Dysrhythmia    ATRIAL FIBRILLATION  . Fracture of vertebra due to osteoporosis (HCC)    history of L1, L2 and now T12  . GERD (gastroesophageal reflux disease) 04/04/2015  . Heart disease   . History of gastric ulcer 04/2001  . History of right breast cancer    post surgery/ XRT  . MVP (mitral valve prolapse)    dental prophylaxis  . Osteoporosis   . Osteoporosis, post-menopausal   . Personal history of radiation therapy 2006   right breast ca  . PONV (postoperative nausea and vomiting)    NAUSEA ON SEVERAL OCCASIONS  . Scoliosis   . Surgical menopause     Patient Active Problem List   Diagnosis Date Noted  . Accidental fall 09/20/2019  . Acute pain of left shoulder due to  trauma 09/20/2019  . Frequent falls 09/20/2019  . Multiple fractures 09/20/2019  . Frailty 09/20/2019  . Chronic diastolic CHF (congestive heart failure), NYHA class 3 ([place]) 07/17/2019  . Nonspecific chest pain 07/08/2019  . Paroxysmal atrial fibrillation ([place]) 07/08/2018  . Protein-calorie malnutrition, severe 01/30/2017  . Hyponatremia 01/28/2017  . Neuropathy 09/19/2016  . Urinary retention 09/19/2016  . Closed fracture of multiple pubic rami ([place]) 08/20/2016  . GERD (gastroesophageal reflux disease) 04/04/2015  . Hypokalemia 04/04/2015  . Acute CHF (congestive heart failure) ([place]) 04/04/2015    Past Surgical History:  Procedure Laterality Date  . BREAST BIOPSY Right 2006   breast ca  . BREAST LUMPECTOMY Right 2006   breast ca  . CATARACT EXTRACTION    . CHOLECYSTECTOMY    . 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 COLONOSCOPY  11/29/2004   PH Adenomatous Polyps, [facility] (Brother)  . COLONOSCOPY  03/14/2010   PH Adenomatous Polyps, [facility] (Brother); CBF 03/2015, Ltr mailed 01/19/2015 (dw)  . COLONOSCOPY  05/05/2015   PH Adenomatous Polyps, [facility] (Brother); No repeat due to age per RTE (dw)  . COLONOSCOPY WITH PROPOFOL N/A 05/05/2015   Procedure: COLONOSCOPY WITH PROPOFOL;  Surgeon: Manya Silvas, MD;  Location: [facility] ENDOSCOPY;  Service: Endoscopy;  Laterality: N/A;  . ESOPHAGOGASTRODUODENOSCOPY  12/24/2006   01/06/2004, 08/27/2001, 06/18/2001  . ESOPHAGOGASTRODUODENOSCOPY (  EGD) WITH PROPOFOL N/A 05/05/2015   Procedure: ESOPHAGOGASTRODUODENOSCOPY (EGD) WITH PROPOFOL;  Surgeon: Manya Silvas, MD;  Location: [facility] ENDOSCOPY;  Service: Endoscopy;  Laterality: N/A;  . EYE SURGERY Bilateral    cataracts removed  . KYPHOPLASTY N/A 06/08/2019   Procedure: T12 KYPHOPLASTY;  Surgeon: Hessie Knows, MD;  Location: [facility] ORS;  Service: Orthopedics;  Laterality: N/A;  . KYPHOPLASTY N/A 06/30/2019   Procedure: T 11 KYPHOPLASTY;  Surgeon: Hessie Knows, MD;  Location: [facility]  ORS;  Service: Orthopedics;  Laterality: N/A;  . NASAL SEPTUM SURGERY    . NASAL SEPTUM SURGERY    . ovarian cyst removed    . ROTATOR CUFF REPAIR Right   . TONSILLECTOMY    . TONSILLECTOMY    . TOTAL ABDOMINAL HYSTERECTOMY W/ BILATERAL SALPINGOOPHORECTOMY  1980s   for endometriosis in 1980s    Prior to Admission medications   Medication Sig Start Date End Date Taking? Authorizing Provider  acetaminophen (TYLENOL) 325 MG tablet Take 650 mg by mouth every 4 (four) hours as needed. For pain / increased temp Max dose for 24 hrs is 3000 mg from all sources of Apap/tyenol    [provider]  ALPRAZolam (XANAX) 0.25 MG tablet Take 0.5 tablets (0.125 mg total) by mouth 4 (four) times daily. 08/24/16   Toni Arthurs, NP  Amino Acids-Protein Hydrolys (FEEDING SUPPLEMENT, PRO-STAT SUGAR FREE 64,) LIQD Take 30 mLs by mouth 2 (two) times daily between meals.    [provider]  aspirin EC 81 MG EC tablet Take 1 tablet (81 mg total) by mouth daily. Swallow whole. 07/11/19   Lorella Nimrod, MD  atorvastatin (LIPITOR) 40 MG tablet Take 1 tablet (40 mg total) by mouth daily. 07/11/19   Lorella Nimrod, MD  bisacodyl (DULCOLAX) 5 MG EC tablet Take 5 mg by mouth daily as needed for moderate constipation.    [provider]  calcium citrate-vitamin D (CITRACAL+D) 315-200 MG-UNIT tablet Take 2 tablets by mouth 2 (two) times daily.     [provider]  Cholecalciferol 1000 units capsule Take 2,000 Units by mouth daily. 2 caps    [provider]  co-enzyme Q-10 30 MG capsule Take 30 mg by mouth daily.    [provider]  diltiazem (CARDIZEM CD) 120 MG 24 hr capsule Take 120 mg by mouth daily.    [provider]  furosemide (LASIX) 40 MG tablet Take 0.5 tablets (20 mg total) by mouth daily for 20 days. 09/19/19 10/09/19  Carrie Mew, MD  HYDROcodone-acetaminophen (NORCO/VICODIN) 5-325 MG tablet Take 1 tablet by mouth every 6 (six) hours as needed for  moderate pain.    [provider]  magnesium oxide (MAG-OX) 400 MG tablet Take 400 mg by mouth 2 (two) times daily.    [provider]  metoCLOPramide (REGLAN) 5 MG tablet Take 5 mg by mouth 3 (three) times daily. 06/01/19 09/20/19  [provider]  Multiple Vitamin (MULTIVITAMIN) tablet Take 1 tablet by mouth daily.    [provider]  omeprazole (PRILOSEC) 40 MG capsule Take 40 mg by mouth 2 (two) times daily.    [provider]  polyethylene glycol (MIRALAX / GLYCOLAX) packet Take 17 g by mouth daily.     [provider]  potassium chloride SA (KLOR-CON) 20 MEQ tablet Take 20 mEq by mouth once.    [provider]  propranolol (INDERAL) 40 MG tablet Take 40 mg by mouth 3 (three) times daily.    [provider]  sucralfate (CARAFATE) 1 g tablet Take 1 g by mouth 3 (three) times daily. 04/16/19   [provider]  TYMLOS 3120 MCG/1.56ML SOPN Inject into the skin. 09/10/19   [provider]    Allergies Nsaids and Hydrochlorothiazide  Family History  Problem Relation Age of Onset  . Breast cancer Sister 29  . Multiple myeloma Sister   . Breast cancer Maternal Aunt 60  . Breast cancer Maternal Aunt 60  . Colon cancer Brother   . Intracerebral hemorrhage Mother   . CVA Mother   . Hypertension Mother   . Stroke Mother   . Basal cell carcinoma Mother   . Melanoma Neg Hx     Social History Social History   Tobacco Use  . Smoking status: Never Smoker  . Smokeless tobacco: Never Used  Vaping Use  . Vaping Use: Never used  Substance Use Topics  . Alcohol use: No  . Drug use: No    Review of Systems Constitutional: No fever/chills Eyes: No visual changes. ENT: No sore throat. Cardiovascular: Denies chest pain. Respiratory: Denies shortness of breath. Gastrointestinal: No abdominal pain.  No nausea, no vomiting.  No diarrhea.  No constipation. Genitourinary: Negative for  dysuria. Musculoskeletal: Positive for right chest/right lower back pain Integumentary: Negative for rash. Neurological: Negative for headaches, focal weakness or numbness.   ____________________________________________   PHYSICAL EXAM:  VITAL SIGNS: ED Triage Vitals [10/12/19 0515]  Enc Vitals Group     BP (!) 158/88     Pulse Rate 64     Resp 18     Temp 97.7 F (36.5 C)     Temp Source Oral     SpO2 98 %     Weight      Height      Head Circumference      Peak Flow      Pain Score      Pain Loc      Pain Edu?      Excl. in [place]?     Constitutional: Alert and oriented.  Eyes: Conjunctivae are normal.  Head: Atraumatic. Mouth/Throat: Patient is wearing a mask. Neck: No stridor.  No meningeal signs.   Cardiovascular: Normal rate, regular rhythm. Good peripheral circulation. Grossly normal heart sounds. Chest: Pain with right lateral chest wall palpation. Respiratory: Normal respiratory effort.  No retractions. Gastrointestinal: Soft and nontender. No distention.  Musculoskeletal: No lower extremity tenderness nor edema. No gross deformities of extremities. Neurologic:  Normal speech and language. No gross focal neurologic deficits are appreciated.  Skin:  Skin is warm, dry and intact. Psychiatric: Mood and affect are normal. Speech and behavior are normal.  ____________________________________________   LABS (all labs ordered are listed, but only abnormal results are displayed)  Labs Reviewed - No data to display ____________________________________________   RADIOLOGY I, [place], personally viewed and evaluated these images (plain radiographs) as part of my medical decision making, as well as reviewing the written report by the radiologist.  ED MD interpretation:    Official radiology report(s): DG Ribs Unilateral W/Chest Left  Result Date: 10/12/2019 CLINICAL DATA:  Fall with pain. EXAM: LEFT RIBS AND CHEST - 3+ VIEW COMPARISON:  Chest CT  09/20/2019 FINDINGS: Numerous left rib fractures involving 6, 7, and 8 posteriorly with callus. Laterally at ribs 7 and 8 there is segmental fracturing with angulation and callus. No visible superimposed fracture. Small pleural effusions and large lung volumes. No visible pneumothorax. Biapical pleural based scarring. Cement emboli to the  right lower lobe. IMPRESSION: 1. Multiple nonacute left rib fractures as seen by recent chest CT. No visible acute fracture. 2. Small chronic pleural effusions. Electronically Signed   By: Monte Fantasia M.D.   On: 10/12/2019 06:42   DG Thoracic Spine 2 View  Result Date: 10/12/2019 CLINICAL DATA:  [place]. EXAM: THORACIC SPINE 2 VIEWS COMPARISON:  Chest CT 09/20/2019 FINDINGS: There is a new mild compression fracture T10. Stable compression fractures of T11 and T12 with vertebral augmentation changes. The other thoracic vertebral bodies are maintained. Stable thoracic scoliosis. There are remote healed left posterior rib fractures and suspected new lower left rib fractures. Small left pleural effusion is noted. No pneumothorax. IMPRESSION: 1. New mild compression fracture of T10. 2. Stable compression fractures of T11 and T12 with vertebral augmentation changes. 3. Suspect new lower left rib fractures. Electronically Signed   By: Marijo Sanes M.D.   On: 10/12/2019 06:47   DG Lumbar Spine Complete  Result Date: 10/12/2019 CLINICAL DATA:  Back pain. Fell. EXAM: LUMBAR SPINE - COMPLETE 4+ VIEW COMPARISON:  Lumbar radiographs 09/19/2018 FINDINGS: Stable vertebral augmentation changes noted at T11, T12, L1, L2 and L3. The L4 and L5 vertebral bodies are intact. No acute fractures. Stable scoliosis and degenerative lumbar spondylosis. The visualized bony pelvis is intact. Remote healed appearing left lower rib fractures are noted. IMPRESSION: 1. Stable vertebral augmentation changes at T11, T12, L1, L2 and L3. 2. No acute bony findings. 3. Remote appearing healed left lower rib  fractures. Electronically Signed   By: Marijo Sanes M.D.   On: 10/12/2019 06:51   CT Head Wo Contrast  Result Date: 10/12/2019 CLINICAL DATA:  [place]. Hit head. EXAM: CT HEAD WITHOUT CONTRAST TECHNIQUE: Contiguous axial images were obtained from the base of the skull through the vertex without intravenous contrast. COMPARISON:  None. FINDINGS: Brain: Age related cerebral atrophy, ventriculomegaly and periventricular white matter disease. No extra-axial fluid collections are identified. No CT findings for acute hemispheric infarction or intracranial hemorrhage. No mass lesions. The brainstem and cerebellum are normal. Vascular: There are vascular calcifications but no aneurysm or hyperdense vessels. Skull: No skull fracture is identified. No bone lesions. Sinuses/Orbits: Air-fluid levels in the frontal sinuses consistent with acute sinusitis. There is also fairly extensive ethmoid sinus disease and sphenoid sinus disease. The visualized maxillary sinuses are grossly clear. Scattered bilateral mastoid effusions. The middle ear cavities are clear. The globes are intact. Other: No scalp lesions or scalp hematoma. No obvious lacerations. IMPRESSION: 1. Age related cerebral atrophy, ventriculomegaly and periventricular white matter disease. 2. No acute intracranial findings or skull fracture. 3. Paranasal sinus disease with acute frontal sinusitis and bilateral mastoid effusions. Electronically Signed   By: Marijo Sanes M.D.   On: 10/12/2019 06:08    ____________________________________________   PROCEDURES   Procedure(s) performed (including Critical Care):  Procedures   ____________________________________________   INITIAL IMPRESSION / MDM / [place] / ED COURSE  As part of my medical decision making, I reviewed the following data within the electronic MEDICAL RECORD NUMBER  79 year old female presented with above-stated history and physical exam follow mechanical fall with concern for  possible skeletal injury.  X-ray revealed possible new mild T10 compression fracture.  With stable compression fractures of T11 and T12.  Also concern for possible new left lower rib fractures on thoracic spine x-ray which were not depicted on rib x-rays.  As such CT scan of the chest ordered.  Patient was given tramadol on arrival and on reevaluation  patient states pain resolved at present.  Patient's care transferred to Dr. Charna Archer      ____________________________________________  FINAL CLINICAL IMPRESSION(S) / ED DIAGNOSES  Final diagnoses:  Fall, initial encounter  Closed wedge compression fracture of T10 vertebra with routine healing, subsequent encounter  Pleural effusion     MEDICATIONS GIVEN DURING THIS VISIT:  Medications  traMADol (ULTRAM) tablet 50 mg (50 mg Oral Given 10/12/19 5956)     ED Discharge Orders    None      *Please note:  MAYSON STERBENZ was evaluated in Emergency Department on 10/12/2019 for the symptoms described in the history of present illness. She was evaluated in the context of the global COVID-19 pandemic, which necessitated consideration that the patient might be at risk for infection with the SARS-CoV-2 virus that causes COVID-19. Institutional protocols and algorithms that pertain to the evaluation of patients at risk for COVID-19 are in a state of rapid change based on information released by regulatory bodies including the CDC and federal and state organizations. These policies and algorithms were followed during the patient's care in the ED.  Some ED evaluations and interventions may be delayed as a result of limited staffing during and after the pandemic.*  Note:  This document was prepared using Dragon voice recognition software and may include unintentional dictation errors.   Gregor Hams, MD 10/12/19 [phone]

## 2019-10-12 NOTE — ED Notes (Signed)
E sign not working. Pt and family member verbalized understanding of d/c instructions. No further questions at this time.

## 2019-10-12 NOTE — ED Notes (Signed)
duoderm applied to pt back.

## 2019-10-12 NOTE — ED Notes (Signed)
Patient transported to X-ray 

## 2019-10-12 NOTE — ED Notes (Signed)
Pt back from DG att, now reports left knee pain, Dr Owens Shark aware, pt and family updated on CT results

## 2019-10-12 NOTE — ED Notes (Signed)
Family at bedside. 

## 2019-10-12 NOTE — ED Notes (Signed)
Pt to CT at this time.

## 2019-10-12 NOTE — ED Provider Notes (Signed)
-----------------------------------------   7:11 AM on 10/12/2019 -----------------------------------------  Blood pressure (!) 165/90, pulse 61, temperature 97.7 F (36.5 C), temperature source Oral, resp. rate 18, height 5\' 6"  (1.676 m), weight 56.7 kg, SpO2 97 %.  Assuming care from Dr. Owens Shark.  In short, Katie Harding is a 80 y.o. female with a chief complaint of Fall .  Refer to the original H&P for additional details.  The current plan of care is to follow-up CT chest for possible rib fractures.  ----------------------------------------- 9:16 AM on 10/12/2019 -----------------------------------------  CT chest redemonstrates known T10 compression fracture with multiple healing bilateral rib fractures but no evidence of acute rib fracture.  Patient reports some ongoing pain and takes hydrocodone regularly for her back issues, we will give her usual morning dose of this and also place Lidoderm patch.  Pain is reasonably well controlled at this time and near her baseline, so she will be appropriate for discharge home with orthopedic follow-up for potential kyphoplasty.  She does also have slightly enlarged bilateral pleural effusions, similar to prior.  She denies any difficulty breathing at this time and effusions are likely related to her chronic CHF.  She was advised to follow-up with her PCP for this as well as potential atypical infection, but antibiotics do not seem indicated at this time as she has had no fevers, cough, chest pain, or shortness of breath.  She was counseled to return to the ED for new worsening symptoms, patient and daughter agree with plan.    Blake Divine, MD 10/12/19 [phone]

## 2019-10-22 ENCOUNTER — Ambulatory Visit
Admission: RE | Admit: 2019-10-22 | Discharge: 2019-10-22 | Disposition: A | Discharge status: Home / Self Care | Payer: Medicare PPO | Source: Ambulatory Visit | Attending: Family Medicine | Admitting: Family Medicine

## 2019-10-22 ENCOUNTER — Other Ambulatory Visit: Payer: Self-pay | Admitting: Family Medicine

## 2019-10-22 ENCOUNTER — Other Ambulatory Visit: Payer: Self-pay

## 2019-10-22 DIAGNOSIS — M25551 Pain in right hip: Secondary | ICD-10-CM | POA: Insufficient documentation

## 2019-12-31 ENCOUNTER — Ambulatory Visit: Payer: Medicare PPO | Admitting: Physician Assistant

## 2021-03-14 ENCOUNTER — Other Ambulatory Visit: Payer: Self-pay

## 2021-05-06 IMAGING — CT CT L SPINE W/O CM
1 of 6 series · 7 of 14 positions shown, 9 images · non-contrast
Comparison: MRI lumbar spine 08/26/2018.

CLINICAL DATA: Severe low back pain for approximately 2 months. No
recent injury.

EXAM:
CT LUMBAR SPINE WITHOUT CONTRAST
TECHNIQUE: Multidetector CT imaging of the lumbar spine was performed without
intravenous contrast administration. Multiplanar CT image
reconstructions were also generated.

[Series 3: l spine soft · axial · 0.37mm/px · z∈[-998,-744]mm · 7 of 171 slices shown, 9 images]
[im 22/171  soft-tissue]
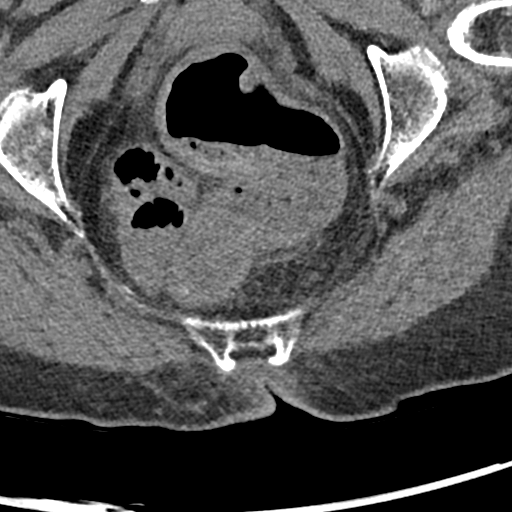
[im 22/171  bone]
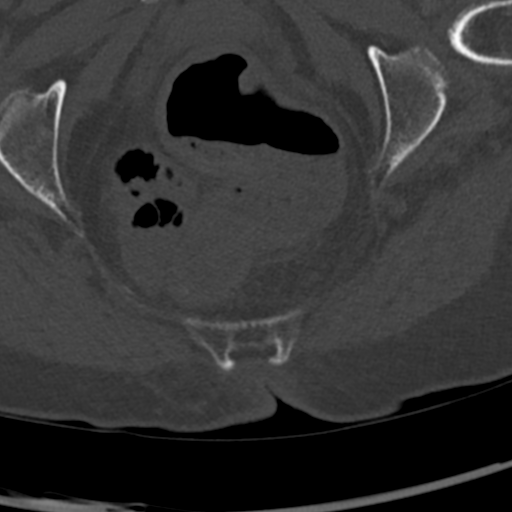
[im 43/171  bone]
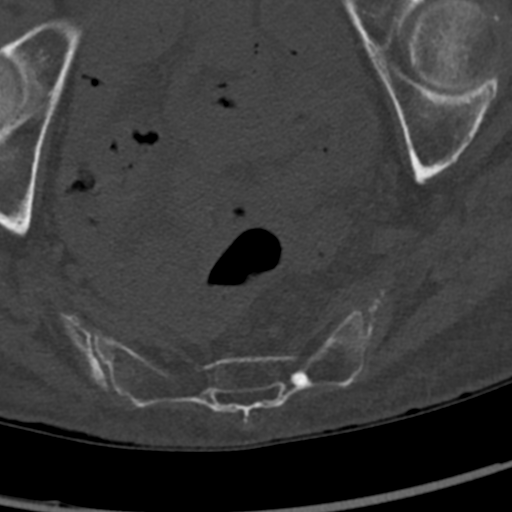
[im 64/171  bone]
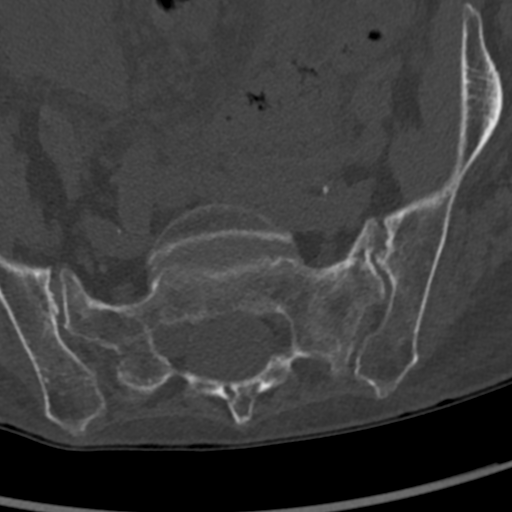
[im 86/171  bone]
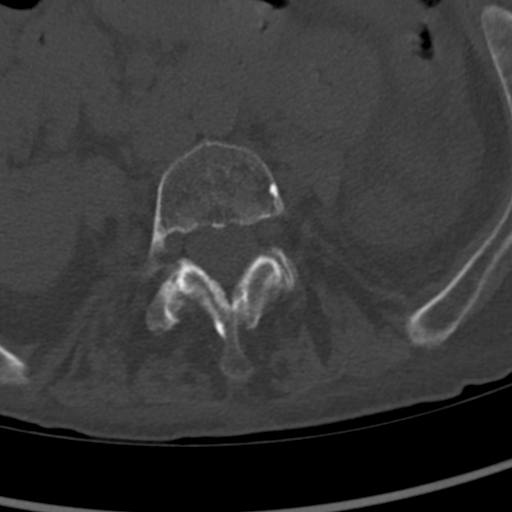
[im 107/171  soft-tissue]
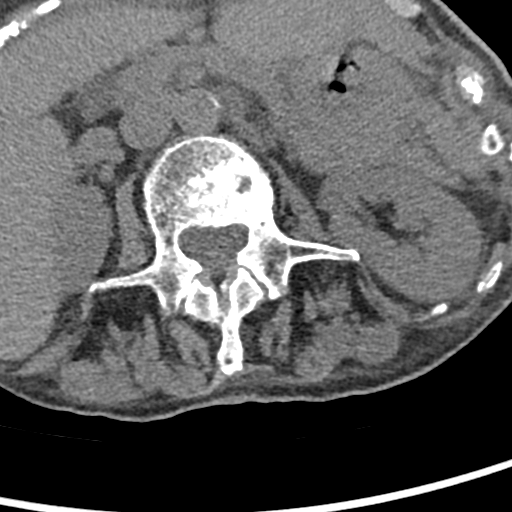
[im 107/171  bone]
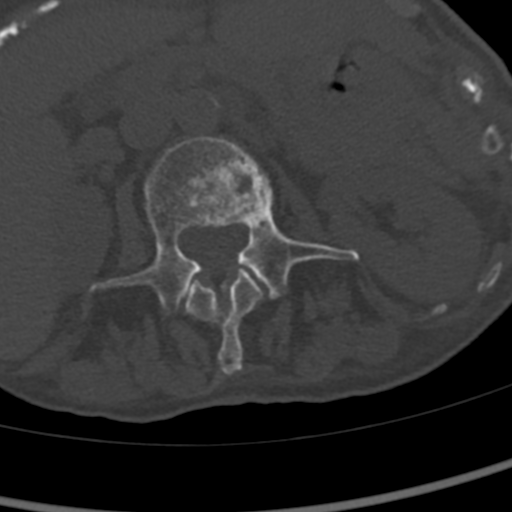
[im 128/171  bone]
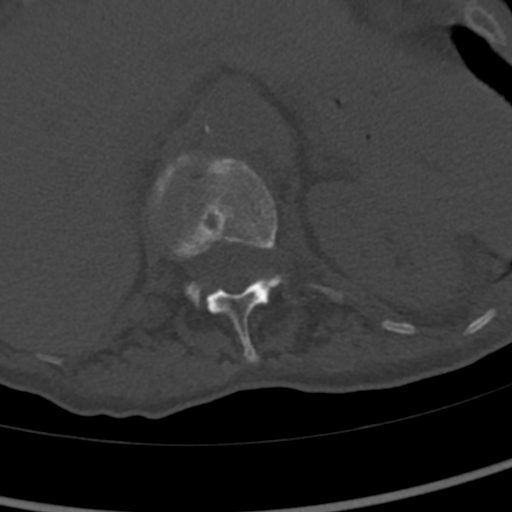
[im 149/171  bone]
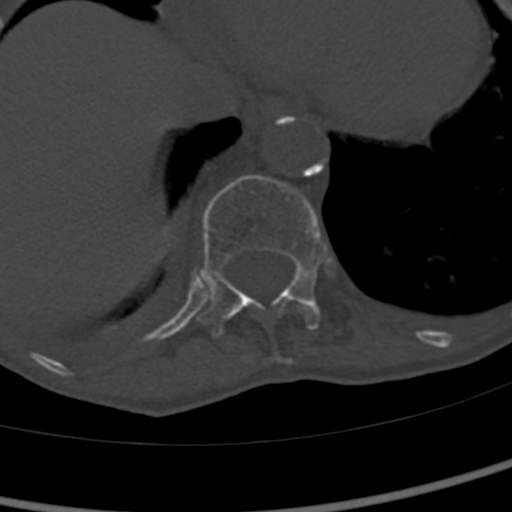

[7 of 14 positions shown; findings below may reference images not displayed]

FINDINGS: Segmentation: Standard.

Alignment: There is convex right scoliosis with the apex at L2-3.
0.3 cm anterolisthesis L4 on L5 and 0.5 cm anterolisthesis L5 on S1
are unchanged. Unilateral L5 pars interarticularis defects on the
left is seen.

Vertebrae: Since the prior MRI, the patient has suffered compression
fractures of the superior endplates of L1, L2 and L3. Vertebral body
height loss is worst at L1 where it is estimated at up 25%. The
superior endplate of L1 is fragmented. Remote sacral fracture is
identified. Bones appear osteopenic. No lytic or sclerotic lesion.

Paraspinal and other soft tissues: Small bilateral pleural effusions
are present, larger on the right. Atherosclerotic vascular disease
is noted.

Disc levels: T11-12 is imaged in the sagittal plane only and
negative.

T12-L1: There is bony retropulsion off the superior endplate of L1
more prominent to the left but the central spinal canal and neural
foramina appear open.

L1-2: Mild bony retropulsion off the superior endplate of L2 is
eccentric to the left. There is mild narrowing in the left
subarticular recess. The foramina are open.

L3-4: Minimal retropulsion off the superior endplate of L3. Vacuum
disc phenomenon is identified and there is some facet degenerative
change. The central canal and foramina appear open.

L3-4: Facet degenerative change, shallow disc bulge and ligamentum
flavum thickening. Mild loss of disc space height and vacuum disc
phenomenon noted. The central canal and foramina appear open.

L4-5: Vacuum disc phenomenon. No bulge or protrusion. The central
canal and foramina are open.

L5-S1: Mild disc uncovering and a shallow bulge.  No stenosis.
IMPRESSION: Since the comparison lumbar spine MRI 08/26/2018, the patient has
suffered superior endplate compression fractures of L1, L2 and L3.
Although the fractures cannot be definitively characterized,
fragmentation of the superior endplate of L1 suggests more recent
injury. Bony retropulsion off the superior endplates of the
vertebral bodies does not result in central canal stenosis. There is
mild narrowing in the left subarticular recess at L1-2.

Osteopenia.

Small bilateral pleural effusions, larger on the right.

Atherosclerosis.

## 2021-05-07 IMAGING — NM NM BONE WHOLE BODY
2 series · 2 of 2 positions shown · non-contrast
Comparison: None

Correlation CT lumbar spine 12/10/2018

CLINICAL DATA: Isthmic spondylolisthesis, breast cancer, back pain,
fell 2 years ago, spinal insufficiency, sacral fracture

EXAM:
NUCLEAR MEDICINE WHOLE BODY BONE SCAN
TECHNIQUE: Whole body anterior and posterior images were obtained approximately
3 hours after intravenous injection of radiopharmaceutical.
RADIOPHARMACEUTICALS:  21.5 mCi Hechnetium-BBm MDP IV

[Series 1: whole body · 2.66mm/px · 1 of 1 slices shown (1 of 2)]
[im 1/1]
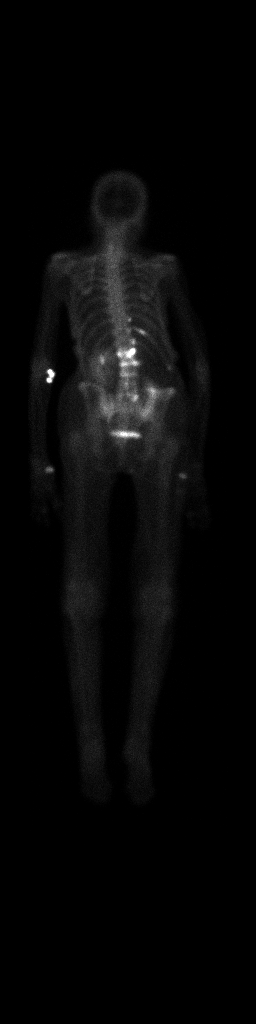

[Series 1: whole body · 2.66mm/px · 1 of 1 slices shown (2 of 2)]
[im 1/1]
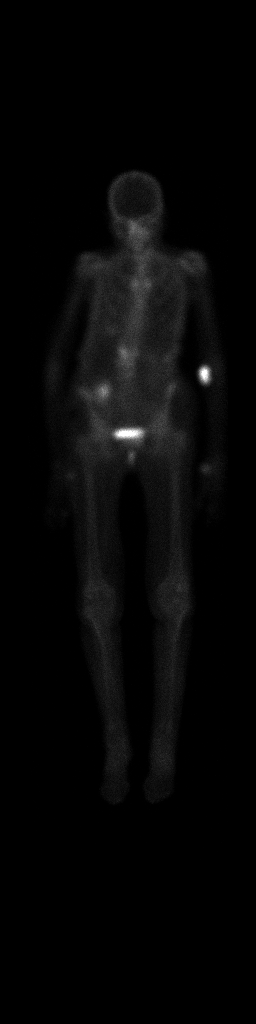

[2 of 2 positions shown; findings below may reference images not displayed]

FINDINGS: Uptake within L1, L2 and L3 vertebral bodies, corresponding to
superior endplate compression fractures on CT.

Uptake at costovertebral junctions of the RIGHT tenth eleventh and
twelfth ribs, could be posttraumatic or degenerative.

Uptake at the posterior RIGHT eleventh rib, of uncertain etiology;
no definite fracture or metastatic lesion seen by CT.

Additional uptake identified at the distal aspect of the RIGHT
eleventh rib.

Deformity of the superior aspect of the RIGHT iliac crest of
uncertain etiology, not included within CT lumbar spine field of
view and unremarkable on a remote CT pelvic exam from 3700.

Associated photopenia at the RIGHT sacrum, no abnormalities by CT.

Uptake at RIGHT L5-S1 corresponding to facet degenerative changes.

Uptake at the shoulders, sternoclavicular joints, knees, wrists,
hips, typically degenerative.

No additional worrisome sites of osseous tracer uptake are seen.

Dextroconvex lumbar scoliosis.

Ptotic RIGHT kidney.

Urinary tract and soft tissue distribution of tracer otherwise
unremarkable.
IMPRESSION: Uptake at superior endplate compression fractures of L1, L2 and L3
vertebral bodies.

Abnormal uptake at 2 sites in the RIGHT eleventh rib, could be
related to trauma, other etiologies including metastatic disease not
excluded.

Deformity of the superior RIGHT iliac bone with relative photopenia
on bone scintigraphy involving the RIGHT sacrum, both of uncertain
etiology; consider assessment by MR.

## 2023-12-05 ENCOUNTER — Other Ambulatory Visit
Admission: RE | Admit: 2023-12-05 | Discharge: 2023-12-05 | Disposition: A | Discharge status: Home / Self Care | Source: Ambulatory Visit | Attending: Family Medicine | Admitting: Family Medicine

## 2023-12-05 DIAGNOSIS — R55 Syncope and collapse: Secondary | ICD-10-CM | POA: Insufficient documentation

## 2023-12-05 DIAGNOSIS — R1013 Epigastric pain: Secondary | ICD-10-CM | POA: Insufficient documentation

## 2023-12-05 LAB — TROPONIN I (HIGH SENSITIVITY): Troponin I (High Sensitivity): 6 ng/L (ref <18)

## 2023-12-24 ENCOUNTER — Other Ambulatory Visit: Payer: Self-pay | Admitting: Family Medicine

## 2023-12-24 DIAGNOSIS — Z853 Personal history of malignant neoplasm of breast: Secondary | ICD-10-CM

## 2023-12-24 DIAGNOSIS — N644 Mastodynia: Secondary | ICD-10-CM

## 2024-01-02 ENCOUNTER — Ambulatory Visit
Admission: RE | Admit: 2024-01-02 | Discharge: 2024-01-02 | Disposition: A | Source: Ambulatory Visit | Attending: Family Medicine | Admitting: Family Medicine

## 2024-01-02 DIAGNOSIS — N644 Mastodynia: Secondary | ICD-10-CM

## 2024-01-02 DIAGNOSIS — Z853 Personal history of malignant neoplasm of breast: Secondary | ICD-10-CM | POA: Diagnosis present

## 2024-01-03 ENCOUNTER — Other Ambulatory Visit: Payer: Self-pay | Admitting: Family Medicine

## 2024-01-03 DIAGNOSIS — R928 Other abnormal and inconclusive findings on diagnostic imaging of breast: Secondary | ICD-10-CM

## 2024-01-15 ENCOUNTER — Inpatient Hospital Stay: Admission: RE | Admit: 2024-01-15 | Discharge: 2024-01-15 | Attending: Family Medicine | Admitting: Family Medicine

## 2024-01-15 DIAGNOSIS — R928 Other abnormal and inconclusive findings on diagnostic imaging of breast: Secondary | ICD-10-CM

## 2024-01-15 DIAGNOSIS — N62 Hypertrophy of breast: Secondary | ICD-10-CM | POA: Diagnosis present

## 2024-01-15 HISTORY — PX: BREAST BIOPSY: SHX20

## 2024-01-15 MED ORDER — LIDOCAINE 1 % OPTIME INJ - NO CHARGE
2.0000 mL | Freq: Once | INTRAMUSCULAR | Status: AC
  Administered 2024-01-15: 08:00:00 2 mL
  Filled 2024-01-15: qty 2

## 2024-01-15 MED ORDER — LIDOCAINE-EPINEPHRINE 1 %-1:100000 IJ SOLN
5.0000 mL | Freq: Once | INTRAMUSCULAR | Status: AC
  Administered 2024-01-15: 08:00:00 5 mL

## 2024-01-16 LAB — SURGICAL PATHOLOGY

## 2024-02-03 NOTE — Progress Notes (Signed)
 "                                    Patient Profile:   Katie Harding  is a 84 y.o.  female Chief Complaint  Patient presents with   Follow-up    Per niece, ignacia boots removed Friday. Legs and feet seemed better initially. They are now purple again.  States she discussed afib with [facility]. Recent ECHO.  She reports feeling shaky.      PROBLEM LIST: Past Medical History:  Diagnosis Date   Colon polyp    adenoma   COVID-19 04/23/2022   Gastroesophageal reflux disease 04/04/2015   GERD (gastroesophageal reflux disease)    H/O gastric ulcer 04/2001   History of right breast cancer    post surgery/XRT   MVP (mitral valve prolapse)    dental prophylaxis   Osteoporosis, post-menopausal    Paroxysmal atrial fibrillation (CMS/HHS-HCC) 07/08/2018   Surgical menopause     Past Surgical History:  Procedure Laterality Date   COLONOSCOPY  12/15/1999   Tubulovillous Adenoma, [facility] (Brother)   COLONOSCOPY  06/18/2001   Adenomatous Polyp, [facility] (Brother)   COLONOSCOPY  11/29/2004   PH Adenomatous Polyps, [facility] (Brother)   EGD  12/24/2006   01/06/2004, 08/27/2001, 06/18/2001   COLONOSCOPY  03/14/2010   PH Adenomatous Polyps, [facility] (Brother): CBF 03/2015; Recall Ltr mailed 01/19/2015 (dw)   COLONOSCOPY  05/05/2015   PH Adenomatous Polyps, [facility] (Brother): No repeat due to age per RTE (dw)   KYPHOPLASTY N/A 06/08/2019   Dr. Kathlynn (T12)   KYPHOPLASTY N/A 06/30/2019   Dr. Kathlynn (T11)   cataract surgery     CHOLECYSTECTOMY     DEVIATED SEPTUM SURGERY     HYSTERECTOMY     TAH/BSO for endometriosis in the 1980s   KYPHOPLASTY     3 vertebra Dr. Malcolm on 12/29/2018   MASTECTOMY PARTIAL / LUMPECTOMY     REMOVAL OVARIAN CYST     ROTATOR CUFF REPAIR Right    TONSILLECTOMY      ALLERGIES: Allergies  Allergen Reactions   Nsaids (Non-Steroidal Anti-Inflammatory Drug) Unknown    Causes gastric burning, pain. History of gastric ulcer.   Hydrochlorothiazide  Other (See Comments)    Severe hyponatremia    CURRENT MEDICATIONS: Current Outpatient Medications  Medication Sig Dispense Refill   acetaminophen  (TYLENOL ) 500 mg capsule Take 500 mg by mouth every 6 (six) hours     aspirin  81 MG EC tablet Take 81 mg by mouth once daily        bisacodyL  (DULCOLAX) 5 mg EC tablet Take 5 mg by mouth once daily as needed for Constipation     cholecalciferol  (VITAMIN D3) 2,000 unit capsule Take 2,000 Units by mouth once daily.     clonazePAM (KLONOPIN) 0.5 MG tablet TAKE ONE TABLET BY MOUTH ONCE DAILY 90 tablet 1   magnesium  oxide (MAG-OX) 400 mg (241.3 mg magnesium ) tablet Take 1 tablet (400 mg total) by mouth once daily     metoclopramide  (REGLAN ) 5 MG tablet Take 0.5 tablets (2.5 mg total) by mouth 2 (two) times daily 90 tablet 3   metroNIDAZOLE (METROCREAM) 0.75 % cream APPLY TO AFFECTED AREA TWICE A DAY 45 g 3   nystatin (MYCOSTATIN) 100,000 unit/gram powder APPLY TO AFFECTED AREAS TOPICALLY TWICE DAILY AS DIRECTED 60 g 5   omeprazole (PRILOSEC) 40 MG DR capsule TAKE 1 CAPSULE BY MOUTH TWICE DAILY BEFORE  MEALS 180 capsule 3   polyethylene glycol (MIRALAX ) powder Take 17 g by mouth once daily Mix in 4-8ounces of fluid prior to taking.  Uses 1 1/2 doses daily       potassium chloride  (KLOR-CON  M10) 10 mEq ER tablet Take 3 tablets (30 mEq total) by mouth once daily 270 tablet 3   propranoloL  (INDERAL  LA) 120 MG 24 hr capsule Take 1 capsule (120 mg total) by mouth once daily 30 capsule 11   SODIUM CHLORIDE  ORAL Take by mouth One tab on Mon, Wed and Friday     spironolactone  (ALDACTONE ) 25 MG tablet TAKE 1 TABLET BY MOUTH DAILY 30 tablet 0   TORsemide (DEMADEX) 20 MG tablet TAKE 1 TABLET BY MOUTH DAILY 30 tablet 11   traMADoL  (ULTRAM ) 50 mg tablet Take 1 tablet (50 mg total) by mouth 3 (three) times a day (Patient taking differently: Take 50 mg by mouth 3 (three) times a day 0.5 tab TID) 90 tablet 5   denosumab (PROLIA) 60 mg/mL inj syringe  Inject 1 mL (60 mg total) subcutaneously once for 1 dose 1 mL 0   propranoloL  (INDERAL ) 20 MG tablet Take 2 tablets (40 mg total) by mouth once daily as needed 60 tablet 0   No current facility-administered medications for this visit.      HPI   CLINICAL SUMMARY:  Patient has been having profound issues with lower extremity swelling, found to be in A-fib and propranolol  dose increased.  Has not had A-fib in the last few days and she has kept her swelling out.  Her toes are purpuric, no pain  ROS: Review of systems is unremarkable for any active cardiac, respiratory, GI, GU, hematologic, neurologic, dermatologic, HEENT, or psychiatric symptoms except as noted above, 10 systems reviewed.  No fevers, chills, or constitutional symptoms.   PHYSICAL EXAM  Vital signs:  BP 120/80   Pulse 59   Wt 55.3 kg (122 lb)   LMP  (LMP Unknown)   SpO2 98%   BMI 20.30 kg/m  Body mass index is 20.3 kg/m.   Wt Readings from Last 3 Encounters:  02/03/24 55.3 kg (122 lb)  07/23/23 56.4 kg (124 lb 6.4 oz)  06/07/23 58.9 kg (129 lb 12.8 oz)     BP Readings from Last 3 Encounters:  02/03/24 120/80  01/27/24 130/80  01/20/24 110/60    Constitutional:NAD Neck: supple, no thyromegaly, good ROM Respiratory:clear to auscultation, no rales or wheezes Cardiovascular:RRR, no murmur or gallop Abdominal:soft, good BS, NT Ext: no edema, 1+ dorsal peripheral pulses Neuro: alert and oriented X 3, grossly nonfocal     ASSESSMENT/PLAN   Bilateral lower extremity edema-probably resolved, controlled being out of atrial fibrillation, that is the key, continue diuretics Paroxysmal A-fib-typically in the middle of the night, currently asymptomatic on higher dose propranolol .  She will have extra propranolol  she can take in the middle of the night if she were to have breakthrough symptoms Echo results pending Palliative care consult for evaluation and treatment Purpuric feet-does have 1+ dorsal pedal pulses,  no ischemic pain, feet color did resolve with elevation  Dispo:   Return in about 3 weeks (around 02/24/2024) for followup.     "

## 2024-02-05 NOTE — Progress Notes (Signed)
 ENCOUNTER: Patient Class :No patient class for patient encounter Department: [facility] CLINIC [address] [place] [address]  PATIENT: Patient Demographics      Name Patient ID SSN Gender Identity Birth Date   Katie, Harding [id number] Female 25-Mar-2040 (83 yrs)          Address Phone Email       [address] [place] [address] [phone] [email or web address]) [email or web address]            [place] Race         [place] Caucasian/White             Reg Status PCP Date Last Verified Next Review Date     Verified Katie Harding [phone] 02/03/24 03/04/24           Marital Status Religion Language       Widowed Christian English              EMERGENCY CONTACT: Name Relationship Lgl Grd Work Administrator, Sports  1. MORICLE,RHONDA Other    [phone]    GUARANTOR: There is no guarantor information entered for this encounter.  COVERAGE: Primary Visit Coverage      Payer Plan Group Number Group Name Payer Phone Plan Phone   No coverage found                Secondary Visit Coverage      Payer Plan Group Number Group Name Payer Phone Plan Phone   No coverage found                Primary Coverage      Payer Plan Group Number Group Name Payer Phone Plan Phone   [facility] MEDICARE Katie Harding [facility] CHOICE [id number]  [phone]           Primary Subscriber      Subscriber ID Subscriber Name Subscriber [facility] Subscriber Address   [id number] Katie Harding, Katie Harding [id number] [address]      [place], [place] [address]           Secondary Coverage      Payer Plan Group Number Group Name Payer Phone Plan Phone   No coverage found
